# Patient Record
Sex: Female | Born: 1950
Health system: Southern US, Community
[De-identification: ages and names within clinical notes are randomized; demographics above are authoritative.]

## PROBLEM LIST (undated history)

## (undated) DIAGNOSIS — K219 Gastro-esophageal reflux disease without esophagitis: Secondary | ICD-10-CM

## (undated) DIAGNOSIS — T7840XA Allergy, unspecified, initial encounter: Secondary | ICD-10-CM

## (undated) DIAGNOSIS — R7301 Impaired fasting glucose: Secondary | ICD-10-CM

## (undated) DIAGNOSIS — Z5189 Encounter for other specified aftercare: Secondary | ICD-10-CM

## (undated) DIAGNOSIS — R3129 Other microscopic hematuria: Secondary | ICD-10-CM

## (undated) DIAGNOSIS — IMO0001 Reserved for inherently not codable concepts without codable children: Secondary | ICD-10-CM

## (undated) DIAGNOSIS — C801 Malignant (primary) neoplasm, unspecified: Secondary | ICD-10-CM

## (undated) DIAGNOSIS — M1990 Unspecified osteoarthritis, unspecified site: Secondary | ICD-10-CM

## (undated) HISTORY — DX: Unspecified osteoarthritis, unspecified site: M19.90

## (undated) HISTORY — PX: BREAST SURGERY: SHX581

## (undated) HISTORY — PX: TUBAL LIGATION: SHX77

## (undated) HISTORY — DX: Allergy, unspecified, initial encounter: T78.40XA

## (undated) HISTORY — DX: Gastro-esophageal reflux disease without esophagitis: K21.9

## (undated) HISTORY — DX: Other microscopic hematuria: R31.29

## (undated) HISTORY — DX: Encounter for other specified aftercare: Z51.89

## (undated) HISTORY — PX: MASTECTOMY: SHX3

## (undated) HISTORY — DX: Malignant (primary) neoplasm, unspecified: C80.1

## (undated) HISTORY — DX: Impaired fasting glucose: R73.01

## (undated) HISTORY — DX: Reserved for inherently not codable concepts without codable children: IMO0001

---

## 1997-05-10 HISTORY — PX: OTHER SURGICAL HISTORY: SHX169

## 2001-07-10 HISTORY — PX: ABDOMINAL HYSTERECTOMY: SHX81

## 2005-05-11 LAB — HM MAMMOGRAPHY

## 2005-09-14 ENCOUNTER — Ambulatory Visit: Payer: Self-pay | Admitting: Family Medicine

## 2005-09-16 LAB — CONVERTED CEMR LAB
AST: 14 units/L
Cholesterol: 200 mg/dL
Triglycerides: 78 mg/dL

## 2005-09-28 ENCOUNTER — Ambulatory Visit: Payer: Self-pay | Admitting: Family Medicine

## 2005-10-17 ENCOUNTER — Ambulatory Visit: Payer: Self-pay | Admitting: Family Medicine

## 2005-10-18 DIAGNOSIS — N951 Menopausal and female climacteric states: Secondary | ICD-10-CM | POA: Insufficient documentation

## 2005-10-18 DIAGNOSIS — Z853 Personal history of malignant neoplasm of breast: Secondary | ICD-10-CM | POA: Insufficient documentation

## 2005-10-25 ENCOUNTER — Ambulatory Visit: Payer: Self-pay | Admitting: Family Medicine

## 2005-12-21 ENCOUNTER — Ambulatory Visit: Payer: Self-pay | Admitting: Family Medicine

## 2005-12-28 ENCOUNTER — Encounter: Admission: RE | Admit: 2005-12-28 | Discharge: 2006-03-28 | Payer: Self-pay | Admitting: Family Medicine

## 2006-01-05 ENCOUNTER — Encounter: Payer: Self-pay | Admitting: Family Medicine

## 2006-02-01 ENCOUNTER — Ambulatory Visit: Payer: Self-pay | Admitting: Family Medicine

## 2006-03-22 ENCOUNTER — Ambulatory Visit: Payer: Self-pay | Admitting: Family Medicine

## 2006-03-22 DIAGNOSIS — E119 Type 2 diabetes mellitus without complications: Secondary | ICD-10-CM

## 2006-03-22 DIAGNOSIS — E1169 Type 2 diabetes mellitus with other specified complication: Secondary | ICD-10-CM | POA: Insufficient documentation

## 2006-03-22 DIAGNOSIS — IMO0002 Reserved for concepts with insufficient information to code with codable children: Secondary | ICD-10-CM | POA: Insufficient documentation

## 2006-03-22 DIAGNOSIS — E1165 Type 2 diabetes mellitus with hyperglycemia: Secondary | ICD-10-CM | POA: Insufficient documentation

## 2006-03-22 LAB — CONVERTED CEMR LAB
Blood Glucose, Fasting: 122 mg/dL
Hgb A1c MFr Bld: 5.9 %

## 2006-04-10 ENCOUNTER — Ambulatory Visit: Payer: Self-pay | Admitting: Family Medicine

## 2006-04-10 LAB — CONVERTED CEMR LAB
Bilirubin Urine: NEGATIVE
Glucose, Urine, Semiquant: NEGATIVE
Nitrite: NEGATIVE
Urobilinogen, UA: NEGATIVE
WBC Urine, dipstick: NEGATIVE

## 2006-04-14 ENCOUNTER — Telehealth (INDEPENDENT_AMBULATORY_CARE_PROVIDER_SITE_OTHER): Payer: Self-pay | Admitting: *Deleted

## 2006-04-17 ENCOUNTER — Encounter: Payer: Self-pay | Admitting: Family Medicine

## 2006-04-24 ENCOUNTER — Ambulatory Visit: Payer: Self-pay | Admitting: Family Medicine

## 2006-04-24 DIAGNOSIS — R319 Hematuria, unspecified: Secondary | ICD-10-CM | POA: Insufficient documentation

## 2006-04-24 LAB — CONVERTED CEMR LAB
Ketones, urine, test strip: NEGATIVE
Nitrite: NEGATIVE
Specific Gravity, Urine: 1.02
WBC Urine, dipstick: NEGATIVE

## 2006-04-28 ENCOUNTER — Encounter: Payer: Self-pay | Admitting: Family Medicine

## 2006-05-04 ENCOUNTER — Encounter: Payer: Self-pay | Admitting: Family Medicine

## 2006-06-06 ENCOUNTER — Encounter: Payer: Self-pay | Admitting: Family Medicine

## 2006-06-06 LAB — CONVERTED CEMR LAB
Alkaline Phosphatase: 69 units/L
BUN: 16 mg/dL
Glucose, Bld: 96 mg/dL
Sodium: 141 meq/L
Total Bilirubin: 0.6 mg/dL
Total Protein: 7.5 g/dL

## 2006-06-23 ENCOUNTER — Ambulatory Visit: Payer: Self-pay | Admitting: Family Medicine

## 2006-09-22 ENCOUNTER — Ambulatory Visit: Payer: Self-pay | Admitting: Family Medicine

## 2006-09-22 LAB — CONVERTED CEMR LAB
Cholesterol: 191 mg/dL (ref 0–200)
Total CHOL/HDL Ratio: 3
Triglycerides: 88 mg/dL (ref <150)
VLDL: 18 mg/dL (ref 0–40)

## 2006-09-25 ENCOUNTER — Encounter: Payer: Self-pay | Admitting: Family Medicine

## 2006-11-17 ENCOUNTER — Telehealth: Payer: Self-pay | Admitting: Family Medicine

## 2006-11-17 DIAGNOSIS — H9319 Tinnitus, unspecified ear: Secondary | ICD-10-CM | POA: Insufficient documentation

## 2006-12-20 ENCOUNTER — Ambulatory Visit: Payer: Self-pay | Admitting: Family Medicine

## 2006-12-21 LAB — CONVERTED CEMR LAB
MCHC: 34.4 g/dL (ref 30.0–36.0)
Neutro Abs: 4.2 10*3/uL (ref 1.7–7.7)
Neutrophils Relative %: 58 % (ref 43–77)
RDW: 12.6 % (ref 11.5–15.5)

## 2007-01-29 ENCOUNTER — Ambulatory Visit: Payer: Self-pay | Admitting: Family Medicine

## 2007-03-20 ENCOUNTER — Ambulatory Visit: Payer: Self-pay | Admitting: Family Medicine

## 2007-04-11 ENCOUNTER — Encounter: Payer: Self-pay | Admitting: Family Medicine

## 2007-06-19 ENCOUNTER — Encounter: Payer: Self-pay | Admitting: Family Medicine

## 2007-06-19 LAB — CONVERTED CEMR LAB
Albumin: 4.1 g/dL
Alkaline Phosphatase: 56 units/L
CO2: 28 meq/L
Glucose, Bld: 113 mg/dL
Potassium: 4.2 meq/L
Sodium: 141 meq/L
Total Protein: 6.6 g/dL

## 2007-06-21 ENCOUNTER — Ambulatory Visit: Payer: Self-pay | Admitting: Family Medicine

## 2007-11-21 ENCOUNTER — Ambulatory Visit: Payer: Self-pay | Admitting: Family Medicine

## 2007-11-21 DIAGNOSIS — J069 Acute upper respiratory infection, unspecified: Secondary | ICD-10-CM | POA: Insufficient documentation

## 2007-11-28 ENCOUNTER — Telehealth: Payer: Self-pay | Admitting: Family Medicine

## 2007-12-20 ENCOUNTER — Ambulatory Visit: Payer: Self-pay | Admitting: Family Medicine

## 2007-12-20 DIAGNOSIS — R635 Abnormal weight gain: Secondary | ICD-10-CM | POA: Insufficient documentation

## 2007-12-20 LAB — CONVERTED CEMR LAB
Blood Glucose, Fasting: 126 mg/dL
Hgb A1c MFr Bld: 6.1 %
TSH: 2.084 microintl units/mL (ref 0.350–4.50)
Total CHOL/HDL Ratio: 2.9

## 2007-12-24 ENCOUNTER — Encounter: Payer: Self-pay | Admitting: Family Medicine

## 2008-03-26 ENCOUNTER — Ambulatory Visit: Payer: Self-pay | Admitting: Family Medicine

## 2008-03-26 DIAGNOSIS — J019 Acute sinusitis, unspecified: Secondary | ICD-10-CM | POA: Insufficient documentation

## 2008-04-01 ENCOUNTER — Telehealth (INDEPENDENT_AMBULATORY_CARE_PROVIDER_SITE_OTHER): Payer: Self-pay | Admitting: *Deleted

## 2008-04-14 ENCOUNTER — Ambulatory Visit: Payer: Self-pay | Admitting: Family Medicine

## 2008-04-14 DIAGNOSIS — J301 Allergic rhinitis due to pollen: Secondary | ICD-10-CM | POA: Insufficient documentation

## 2008-06-16 ENCOUNTER — Ambulatory Visit: Payer: Self-pay | Admitting: Family Medicine

## 2008-06-16 DIAGNOSIS — J309 Allergic rhinitis, unspecified: Secondary | ICD-10-CM | POA: Insufficient documentation

## 2008-06-16 LAB — CONVERTED CEMR LAB: Blood Glucose, AC Bkfst: 122 mg/dL

## 2008-06-27 ENCOUNTER — Encounter: Payer: Self-pay | Admitting: Family Medicine

## 2008-09-29 ENCOUNTER — Telehealth (INDEPENDENT_AMBULATORY_CARE_PROVIDER_SITE_OTHER): Payer: Self-pay | Admitting: *Deleted

## 2008-11-20 ENCOUNTER — Ambulatory Visit: Payer: Self-pay | Admitting: Family Medicine

## 2008-12-16 ENCOUNTER — Ambulatory Visit: Payer: Self-pay | Admitting: Family Medicine

## 2008-12-30 ENCOUNTER — Encounter: Payer: Self-pay | Admitting: Family Medicine

## 2008-12-31 LAB — CONVERTED CEMR LAB
AST: 17 units/L (ref 0–37)
Albumin: 4.3 g/dL (ref 3.5–5.2)
Alkaline Phosphatase: 58 units/L (ref 39–117)
BUN: 16 mg/dL (ref 6–23)
HDL: 56 mg/dL (ref >39)
LDL Cholesterol: 105 mg/dL — ABNORMAL HIGH (ref 0–99)
Potassium: 4.2 meq/L (ref 3.5–5.3)
TSH: 2.871 microintl units/mL (ref 0.350–4.500)
Total Bilirubin: 0.4 mg/dL (ref 0.3–1.2)
Total CHOL/HDL Ratio: 3.2
VLDL: 16 mg/dL (ref 0–40)

## 2009-02-23 ENCOUNTER — Ambulatory Visit: Payer: Self-pay | Admitting: Family Medicine

## 2009-07-10 ENCOUNTER — Encounter: Payer: Self-pay | Admitting: Family Medicine

## 2010-02-09 NOTE — Letter (Signed)
Summary: [facility]   [facility]   Imported By: Lanelle Bal 07/28/2009 13:47:20  _____________________________________________________________________  External Attachment:    Type:   Image     Comment:   External Document

## 2010-02-09 NOTE — Assessment & Plan Note (Signed)
Summary: LARYNGITIS   Vital Signs:  Patient profile:   60 year old female Height:      67 inches Weight:      180 pounds O2 Sat:      100 % on Room air Temp:     98.1 degrees F oral Pulse rate:   93 / minute BP sitting:   108 / 73  (left arm) Cuff size:   regular  Vitals Entered By: Kathlene November (February 23, 2009 4:23 PM)  O2 Flow:  Room air CC: loosing voice- started out as drainage last Wednesday- today a cough   Primary Care Provider:  Seymour Bars D.O.  CC:  loosing voice- started out as drainage last Wednesday- today a cough.  History of Present Illness: 60 yr old WF presents with hoarseness x5 days. Night irritation with PND. Mild dry cough d/t irritation in the throat. Denies fever, N/V, or nasal congestion. Has mild ST pain.  No rhinorrhea.   Taking Claritin D. Using Nasal spray and throat lozenges.   Current Medications (verified): 1)  Calcium 500 Mg Tabs (Calcium) .... Two Times A Day 2)  Sunkist Vitamin C 500 Mg Chew (Ascorbic Acid) .... Once Daily 3)  Claritin-D 12 Hour 5-120 Mg Xr12h-Tab (Loratadine-Pseudoephedrine) .Marland Kitchen.. 1 Tab By Mouth Q 12 Hrs 4)  Multivitamins  Tabs (Multiple Vitamin) .... Take One Tablet By Mouth Once A Day 5)  Ipratropium Bromide 0.03 % Soln (Ipratropium Bromide) .... 2 Sprays Per Nostril Daily  Allergies (verified): 1)  ! Penicillin 2)  ! Avelox (Moxifloxacin Hcl)  Comments:  Nurse/Medical Assistant: The patient's medications and allergies were reviewed with the patient and were updated in the Medication and Allergy Lists. Kathlene November (February 23, 2009 4:23 PM)  Past History:  Past Medical History: Reviewed history from 06/16/2008 and no changes required. Impaired fasting glucose went to [facility] diabetes nutrition management hx of breast cancer- mammogram at [facility] 5/09; [facility] oncology microscopic hematuria (Dr Retta Diones) - renal ultrasound normal  OB/GYN  Family History: Reviewed history from 10/24/2005 and no changes  required. father died of DM heart dz,  middle brother died of DM,  mother healthy,  younger brother with DM II  Social History: Reviewed history from 06/21/2007 and no changes required. 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  Has 2 grown children.  Lives with daughter and grandson.  and her 2 dogs. Nonsmoker.  Does yardwork, goes to [employer] on occasion.  Not in a relationship.  Review of Systems      See HPI  Physical Exam  General:  alert, well-developed, well-nourished, appropriate dress, normal appearance, and cooperative to examination.   Nose:  no nasal discharge and no mucosal edema.   Mouth:  pharynx pink and moist, no erythema, and no posterior lymphoid hypertrophy.   Neck:  no masses.   Lungs:  Normal respiratory effort, chest expands symmetrically. Lungs are clear to auscultation, no crackles or wheezes. Heart:  Normal rate and regular rhythm. S1 and S2 normal without gallop, murmur, click, rub or other extra sounds. Skin:  color normal.   Cervical Nodes:  No lymphadenopathy noted   Impression & Recommendations:  Problem # 1:  LARYNGITIS-ACUTE (ICD-464.00) Likely viral.   Stop Claritin D, possible cause of dry irritation in throat.  Drink plenty of clear fluids.   Continue use of nasal spray and throat lozenges.  Dexilant 1 capsule by mouth daily to cover for possible acid reflux.  Call if hoarsness has not  improved by the end of the wk.    Complete Medication List: 1)  Calcium 500 Mg Tabs (Calcium) .... Two times a day 2)  Sunkist Vitamin C 500 Mg Chew (Ascorbic acid) .... Once daily 3)  Claritin-d 12 Hour 5-120 Mg Xr12h-tab (Loratadine-pseudoephedrine) .Marland Kitchen.. 1 tab by mouth q 12 hrs 4)  Multivitamins Tabs (Multiple vitamin) .... Take one tablet by mouth once a day 5)  Ipratropium Bromide 0.03 % Soln (Ipratropium bromide) .... 2 sprays per nostril daily  Patient Instructions: 1)  Start Dexilant 1 capsule by mouth daily to cover for possible acid  reflux. 2)  Stop Claritin D. 3)  Drink plenty of clear fluids.   4)  Call if hoarsness has not improved by the end of the wk.

## 2010-04-08 ENCOUNTER — Encounter: Payer: Self-pay | Admitting: Family Medicine

## 2010-04-13 ENCOUNTER — Ambulatory Visit (INDEPENDENT_AMBULATORY_CARE_PROVIDER_SITE_OTHER): Payer: Self-pay | Admitting: Family Medicine

## 2010-04-13 ENCOUNTER — Encounter: Payer: Self-pay | Admitting: Family Medicine

## 2010-04-13 VITALS — BP 108/72 | HR 78 | Temp 98.0°F | Ht 66.0 in | Wt 189.0 lb

## 2010-04-13 DIAGNOSIS — L738 Other specified follicular disorders: Secondary | ICD-10-CM

## 2010-04-13 DIAGNOSIS — L678 Other hair color and hair shaft abnormalities: Secondary | ICD-10-CM

## 2010-04-13 DIAGNOSIS — L739 Follicular disorder, unspecified: Secondary | ICD-10-CM

## 2010-04-13 MED ORDER — MUPIROCIN 2 % EX OINT: TOPICAL_OINTMENT | CUTANEOUS | Status: AC

## 2010-04-13 NOTE — Patient Instructions (Signed)
Clean hand with antibacterial soap and water 3 x a day.  Dry, then apply Bactroban ointment and a bandaid.  Call if lesion has not resolved in 10 days.  Return for f/u with fasting labs in 4 wks.

## 2010-04-13 NOTE — Assessment & Plan Note (Signed)
Will treat tender red new lesion (small) on L dorsum of hand with topical bactroban ointment 3 x a day for the next wk.  If not resolved in 10 days, she will call me for visit to dermatology.  Keep clean and covered.

## 2010-04-13 NOTE — Progress Notes (Signed)
  Subjective:    Patient ID: Janet Mccullough, female    DOB: May 19, 1950, 60 y.o.   MRN: [id number]  HPI 60 yo WF presents for a sore bump on the dorsum of her L hand that appeared about a week ago.  It started off as a 'black dot' but then became sore, red and scabbed.  She denies fevers, chills, bleeding or purulent drainage.  It is a little itchy but she is trying to not scratch it.  She is keeping it clean and using neosporin but it is still tender to touch.  Redness does not seem to be spreading.    BP 108/72  Pulse 78  Temp(Src) 98 F (36.7 C) (Oral)  Ht 5\' 6"  (1.676 m)  Wt 189 lb (85.73 kg)  BMI 30.51 kg/m2  SpO2 98%  Review of Systems  Constitutional: Negative for fever and chills.       Objective:   Physical Exam  Constitutional: She appears well-developed and well-nourished.  Eyes: Conjunctivae are normal.  Neck: Normal range of motion. Neck supple.  Cardiovascular: Normal rate and regular rhythm.   Pulmonary/Chest: Effort normal and breath sounds normal.  Lymphadenopathy:    She has no cervical adenopathy.  Skin: Skin is warm and dry.       0.8 cm round, raised scaley papular lesion with small ring of erythema.  Non fluctuant.  No drainage.  Psychiatric: She has a normal mood and affect.          Assessment & Plan:

## 2010-04-26 ENCOUNTER — Telehealth: Payer: Self-pay | Admitting: *Deleted

## 2010-04-26 DIAGNOSIS — L989 Disorder of the skin and subcutaneous tissue, unspecified: Secondary | ICD-10-CM

## 2010-04-26 NOTE — Telephone Encounter (Signed)
LMOM informing Pt of the above 

## 2010-04-26 NOTE — Telephone Encounter (Signed)
Pt states hand is no better and would like to know if you want to refer her to derm or if you want to see her again. Please advise.

## 2010-04-26 NOTE — Telephone Encounter (Signed)
I reviews our notes and told her that I'd refer her to derm if the bactroban wasn't working.  Referral put in.

## 2010-05-13 ENCOUNTER — Ambulatory Visit (INDEPENDENT_AMBULATORY_CARE_PROVIDER_SITE_OTHER): Payer: BC Managed Care – PPO | Admitting: Family Medicine

## 2010-05-13 ENCOUNTER — Encounter: Payer: Self-pay | Admitting: Family Medicine

## 2010-05-13 DIAGNOSIS — R7301 Impaired fasting glucose: Secondary | ICD-10-CM

## 2010-05-13 DIAGNOSIS — Z13220 Encounter for screening for lipoid disorders: Secondary | ICD-10-CM

## 2010-05-13 DIAGNOSIS — Z130 Encounter for screening for diseases of the blood and blood-forming organs and certain disorders involving the immune mechanism: Secondary | ICD-10-CM

## 2010-05-13 DIAGNOSIS — R635 Abnormal weight gain: Secondary | ICD-10-CM

## 2010-05-13 DIAGNOSIS — C50919 Malignant neoplasm of unspecified site of unspecified female breast: Secondary | ICD-10-CM

## 2010-05-13 DIAGNOSIS — Z13228 Encounter for screening for other metabolic disorders: Secondary | ICD-10-CM

## 2010-05-13 LAB — TSH: TSH: 2.567 u[IU]/mL (ref 0.350–4.500)

## 2010-05-13 LAB — LIPID PANEL: LDL Cholesterol: 128 mg/dL — ABNORMAL HIGH (ref 0–99)

## 2010-05-13 NOTE — Assessment & Plan Note (Signed)
REviewed notes.

## 2010-05-13 NOTE — Patient Instructions (Signed)
Labs downstairs today. Will call you w/ results Fri or Monday.  Keep working on healthy - low sugar/ low carb diet with an exercise goal of 1 hr 4 days /wk.  Return for f/u in 6 mos, sooner if needed.

## 2010-05-13 NOTE — Progress Notes (Signed)
  Subjective:    Patient ID: Janet Mccullough, female    DOB: 1950/05/12, 60 y.o.   MRN: [id number]  HPI 60 yo WF presents for f/u visit.  She is due for fasting labs.  She sees Dr Lowell Guitar at [facility] for oncology f/u annually.She has hx of infiltrating ductal carcinoma of the L breast diagnosed 08-1985, T2N0M0 s/p modified radical mastectomy, ER PR neg and w/o chemo or radiation.  She also had TAH with [facility] in 2003.  She had a normal CBC and CMP at the time of her last visit there.  She is trying to eat healthy and exercise some.  Her weight is unchanged.  Denies CP or DOE.  AM fasting sguars - no checking.    BP 107/72  Pulse 75  Ht 5\' 7"  (1.702 m)  Wt 185 lb (83.915 kg)  BMI 28.97 kg/m2  SpO2 96%     Review of Systems  Constitutional: Negative for fatigue.  Eyes: Negative for visual disturbance.  Respiratory: Negative for shortness of breath.   Cardiovascular: Negative for chest pain, palpitations and leg swelling.  Genitourinary: Negative for difficulty urinating.  Neurological: Negative for headaches.  Psychiatric/Behavioral: Negative for dysphoric mood. The patient is not nervous/anxious.        Objective:   Physical Exam  Constitutional: She appears well-developed and well-nourished. No distress.  HENT:  Head: Normocephalic and atraumatic.  Neck: Neck supple. No thyromegaly present.  Cardiovascular: Normal rate, regular rhythm and normal heart sounds.   Pulmonary/Chest: Effort normal and breath sounds normal. No respiratory distress. She has no wheezes.  Musculoskeletal: She exhibits no edema.  Lymphadenopathy:    She has no cervical adenopathy.  Skin: Skin is warm and dry.  Psychiatric: She has a normal mood and affect.          Assessment & Plan:

## 2010-05-13 NOTE — Assessment & Plan Note (Signed)
Due for f/u labs.  She is working on Engineer, manufacturing wt loss.  Goals given to pt today to work on.  Update labs today.

## 2010-05-14 LAB — COMPLETE METABOLIC PANEL WITH GFR
ALT: 17 U/L (ref 0–35)
CO2: 26 mEq/L (ref 19–32)
Calcium: 9.3 mg/dL (ref 8.4–10.5)
Chloride: 104 mEq/L (ref 96–112)
GFR, Est African American: >60 mL/min (ref >60)
Potassium: 4.5 mEq/L (ref 3.5–5.3)
Sodium: 140 mEq/L (ref 135–145)
Total Protein: 7.3 g/dL (ref 6.0–8.3)

## 2010-05-15 ENCOUNTER — Telehealth: Payer: Self-pay | Admitting: Family Medicine

## 2010-05-15 NOTE — Telephone Encounter (Signed)
Pls let pt know that her cholesterol is OK with a total of 207, LDL of 128 (<130 is goal).  Her sugar is close to the 126 cut off for diabetes at 122.  Thyroid function is normal.

## 2010-05-17 NOTE — Telephone Encounter (Signed)
LMOM informing Pt of the below 

## 2010-07-22 ENCOUNTER — Telehealth: Payer: Self-pay | Admitting: Family Medicine

## 2010-07-22 NOTE — Telephone Encounter (Signed)
Pt called and is experiencing inner ear problems while bending and stooping.  She states she becomes dizzy when doing this. Plan:  Contacted the pt at the work number provided by her, and had to [facility] to instruct the pt to sched an office visit to be seen. Jarvis Newcomer, LPN [facility] Dimes

## 2010-07-23 ENCOUNTER — Telehealth: Payer: Self-pay | Admitting: Family Medicine

## 2010-07-23 NOTE — Telephone Encounter (Signed)
Patient called left message to speak with you and didn't say why

## 2010-07-23 NOTE — Telephone Encounter (Signed)
Pt spoke w/

## 2011-02-25 LAB — HM DEXA SCAN

## 2011-03-17 ENCOUNTER — Encounter: Payer: Self-pay | Admitting: Family Medicine

## 2013-03-13 ENCOUNTER — Emergency Department
Admission: EM | Admit: 2013-03-13 | Discharge: 2013-03-13 | Disposition: A | Discharge status: Home / Self Care | Payer: 59 | Source: Home / Self Care | Attending: Family Medicine | Admitting: Family Medicine

## 2013-03-13 ENCOUNTER — Encounter: Payer: Self-pay | Admitting: Emergency Medicine

## 2013-03-13 DIAGNOSIS — J320 Chronic maxillary sinusitis: Secondary | ICD-10-CM

## 2013-03-13 MED ORDER — AZITHROMYCIN 250 MG PO TABS: ORAL_TABLET | ORAL | Status: DC

## 2013-03-13 NOTE — ED Provider Notes (Signed)
CSN: [id number]     Arrival date & time 03/13/13  1321 History   First MD Initiated Contact with Patient 03/13/13 1354     Chief Complaint  Patient presents with  . Sinus Problem      HPI Comments: Patient reports that 3 weeks ago she developed URI symptoms with a mild sore throat, sinus congestion, low grade fever, fatigue, and myalgias.  She had a cough for several days and all symptoms resolved except for mild sinus congestion that has persisted.  Two days ago her congestion increased, worse on the left, and she developed a mild sore throat and increased fatigue.  The history is provided by the patient.    Past Medical History  Diagnosis Date  . Impaired fasting glucose   . Cancer     breast  . Microscopic hematuria     renal ultrasound normal (Dr Diona Fanti)  . Normal echocardiogram     EF 60-65%   Past Surgical History  Procedure Laterality Date  . Abdominal hysterectomy  7/03    for DUB/ cysts  . Masectomy    . Uterine cyst removed  5/99   Family History  Problem Relation Age of Onset  . Diabetes Father   . Heart disease Father   . Diabetes Brother   . Diabetes Brother     type 2   History  Substance Use Topics  . Smoking status: Never Smoker   . Smokeless tobacco: Never Used  . Alcohol Use: No   OB History   Grav Para Term Preterm Abortions TAB SAB Ect Mult Living                 Review of Systems + mild sore throat + cough No pleuritic pain No wheezing + nasal congestion + post-nasal drainage + sinus pain/pressure No itchy/red eyes ? earache No hemoptysis No SOB No fever/chills No nausea No vomiting No abdominal pain No diarrhea No urinary symptoms No skin rash + fatigue + myalgias No headache Used OTC meds without relief  Allergies  Moxifloxacin and Penicillins  Home Medications   Current Outpatient Rx  Name  Route  Sig  Dispense  Refill  . Ascorbic Acid (SUNKIST VITAMIN C PO)   Oral   Take 500 mg by mouth daily.           Marland Kitchen  azithromycin (ZITHROMAX Z-PAK) 250 MG tablet      Take 2 tabs today; then begin one tab once daily for 4 more days.   6 each   0   . Calcium Carbonate (CALCIUM 500 PO)   Oral   Take by mouth 2 (two) times daily.           . cetirizine (ZYRTEC) 10 MG tablet   Oral   Take 10 mg by mouth daily.           Marland Kitchen ipratropium (ATROVENT) 0.03 % nasal spray   Nasal   2 sprays by Nasal route daily.           . multivitamin (THERAGRAN) per tablet   Oral   Take 1 tablet by mouth daily.            BP 109/73  Pulse 85  Temp(Src) 98 F (36.7 C) (Oral)  Resp 14  Ht 5' 6.5" (1.689 m)  Wt 186 lb (84.369 kg)  BMI 29.57 kg/m2  SpO2 99% Physical Exam Nursing notes and Vital Signs reviewed. Appearance:  Patient appears healthy, stated age, and in no  acute distress Eyes:  Pupils are equal, round, and reactive to light and accomodation.  Extraocular movement is intact.  Conjunctivae are not inflamed  Ears:  Canals normal.  Left tympanic membrane is slightly erythematous and has decreased landmarks  Nose:  Congested turbinates, more pronounced on the left.  Left maxillary sinus tenderness is present.  Pharynx:  Normal Neck:  Supple.   Non-tender shotty posterior nodes are palpated bilaterally  Lungs:  Clear to auscultation.  Breath sounds are equal.  Heart:  Regular rate and rhythm without murmurs, rubs, or gallops.  Abdomen:  Nontender without masses or hepatosplenomegaly.  Bowel sounds are present.  No CVA or flank tenderness.  Extremities:  No edema.  No calf tenderness Skin:  No rash present.   ED Course  Procedures  none   Labs Reviewed - Tympanogram:  Wide in left ear; "noisy" right ear, otherwise normal      MDM   1. Left maxillary sinusitis; suspect developing viral URI also     Begin Z-pack. Take plain Mucinex (1200 mg guaifenesin) twice daily for cough and congestion.  Add Sudafed for sinus congestion.   Increase fluid intake, rest. May use Afrin nasal spray (or  generic oxymetazoline) twice daily for about 5 days.  Also recommend using saline nasal spray several times daily and saline nasal irrigation (AYR is a common brand) If cough develops, may take Delsym Cough Suppressant at bedtime for nighttime cough.  Stop all antihistamines for now, and other non-prescription cough/cold preparations. Followup with ENT if not improving    Kandra Nicolas, MD 03/14/13 2017

## 2013-03-13 NOTE — ED Notes (Signed)
Autie c/o sinus pain and congestion with fatigue x 3 weeks. Denies fever. No flu vac this season.

## 2013-03-13 NOTE — Discharge Instructions (Signed)
Take plain Mucinex (1200 mg guaifenesin) twice daily for cough and congestion.  Add Sudafed for sinus congestion.   Increase fluid intake, rest. May use Afrin nasal spray (or generic oxymetazoline) twice daily for about 5 days.  Also recommend using saline nasal spray several times daily and saline nasal irrigation (AYR is a common brand) If cough develops, may take Delsym Cough Suppressant at bedtime for nighttime cough.  Stop all antihistamines for now, and other non-prescription cough/cold preparations.

## 2013-07-26 LAB — HM MAMMOGRAPHY

## 2013-10-28 ENCOUNTER — Other Ambulatory Visit: Payer: Self-pay | Admitting: Family Medicine

## 2013-10-28 ENCOUNTER — Encounter: Payer: Self-pay | Admitting: Family Medicine

## 2013-10-28 ENCOUNTER — Ambulatory Visit (INDEPENDENT_AMBULATORY_CARE_PROVIDER_SITE_OTHER): Payer: 59 | Admitting: Family Medicine

## 2013-10-28 ENCOUNTER — Ambulatory Visit (INDEPENDENT_AMBULATORY_CARE_PROVIDER_SITE_OTHER): Payer: 59

## 2013-10-28 VITALS — BP 117/66 | HR 91 | Ht 67.0 in | Wt 188.0 lb

## 2013-10-28 DIAGNOSIS — S62609D Fracture of unspecified phalanx of unspecified finger, subsequent encounter for fracture with routine healing: Secondary | ICD-10-CM

## 2013-10-28 DIAGNOSIS — W19XXXD Unspecified fall, subsequent encounter: Secondary | ICD-10-CM

## 2013-10-28 DIAGNOSIS — J069 Acute upper respiratory infection, unspecified: Secondary | ICD-10-CM

## 2013-10-28 NOTE — Progress Notes (Addendum)
   Subjective:    Patient ID: Janet Mccullough, female    DOB: 10-11-1950, 63 y.o.   MRN: [id number]  HPI she was seen at [facility] she fell and suffered a sprain to her L hand and a closed fx to 5th metacarpal she was placed in a splint. She feel and land on side of outstretched hand.   Sinus pressure and congestion x 5 days with some right tear pain. No fever chills, or sweats. Started using her nasal spray, so far no relief. No ST or HA.    Review of Systems     Objective:   Physical Exam  Constitutional: She is oriented to person, place, and time. She appears well-developed and well-nourished.  HENT:  Head: Normocephalic and atraumatic.  Right Ear: External ear normal.  Left Ear: External ear normal.  Nose: Nose normal.  Mouth/Throat: Oropharynx is clear and moist.  TMs and canals are clear.   Eyes: Conjunctivae and EOM are normal. Pupils are equal, round, and reactive to light.  Neck: Neck supple. No thyromegaly present.  Cardiovascular: Normal rate, regular rhythm and normal heart sounds.   Pulmonary/Chest: Effort normal and breath sounds normal. She has no wheezes.  Lymphadenopathy:    She has no cervical adenopathy.  Neurological: She is alert and oriented to person, place, and time.  Skin: Skin is warm and dry.  Psychiatric: She has a normal mood and affect.     Left hand is still tender over the proximal 5th phalanx.  NROM of the finger and wrist.       Assessment & Plan:  5th proximal phalanx fracture, left hand- will repeat xray to confirm healing.  Contiue to wear splint for now until we call her with results.  Need to call and get her bone density test since technically a low impact fracture.   URI - likely viral if not better in 2-3 days call us back and I will send in an antibiotic for acute sinusitis.

## 2013-10-29 ENCOUNTER — Telehealth: Payer: Self-pay | Admitting: *Deleted

## 2013-10-29 DIAGNOSIS — M8580 Other specified disorders of bone density and structure, unspecified site: Secondary | ICD-10-CM | POA: Insufficient documentation

## 2013-10-29 NOTE — Telephone Encounter (Signed)
Opened for care everywhere.Janet Mccullough

## 2013-11-14 ENCOUNTER — Ambulatory Visit (INDEPENDENT_AMBULATORY_CARE_PROVIDER_SITE_OTHER): Payer: 59 | Admitting: Family Medicine

## 2013-11-14 ENCOUNTER — Encounter: Payer: Self-pay | Admitting: Family Medicine

## 2013-11-14 ENCOUNTER — Ambulatory Visit (INDEPENDENT_AMBULATORY_CARE_PROVIDER_SITE_OTHER): Payer: 59

## 2013-11-14 VITALS — BP 103/63 | HR 89 | Ht 67.0 in | Wt 191.0 lb

## 2013-11-14 DIAGNOSIS — S62609D Fracture of unspecified phalanx of unspecified finger, subsequent encounter for fracture with routine healing: Secondary | ICD-10-CM

## 2013-11-14 DIAGNOSIS — M8580 Other specified disorders of bone density and structure, unspecified site: Secondary | ICD-10-CM

## 2013-11-14 DIAGNOSIS — S62617D Displaced fracture of proximal phalanx of left little finger, subsequent encounter for fracture with routine healing: Secondary | ICD-10-CM

## 2013-11-14 NOTE — Progress Notes (Signed)
   Subjective:    Patient ID: Janet Mccullough, female    DOB: 1950/07/23, 63 y.o.   MRN: [id number]  HPI F/u finger fracture. Has been wearing the splint. Pain has been minimal. Swelling is mostly gone.  Has normal ROM.  Has used ice and IBU.  Patient did have a low impact fracture. She did have a bone density scan were formed in the porta 2013. It did show some osteopenia with T score of -0.7 in the lumbar spine and -1.1 in the femoral neck.  Review of Systems     Objective:   Physical Exam  Constitutional: She is oriented to person, place, and time. She appears well-developed and well-nourished.  Musculoskeletal:  Left hand with NROM. With closed fist she cannot fullly close the 5th digit on the left hand compared to the right.  She has good strength. nontender over the MCP joint or wrist.  Strength is normal  Neurological: She is alert and oriented to person, place, and time.  Skin: Skin is warm and dry.  Psychiatric: She has a normal mood and affect.          Assessment & Plan:  F/u finger fracture.  She is doing really well overall.no pain. Fairly good range of motion. The she does have some difficulty with completely closing and making a fist with that pinky digit.  We'll get repeat x-rays today just to make sure that everything is healing well. Hopefully we can get her out of her splint. Next  Osteopenia-we did get her report from her OB/GYN. Please see note above. We did recommend calcium supplementation in addition to vitamin D. She does take a vitamin D supplement. That she's not sure about the dose on it. She says she is more interested in trying increasing her calcium in her diet which is perfectly fine. I do recommend that she repeat her bone density in the next couple of years.

## 2013-11-23 ENCOUNTER — Encounter: Payer: Self-pay | Admitting: Family Medicine

## 2013-12-10 ENCOUNTER — Telehealth: Payer: Self-pay | Admitting: *Deleted

## 2013-12-23 NOTE — Telephone Encounter (Signed)
Error

## 2014-07-29 ENCOUNTER — Encounter: Payer: Self-pay | Admitting: Family Medicine

## 2014-07-29 ENCOUNTER — Ambulatory Visit (INDEPENDENT_AMBULATORY_CARE_PROVIDER_SITE_OTHER): Payer: 59 | Admitting: Family Medicine

## 2014-07-29 VITALS — BP 116/67 | HR 84 | Ht 67.0 in | Wt 186.0 lb

## 2014-07-29 DIAGNOSIS — Z1159 Encounter for screening for other viral diseases: Secondary | ICD-10-CM

## 2014-07-29 DIAGNOSIS — Z13220 Encounter for screening for lipoid disorders: Secondary | ICD-10-CM

## 2014-07-29 DIAGNOSIS — Z114 Encounter for screening for human immunodeficiency virus [HIV]: Secondary | ICD-10-CM | POA: Diagnosis not present

## 2014-07-29 DIAGNOSIS — R7301 Impaired fasting glucose: Secondary | ICD-10-CM

## 2014-07-29 DIAGNOSIS — R5383 Other fatigue: Secondary | ICD-10-CM

## 2014-07-29 LAB — CBC WITH DIFFERENTIAL/PLATELET
BASOS ABS: 0.1 10*3/uL (ref 0.0–0.1)
Basophils Relative: 1 % (ref 0–1)
EOS ABS: 0.2 10*3/uL (ref 0.0–0.7)
Eosinophils Relative: 3 % (ref 0–5)
HCT: 42.3 % (ref 36.0–46.0)
Hemoglobin: 14.4 g/dL (ref 12.0–15.0)
LYMPHS PCT: 38 % (ref 12–46)
Lymphs Abs: 2.2 10*3/uL (ref 0.7–4.0)
MCH: 31 pg (ref 26.0–34.0)
MCHC: 34 g/dL (ref 30.0–36.0)
MCV: 91.2 fL (ref 78.0–100.0)
MPV: 11.1 fL (ref 8.6–12.4)
Monocytes Absolute: 0.4 10*3/uL (ref 0.1–1.0)
Monocytes Relative: 7 % (ref 3–12)
Neutro Abs: 2.9 10*3/uL (ref 1.7–7.7)
Neutrophils Relative %: 51 % (ref 43–77)
Platelets: 277 10*3/uL (ref 150–400)
RBC: 4.64 MIL/uL (ref 3.87–5.11)
RDW: 13.9 % (ref 11.5–15.5)
WBC: 5.7 10*3/uL (ref 4.0–10.5)

## 2014-07-29 LAB — COMPLETE METABOLIC PANEL WITH GFR
ALK PHOS: 52 U/L (ref 39–117)
ALT: 16 U/L (ref 0–35)
AST: 14 U/L (ref 0–37)
Albumin: 4.3 g/dL (ref 3.5–5.2)
BUN: 20 mg/dL (ref 6–23)
CALCIUM: 9.5 mg/dL (ref 8.4–10.5)
CO2: 26 mEq/L (ref 19–32)
CREATININE: 0.74 mg/dL (ref 0.50–1.10)
Chloride: 102 mEq/L (ref 96–112)
GFR, EST NON AFRICAN AMERICAN: 86 mL/min
GFR, Est African American: >89 mL/min
Glucose, Bld: 126 mg/dL — ABNORMAL HIGH (ref 70–99)
POTASSIUM: 4.3 meq/L (ref 3.5–5.3)
SODIUM: 140 meq/L (ref 135–145)
TOTAL PROTEIN: 7.3 g/dL (ref 6.0–8.3)
Total Bilirubin: 0.8 mg/dL (ref 0.2–1.2)

## 2014-07-29 LAB — LIPID PANEL
Cholesterol: 179 mg/dL (ref 0–200)
HDL: 61 mg/dL (ref >=46)
LDL CALC: 96 mg/dL (ref 0–99)
TRIGLYCERIDES: 112 mg/dL (ref <150)
Total CHOL/HDL Ratio: 2.9 Ratio
VLDL: 22 mg/dL (ref 0–40)

## 2014-07-29 LAB — FERRITIN: FERRITIN: 257 ng/mL (ref 10–291)

## 2014-07-29 LAB — VITAMIN B12: Vitamin B-12: 692 pg/mL (ref 211–911)

## 2014-07-29 LAB — POCT GLYCOSYLATED HEMOGLOBIN (HGB A1C): HEMOGLOBIN A1C: 6.4

## 2014-07-29 NOTE — Progress Notes (Signed)
Subjective:    Patient ID: Janet Mccullough, female    DOB: 12/27/1950, 64 y.o.   MRN: [id number]  HPI IFG - hasn't been seen for regular care here in our office since 2012. She did come in for an acute finger injury back in 2015. No increased thirst or urination. She had she does check her blood sugars at home. She checks them 3 times a week, 2 fasting and 1 after a meal. She has't been exercising.    Gets mamm[facility] done at [facility]. Will update her chart.   FAtigue - has felt more fatigued lately.  She wants to be checked for low iron.  She wonders if sugar are high too.  Not vegetarian. No vaginal bleeding. No blood in the stool. No recent illnesses.   Review of Systems  BP 116/67 mmHg  Pulse 84  Ht 5\' 7"  (1.702 m)  Wt 186 lb (84.369 kg)  BMI 29.12 kg/m2    Allergies  Allergen Reactions  . Moxifloxacin Hcl In Nacl Hives  . Penicillins Hives    REACTION: unknown rx  . Quinolones Hives    In particular Avelox    Past Medical History  Diagnosis Date  . Impaired fasting glucose   . Cancer     breast  . Microscopic hematuria     renal ultrasound normal (Dr Diona Fanti)  . Normal echocardiogram     EF 60-65%    Past Surgical History  Procedure Laterality Date  . Abdominal hysterectomy  7/03    for DUB/ cysts  . Masectomy    . Uterine cyst removed  5/99    History   Social History  . Marital Status: Widowed    Spouse Name: N/A  . Number of Children: N/A  . Years of Education: N/A   Occupational History  . Not on file.   Social History Main Topics  . Smoking status: Never Smoker   . Smokeless tobacco: Never Used  . Alcohol Use: No  . Drug Use: No  . Sexual Activity: Not on file   Other Topics Concern  . Not on file   Social History Narrative    Family History  Problem Relation Age of Onset  . Diabetes Father   . Heart disease Father   . Diabetes Brother   . Diabetes Brother     type 2    Outpatient Encounter Prescriptions as of 07/29/2014   Medication Sig  . Ascorbic Acid (SUNKIST VITAMIN C PO) Take 500 mg by mouth daily.    . cetirizine (ZYRTEC) 10 MG tablet Take 10 mg by mouth daily.    . cholecalciferol (VITAMIN D) 1000 UNITS tablet Take 1,000 Units by mouth daily.  Marland Kitchen ipratropium (ATROVENT) 0.03 % nasal spray 2 sprays by Nasal route daily.    . multivitamin (THERAGRAN) per tablet Take 1 tablet by mouth daily.     No facility-administered encounter medications on file as of 07/29/2014.          Objective:   Physical Exam  Constitutional: She is oriented to person, place, and time. She appears well-developed and well-nourished.  HENT:  Head: Normocephalic and atraumatic.  Cardiovascular: Normal rate, regular rhythm and normal heart sounds.   Pulmonary/Chest: Effort normal and breath sounds normal.  Neurological: She is alert and oriented to person, place, and time.  Skin: Skin is warm and dry.  Psychiatric: She has a normal mood and affect. Her behavior is normal.  Assessment & Plan:  IFG - borderline for diabetes. Discussed getting back on track with diet and exercise.  Also discussed going on metformin. She had tried it beter and caused some diarrhea.   Fatigue- we will evaluate for anemia and B12 deficiency. May also be because sugars have been more elevated than usual.  Tdap - declined.    colonoscopy declined.  Oncologist usually does her fecal occult cards. Disccussed Cologuard.

## 2014-07-30 LAB — HIV ANTIBODY (ROUTINE TESTING W REFLEX): HIV: NONREACTIVE

## 2014-07-30 LAB — HEPATITIS C ANTIBODY: HCV Ab: NEGATIVE

## 2014-08-01 ENCOUNTER — Other Ambulatory Visit: Payer: Self-pay | Admitting: *Deleted

## 2014-08-01 DIAGNOSIS — R7301 Impaired fasting glucose: Secondary | ICD-10-CM

## 2014-08-01 MED ORDER — LANCETS MISC. MISC: Status: DC

## 2014-08-01 MED ORDER — AMBULATORY NON FORMULARY MEDICATION: Status: DC

## 2014-08-05 ENCOUNTER — Encounter: Payer: Self-pay | Admitting: *Deleted

## 2014-08-06 ENCOUNTER — Encounter: Payer: Self-pay | Admitting: Family Medicine

## 2014-08-11 ENCOUNTER — Other Ambulatory Visit: Payer: Self-pay | Admitting: *Deleted

## 2014-08-11 DIAGNOSIS — R7301 Impaired fasting glucose: Secondary | ICD-10-CM

## 2014-08-11 MED ORDER — AMBULATORY NON FORMULARY MEDICATION: Status: DC

## 2014-08-12 ENCOUNTER — Encounter: Payer: Self-pay | Admitting: *Deleted

## 2014-08-12 ENCOUNTER — Telehealth: Payer: Self-pay | Admitting: Family Medicine

## 2014-08-12 NOTE — Telephone Encounter (Signed)
Received fax for prior authorization on Breeze 2 Tes Disc sent through cover my meds waiting on authorization. - CF

## 2014-08-14 ENCOUNTER — Other Ambulatory Visit: Payer: Self-pay | Admitting: *Deleted

## 2014-08-14 MED ORDER — ONETOUCH ULTRA SYSTEM W/DEVICE KIT: 1.0000 | PACK | Freq: Once | Status: AC

## 2014-08-14 NOTE — Telephone Encounter (Signed)
New Rx for meter and testing supplies sent.Janet Mccullough, Janet Mccullough

## 2014-08-25 NOTE — Telephone Encounter (Signed)
[employer] denied coverage on test strips. - CF

## 2015-01-29 ENCOUNTER — Ambulatory Visit (INDEPENDENT_AMBULATORY_CARE_PROVIDER_SITE_OTHER): Payer: 59 | Admitting: Family Medicine

## 2015-01-29 ENCOUNTER — Encounter: Payer: Self-pay | Admitting: Family Medicine

## 2015-01-29 VITALS — BP 131/65 | HR 79 | Ht 67.0 in | Wt 195.0 lb

## 2015-01-29 DIAGNOSIS — R7301 Impaired fasting glucose: Secondary | ICD-10-CM

## 2015-01-29 DIAGNOSIS — E669 Obesity, unspecified: Secondary | ICD-10-CM | POA: Diagnosis not present

## 2015-01-29 DIAGNOSIS — Z6830 Body mass index (BMI) 30.0-30.9, adult: Secondary | ICD-10-CM | POA: Diagnosis not present

## 2015-01-29 DIAGNOSIS — E1165 Type 2 diabetes mellitus with hyperglycemia: Secondary | ICD-10-CM

## 2015-01-29 DIAGNOSIS — IMO0001 Reserved for inherently not codable concepts without codable children: Secondary | ICD-10-CM

## 2015-01-29 LAB — POCT GLYCOSYLATED HEMOGLOBIN (HGB A1C): HEMOGLOBIN A1C: 7.3

## 2015-01-29 MED ORDER — METFORMIN HCL ER 500 MG PO TB24: 500.0000 mg | ORAL_TABLET | Freq: Every day | ORAL | Status: DC

## 2015-01-29 NOTE — Progress Notes (Signed)
   Subjective:    Patient ID: Janet Mccullough, female    DOB: [birth year], 65 y.o.   MRN: [id number]  HPI IFG - No inc thirst or urination.  Has wanted to start an exercise regimen. Started a supplement for diabetes about a month ago.    Obesity - She has actually gained about 9 pounds since she was last here 6 months ago. She knows she needs to get her weight under better control.   Review of Systems     Objective:   Physical Exam  Constitutional: She is oriented to person, place, and time. She appears well-developed and well-nourished.  HENT:  Head: Normocephalic and atraumatic.  Cardiovascular: Normal rate, regular rhythm and normal heart sounds.   Pulmonary/Chest: Effort normal and breath sounds normal.  Neurological: She is alert and oriented to person, place, and time.  Skin: Skin is warm and dry.  Psychiatric: She has a normal mood and affect. Her behavior is normal.          Assessment & Plan:  DM- New dx.  She already has glucometer.  Start metformin. Discussed benefit and potential S.E.  F/U in 3 months. Will be due for pneumonia vaccine  Obesity/BMI of 30-discussed the importance of diet and exercise and getting back on track. I think starting metformin would be helpful as well. I will see her back in 3 months.  Declined flu shot and tetanus and shingesl vaccines.

## 2015-02-09 ENCOUNTER — Other Ambulatory Visit: Payer: Self-pay | Admitting: Family Medicine

## 2015-03-06 ENCOUNTER — Other Ambulatory Visit: Payer: Self-pay | Admitting: Family Medicine

## 2015-04-29 ENCOUNTER — Ambulatory Visit (INDEPENDENT_AMBULATORY_CARE_PROVIDER_SITE_OTHER): Payer: 59 | Admitting: Family Medicine

## 2015-04-29 ENCOUNTER — Encounter: Payer: Self-pay | Admitting: Family Medicine

## 2015-04-29 VITALS — BP 114/62 | HR 75 | Wt 191.0 lb

## 2015-04-29 DIAGNOSIS — E1165 Type 2 diabetes mellitus with hyperglycemia: Secondary | ICD-10-CM | POA: Diagnosis not present

## 2015-04-29 DIAGNOSIS — M8580 Other specified disorders of bone density and structure, unspecified site: Secondary | ICD-10-CM | POA: Diagnosis not present

## 2015-04-29 DIAGNOSIS — IMO0001 Reserved for inherently not codable concepts without codable children: Secondary | ICD-10-CM

## 2015-04-29 LAB — COMPLETE METABOLIC PANEL WITH GFR
ALBUMIN: 4.3 g/dL (ref 3.6–5.1)
ALK PHOS: 59 U/L (ref 33–130)
ALT: 19 U/L (ref 6–29)
AST: 14 U/L (ref 10–35)
BILIRUBIN TOTAL: 0.8 mg/dL (ref 0.2–1.2)
BUN: 14 mg/dL (ref 7–25)
CALCIUM: 9.3 mg/dL (ref 8.6–10.4)
CO2: 27 mmol/L (ref 20–31)
Chloride: 102 mmol/L (ref 98–110)
Creat: 0.83 mg/dL (ref 0.50–0.99)
GFR, EST AFRICAN AMERICAN: 86 mL/min (ref >=60)
GFR, EST NON AFRICAN AMERICAN: 74 mL/min (ref >=60)
GLUCOSE: 145 mg/dL — AB (ref 65–99)
POTASSIUM: 4.2 mmol/L (ref 3.5–5.3)
Sodium: 138 mmol/L (ref 135–146)
TOTAL PROTEIN: 7 g/dL (ref 6.1–8.1)

## 2015-04-29 LAB — POCT UA - MICROALBUMIN
Albumin/Creatinine Ratio, Urine, POC: <30
Creatinine, POC: 300 mg/dL
MICROALBUMIN (UR) POC: 30 mg/L

## 2015-04-29 LAB — LIPID PANEL
CHOL/HDL RATIO: 2.8 ratio (ref <=5.0)
CHOLESTEROL: 179 mg/dL (ref 125–200)
HDL: 65 mg/dL (ref >=46)
LDL Cholesterol: 93 mg/dL (ref <130)
Triglycerides: 103 mg/dL (ref <150)
VLDL: 21 mg/dL (ref <30)

## 2015-04-29 LAB — POCT GLYCOSYLATED HEMOGLOBIN (HGB A1C): HEMOGLOBIN A1C: 6.8

## 2015-04-29 MED ORDER — METFORMIN HCL ER 500 MG PO TB24: 500.0000 mg | ORAL_TABLET | Freq: Two times a day (BID) | ORAL | Status: DC

## 2015-04-29 MED ORDER — METFORMIN HCL ER 500 MG PO TB24: 500.0000 mg | ORAL_TABLET | Freq: Every day | ORAL | Status: DC

## 2015-04-29 NOTE — Assessment & Plan Note (Signed)
Today A1c is down to 6.8 which is fantastic. We will work on trying to increase the metformin dose. We also discussed getting on track with regular exercise and continuing to try to monitor the diet and how it affects her blood sugars. She declines to take a statin. We discussed the potential benefits but she is not interested. Can hopefully get her eye exam updated in the chart. Follow-up in 3 months.

## 2015-04-29 NOTE — Assessment & Plan Note (Signed)
She is a diagnosis of osteopenia with DEXA scan performed 4 years ago. She said that she was told that it didn't need to be repeated for about 10 years. I question whether or not this is accurate especially since the test was abnormal. I told her that I would poor her original report and let her know based on current guidelines when it needs to be repeated. Continue with calcium and vitamin D supplementation.

## 2015-04-29 NOTE — Progress Notes (Signed)
   Subjective:    Patient ID: Janet Mccullough, female    DOB: 14-May-1950, 65 y.o.   MRN: [id number]  HPI Diabetes - no hypoglycemic events. No wounds or sores that are not healing well. No increased thirst or urination. Checking glucose at home. Taking medications as prescribed without any side effects.She did bring in her log with her. Most of her fasting blood sugars are running in the 140s and 150s. She's really been trying to track what she is eating to see what's triggering the sugars. She did go for nutrition counseling. She is taking her metformin regularly without any side effects. She says she starting to get into some more regular exercise mostly walking but isn't consistent with it. She did have an eye exam last August and called yesterday to have that faxed to our office.   Review of Systems     Objective:   Physical Exam  Constitutional: She is oriented to person, place, and time. She appears well-developed and well-nourished.  HENT:  Head: Normocephalic and atraumatic.  Cardiovascular: Normal rate, regular rhythm and normal heart sounds.   Pulmonary/Chest: Effort normal and breath sounds normal.  Neurological: She is alert and oriented to person, place, and time.  Skin: Skin is warm and dry.  Psychiatric: She has a normal mood and affect. Her behavior is normal.        Assessment & Plan:     Declines pneumonia vaccine.

## 2015-04-30 ENCOUNTER — Other Ambulatory Visit: Payer: Self-pay | Admitting: *Deleted

## 2015-04-30 DIAGNOSIS — E1165 Type 2 diabetes mellitus with hyperglycemia: Principal | ICD-10-CM

## 2015-04-30 DIAGNOSIS — IMO0001 Reserved for inherently not codable concepts without codable children: Secondary | ICD-10-CM

## 2015-04-30 MED ORDER — METFORMIN HCL ER 500 MG PO TB24: 500.0000 mg | ORAL_TABLET | Freq: Two times a day (BID) | ORAL | Status: DC

## 2015-04-30 NOTE — Progress Notes (Signed)
Quick Note:  All labs are normal. ______ 

## 2015-05-04 ENCOUNTER — Telehealth: Payer: Self-pay | Admitting: Family Medicine

## 2015-05-04 NOTE — Telephone Encounter (Signed)
Attempted to contact Pt, no answer. Left VM to return clinic call.  

## 2015-05-04 NOTE — Telephone Encounter (Signed)
Call patient: I did review her last bone density report from February 2013. Because her lowest T score was 1.1 in the lumbar spine and did investigate to see when she is due for repeat scan. It is 10 years so she will be due in 2023.

## 2015-05-06 NOTE — Telephone Encounter (Signed)
lvm w/recommendations.Janet Mccullough  

## 2015-07-29 ENCOUNTER — Encounter: Payer: Self-pay | Admitting: Family Medicine

## 2015-07-29 ENCOUNTER — Ambulatory Visit (INDEPENDENT_AMBULATORY_CARE_PROVIDER_SITE_OTHER): Payer: 59 | Admitting: Family Medicine

## 2015-07-29 VITALS — BP 115/57 | HR 77 | Wt 187.0 lb

## 2015-07-29 DIAGNOSIS — M8580 Other specified disorders of bone density and structure, unspecified site: Secondary | ICD-10-CM | POA: Diagnosis not present

## 2015-07-29 DIAGNOSIS — E1165 Type 2 diabetes mellitus with hyperglycemia: Secondary | ICD-10-CM

## 2015-07-29 DIAGNOSIS — R5383 Other fatigue: Secondary | ICD-10-CM | POA: Diagnosis not present

## 2015-07-29 DIAGNOSIS — Z1231 Encounter for screening mammogram for malignant neoplasm of breast: Secondary | ICD-10-CM

## 2015-07-29 DIAGNOSIS — R0602 Shortness of breath: Secondary | ICD-10-CM

## 2015-07-29 DIAGNOSIS — R7989 Other specified abnormal findings of blood chemistry: Secondary | ICD-10-CM

## 2015-07-29 DIAGNOSIS — IMO0001 Reserved for inherently not codable concepts without codable children: Secondary | ICD-10-CM

## 2015-07-29 LAB — CBC
HCT: 41.2 % (ref 35.0–45.0)
HEMOGLOBIN: 14 g/dL (ref 11.7–15.5)
MCH: 30.7 pg (ref 27.0–33.0)
MCHC: 34 g/dL (ref 32.0–36.0)
MCV: 90.4 fL (ref 80.0–100.0)
MPV: 11.4 fL (ref 7.5–12.5)
Platelets: 271 10*3/uL (ref 140–400)
RBC: 4.56 MIL/uL (ref 3.80–5.10)
RDW: 13.7 % (ref 11.0–15.0)
WBC: 5.3 10*3/uL (ref 3.8–10.8)

## 2015-07-29 LAB — HEMOGLOBIN A1C
HEMOGLOBIN A1C: 6.5 % — AB (ref <5.7)
Mean Plasma Glucose: 140 mg/dL

## 2015-07-29 LAB — TSH: TSH: 2.6 m[IU]/L

## 2015-07-29 LAB — FERRITIN: Ferritin: 240 ng/mL (ref 20–288)

## 2015-07-29 LAB — VITAMIN B12: Vitamin B-12: 323 pg/mL (ref 200–1100)

## 2015-07-29 NOTE — Progress Notes (Signed)
Subjective:    CC: DM  HPI: Diabetes - no hypoglycemic events. No wounds or sores that are not healing well. No increased thirst or urination. Checking glucose at home. Taking medications as prescribed without any side effects. She has called to schedule her eye appt. She did try increasing the metformin to twice a day but says she felt very tired on it so went back down to once a day. She actually tried stopping it for a week while she was on vacation just to see if that was what was making her feel poorly, but she really didn't notice a big difference.  Ok to schedule mammogram. She does have a prior history of breast cancer but she is back to screening mammograms.  Plans to schedule colonoscopy later this year.  She wants to wait until her Medicare kicks in. Declines all vaccinations today.  Her biggest concern today is that she has extremely low energy. She says she's noticed it for several months. She feels like she sleeps well overall. She does have dogs and wonders if she could've suffered a tick bite. She would particularly like to be tested for Lyme's disease. She's had no prior problems with her thyroid or with anemia. She's also had a couple of episodes where she felt very short of breath. It resolved fairly quickly though. She did not experience any chest pain with it. She had a full cardiac workup including EKG, stress test and echocardiogram a few years ago. She's had no prior problems of pulmonary disease. No asthma as a child. She has never smoked.    Past medical history, Surgical history, Family history not pertinant except as noted below, Social history, Allergies, and medications have been entered into the medical record, reviewed, and corrections made.   Review of Systems: No fevers, chills, night sweats, weight loss, chest pain.   Objective:    General: Well Developed, well nourished, and in no acute distress.  Neuro: Alert and oriented x3, extra-ocular muscles intact,  sensation grossly intact.  HEENT: Normocephalic, atraumatic, No thyromegaly. No cervical lymphadenopathy.  Skin: Warm and dry, no rashes. Cardiac: Regular rate and rhythm, no murmurs rubs or gallops, no lower extremity edema.  Respiratory: Clear to auscultation bilaterally. Not using accessory muscles, speaking in full sentences.   Impression and Recommendations:   DM - Last A1c was well controlled and hopefully today's A1c looks great. We'll have to send her to lab as we are out of cartridges here in the office today. Will call her and adjust regimen if needed. Otherwise continue with current plan. Please schedule eye exam. She is waiting to hear back from their office for an appointment.  Fatigue-unclear etiology. We'll evaluate for anemia and thyroid disorder. Will check for Lymes per performance.    Shortness of breath-she has had a couple of episodes. Based on her description I suspect deconditioning as she is not exercising actively. She has not had any chest pain and had a negative cardiac workup a couple years ago. But certainly if her symptoms persist then consider chest x-ray and possible spirometry for evaluation.  Hopefully can schedule colonoscopy later this year.

## 2015-07-30 LAB — VITAMIN D 25 HYDROXY (VIT D DEFICIENCY, FRACTURES): Vit D, 25-Hydroxy: 32 ng/mL (ref 30–100)

## 2015-07-30 LAB — LYME AB/WESTERN BLOT REFLEX

## 2015-08-03 NOTE — Addendum Note (Signed)
Addended by: Teddy Spike on: 08/03/2015 08:14 AM   Modules accepted: Orders

## 2015-08-12 ENCOUNTER — Other Ambulatory Visit: Payer: Self-pay | Admitting: Family Medicine

## 2015-08-12 ENCOUNTER — Ambulatory Visit (INDEPENDENT_AMBULATORY_CARE_PROVIDER_SITE_OTHER): Payer: 59

## 2015-08-12 DIAGNOSIS — Z1231 Encounter for screening mammogram for malignant neoplasm of breast: Secondary | ICD-10-CM

## 2015-10-19 LAB — HM DIABETES EYE EXAM

## 2015-10-29 ENCOUNTER — Encounter: Payer: Self-pay | Admitting: Family Medicine

## 2015-10-29 ENCOUNTER — Ambulatory Visit (INDEPENDENT_AMBULATORY_CARE_PROVIDER_SITE_OTHER): Payer: 59 | Admitting: Family Medicine

## 2015-10-29 VITALS — BP 121/55 | HR 72 | Ht 67.0 in | Wt 186.0 lb

## 2015-10-29 DIAGNOSIS — IMO0001 Reserved for inherently not codable concepts without codable children: Secondary | ICD-10-CM

## 2015-10-29 DIAGNOSIS — T887XXA Unspecified adverse effect of drug or medicament, initial encounter: Secondary | ICD-10-CM | POA: Diagnosis not present

## 2015-10-29 DIAGNOSIS — E1165 Type 2 diabetes mellitus with hyperglycemia: Secondary | ICD-10-CM

## 2015-10-29 DIAGNOSIS — T50905A Adverse effect of unspecified drugs, medicaments and biological substances, initial encounter: Secondary | ICD-10-CM

## 2015-10-29 LAB — POCT GLYCOSYLATED HEMOGLOBIN (HGB A1C): HEMOGLOBIN A1C: 6.6

## 2015-10-29 NOTE — Progress Notes (Signed)
Subjective:    CC: DM  HPI:  Diabetes - no hypoglycemic events. No wounds or sores that are not healing well. No increased thirst or urination. Checking glucose at homeSeveral times a week. He is exercising some but not regularly..She reports her eye exam was done about a week ago at my eye doctor here in [place]. Will call to get that report. She actually quit taking her metformin about a month ago she. She felt like it was actually causing joint pain. She has felt much better off of it. She actually started an over-the-counter supplement for diabetes that has cinnamon and it.   Past medical history, Surgical history, Family history not pertinant except as noted below, Social history, Allergies, and medications have been entered into the medical record, reviewed, and corrections made.   Review of Systems: No fevers, chills, night sweats, weight loss, chest pain, or shortness of breath.   Objective:    General: Well Developed, well nourished, and in no acute distress.  Neuro: Alert and oriented x3, extra-ocular muscles intact, sensation grossly intact.  HEENT: Normocephalic, atraumatic  Skin: Warm and dry, no rashes. Cardiac: Regular rate and rhythm, no murmurs rubs or gallops, no lower extremity edema.  Respiratory: Clear to auscultation bilaterally. Not using accessory muscles, speaking in full sentences.   Impression and Recommendations:    DM - still well controlled, though has been off of metformin for about amonth. F/U in 3 months.   Discussed options including using another medication to replace the metformin since she has not been tolerating it well. She would prefer to continue with her supplement and continue work on diet and exercise over the next 3 months to control her glucose. Follow-up at that point in time. If her A1c continues to increase then we will definitely need to consider an alternative medication.  Medication side effect - see above.     Patient wants to  wait until she has Medicare to do her colon cancer screening.

## 2015-10-30 ENCOUNTER — Encounter: Payer: Self-pay | Admitting: Family Medicine

## 2016-01-31 ENCOUNTER — Other Ambulatory Visit: Payer: Self-pay | Admitting: Family Medicine

## 2016-02-02 ENCOUNTER — Ambulatory Visit: Payer: 59 | Admitting: Family Medicine

## 2016-02-04 ENCOUNTER — Ambulatory Visit (INDEPENDENT_AMBULATORY_CARE_PROVIDER_SITE_OTHER): Payer: Medicare HMO | Admitting: Family Medicine

## 2016-02-04 ENCOUNTER — Encounter: Payer: Self-pay | Admitting: Family Medicine

## 2016-02-04 VITALS — BP 118/72 | HR 78 | Ht 67.0 in | Wt 189.0 lb

## 2016-02-04 DIAGNOSIS — E663 Overweight: Secondary | ICD-10-CM

## 2016-02-04 DIAGNOSIS — Z853 Personal history of malignant neoplasm of breast: Secondary | ICD-10-CM

## 2016-02-04 DIAGNOSIS — Z6829 Body mass index (BMI) 29.0-29.9, adult: Secondary | ICD-10-CM

## 2016-02-04 DIAGNOSIS — IMO0001 Reserved for inherently not codable concepts without codable children: Secondary | ICD-10-CM

## 2016-02-04 DIAGNOSIS — E1165 Type 2 diabetes mellitus with hyperglycemia: Secondary | ICD-10-CM

## 2016-02-04 DIAGNOSIS — R69 Illness, unspecified: Secondary | ICD-10-CM | POA: Diagnosis not present

## 2016-02-04 LAB — POCT GLYCOSYLATED HEMOGLOBIN (HGB A1C): Hemoglobin A1C: 7

## 2016-02-04 NOTE — Progress Notes (Signed)
Subjective:    CC: DM  HPI: Diabetes - no hypoglycemic events. No wounds or sores that are not healing well. No increased thirst or urination. Checking glucose at home. Taking medications as prescribed without any side effects.Eye exam is up-to-date done October 2017. Foot exam April 2017.  History of breast cancer-her mammogram is up-to-date. Last was in August.   bmi 29/overweight - he is not currently exercising and admits over the holiday she's been eating a lot of things that she probably shouldn't have especially with her diabetes.   Past medical history, Surgical history, Family history not pertinant except as noted below, Social history, Allergies, and medications have been entered into the medical record, reviewed, and corrections made.   Review of Systems: No fevers, chills, night sweats, weight loss, chest pain, or shortness of breath.   Objective:    General: Well Developed, well nourished, and in no acute distress.  Neuro: Alert and oriented x3, extra-ocular muscles intact, sensation grossly intact.  HEENT: Normocephalic, atraumatic  Skin: Warm and dry, no rashes. Cardiac: Regular rate and rhythm, no murmurs rubs or gallops, no lower extremity edema.  Respiratory: Clear to auscultation bilaterally. Not using accessory muscles, speaking in full sentences.   Impression and Recommendations:    DM- not well controlled. A1C is up to 7.0 this time.   Thus getting back on track. Not interested in taking a statin.    BMI 29/overweight-discussed getting back on track with diet and really getting back into some routine of exercise. It would help with her sugars as well as her strength and energy level and help with her weight.

## 2016-03-07 ENCOUNTER — Other Ambulatory Visit: Payer: Self-pay | Admitting: Family Medicine

## 2016-03-08 DIAGNOSIS — R69 Illness, unspecified: Secondary | ICD-10-CM | POA: Diagnosis not present

## 2016-04-05 DIAGNOSIS — R69 Illness, unspecified: Secondary | ICD-10-CM | POA: Diagnosis not present

## 2016-04-27 ENCOUNTER — Ambulatory Visit (INDEPENDENT_AMBULATORY_CARE_PROVIDER_SITE_OTHER): Payer: Medicare HMO | Admitting: Osteopathic Medicine

## 2016-04-27 ENCOUNTER — Encounter: Payer: Self-pay | Admitting: Osteopathic Medicine

## 2016-04-27 ENCOUNTER — Other Ambulatory Visit: Payer: Self-pay | Admitting: Osteopathic Medicine

## 2016-04-27 VITALS — BP 123/71 | HR 84 | Temp 98.6°F | Ht 67.0 in | Wt 186.0 lb

## 2016-04-27 DIAGNOSIS — J029 Acute pharyngitis, unspecified: Secondary | ICD-10-CM | POA: Diagnosis not present

## 2016-04-27 LAB — POCT RAPID STREP A (OFFICE): Rapid Strep A Screen: NEGATIVE

## 2016-04-27 MED ORDER — LIDOCAINE VISCOUS HCL 2 % MT SOLN: 10.0000 mL | OROMUCOSAL | 1 refills | Status: DC | PRN

## 2016-04-27 MED ORDER — IPRATROPIUM BROMIDE 0.03 % NA SOLN: 2.0000 | Freq: Four times a day (QID) | NASAL | 0 refills | Status: DC

## 2016-04-27 NOTE — Progress Notes (Signed)
HPI: Janet Mccullough is a 66 y.o. female who presents to [facility] 04/27/16 for chief complaint of:  Chief Complaint  Patient presents with  . Sore Throat    Acute Illness: . Location & Quality: sore throat/scratchy throat, sinus pressure, ear pressure, mild cough . Assoc signs/symptoms: see ROS . Duration: 5-6 days . Modifying factors: OTC meds and salt water gargles not helpful   Past medical, social and family history reviewed.  Immune compromising conditions or other risk factors: DM2  Current medications and allergies reviewed.     Review of Systems:  Constitutional: No  fever/chills  HEENT: Yes  headache, Yes  sore throat, No  swollen glands  Cardiovascular: No chest pain  Respiratory:Yes  cough, No  shortness of breath  Gastrointestinal: No  nausea, No  vomiting,  No  diarrhea  Musculoskeletal:   No  myalgia/arthralgia  Skin/Integument:  No  rash   Detailed Exam:  BP 123/71   Pulse 84   Temp 98.6 F (37 C) (Oral)   Ht 5\' 7"  (1.702 m)   Wt 186 lb (84.4 kg)   BMI 29.13 kg/m   Constitutional:   VSS, see above.   General Appearance: alert, well-developed, well-nourished, NAD  Eyes:   Normal lids and conjunctive, non-icteric sclera  Ears, Nose, Mouth, Throat:   Normal external inspection ears/nares  Normal mouth/lips/gums, MMM  abnormal - mild clear effusion behind TM bilaterally TM  posterior pharynx without erythema, without exudate  nasal mucosa normal  Skin:  Normal inspection, no rash or concerning lesions noted on limited exam  Neck:   No masses, trachea midline. normal lymph nodes  Respiratory:   Normal respiratory effort.   No  wheeze/rhonchi/rales  Cardiovascular:   S1/S2 normal, no murmur/rub/gallop auscultated. RRR.   Results for orders placed or performed in visit on 04/27/16 (from the past 72 hour(s))  POCT rapid strep A     Status: None   Collection Time: 04/27/16  1:41 PM   Result Value Ref Range   Rapid Strep A Screen Negative Negative     ASSESSMENT/PLAN: Likely viral, would consider abx if no improvement or if worse by end of the week.   Sore throat - Plan: POCT rapid strep A, Lidocaine HCl 2 % SOLN, ipratropium (ATROVENT) 0.03 % nasal spray   Visit summary was printed for the patient with medications and pertinent instructions for patient to review. ER/RTC precautions reviewed. All questions answered. Return if symptoms worsen or fail to improve - call us.

## 2016-05-02 DIAGNOSIS — R69 Illness, unspecified: Secondary | ICD-10-CM | POA: Diagnosis not present

## 2016-05-05 ENCOUNTER — Encounter: Payer: Self-pay | Admitting: Family Medicine

## 2016-05-05 ENCOUNTER — Ambulatory Visit (INDEPENDENT_AMBULATORY_CARE_PROVIDER_SITE_OTHER): Payer: Medicare HMO | Admitting: Family Medicine

## 2016-05-05 VITALS — BP 115/62 | HR 91 | Ht 67.0 in | Wt 188.0 lb

## 2016-05-05 DIAGNOSIS — Z6829 Body mass index (BMI) 29.0-29.9, adult: Secondary | ICD-10-CM | POA: Diagnosis not present

## 2016-05-05 DIAGNOSIS — Z1211 Encounter for screening for malignant neoplasm of colon: Secondary | ICD-10-CM | POA: Diagnosis not present

## 2016-05-05 DIAGNOSIS — E1165 Type 2 diabetes mellitus with hyperglycemia: Secondary | ICD-10-CM

## 2016-05-05 DIAGNOSIS — IMO0001 Reserved for inherently not codable concepts without codable children: Secondary | ICD-10-CM

## 2016-05-05 LAB — LIPID PANEL W/REFLEX DIRECT LDL
Cholesterol: 178 mg/dL (ref <200)
HDL: 62 mg/dL (ref >50)
LDL-Cholesterol: 96 mg/dL
Non-HDL Cholesterol (Calc): 116 mg/dL (ref <130)
Total CHOL/HDL Ratio: 2.9 Ratio (ref <5.0)
Triglycerides: 105 mg/dL (ref <150)

## 2016-05-05 LAB — COMPLETE METABOLIC PANEL WITH GFR
AG Ratio: 1.8 Ratio (ref 1.0–2.5)
ALK PHOS: 54 U/L (ref 33–130)
ALT: 14 U/L (ref 6–29)
AST: 11 U/L (ref 10–35)
Albumin: 4.3 g/dL (ref 3.6–5.1)
BILIRUBIN TOTAL: 0.7 mg/dL (ref 0.2–1.2)
BUN/Creatinine Ratio: 21.9 Ratio (ref 6–22)
BUN: 16 mg/dL (ref 7–25)
CO2: 23 mmol/L (ref 20–31)
CREATININE: 0.73 mg/dL (ref 0.50–0.99)
Calcium: 9.2 mg/dL (ref 8.6–10.4)
Chloride: 104 mmol/L (ref 98–110)
GFR, EST NON AFRICAN AMERICAN: 86 mL/min (ref >=60)
GLOBULIN: 2.4 g/dL (ref 1.9–3.7)
GLUCOSE: 153 mg/dL — AB (ref 65–99)
Potassium: 4.2 mmol/L (ref 3.5–5.3)
Sodium: 141 mmol/L (ref 135–146)
TOTAL PROTEIN: 6.7 g/dL (ref 6.1–8.1)

## 2016-05-05 LAB — POCT UA - MICROALBUMIN
CREATININE, POC: 200 mg/dL
Microalbumin Ur, POC: 30 mg/L

## 2016-05-05 LAB — POCT GLYCOSYLATED HEMOGLOBIN (HGB A1C): HEMOGLOBIN A1C: 6.9

## 2016-05-05 NOTE — Progress Notes (Signed)
Subjective:    CC: DM  HPI:  Diabetes - no hypoglycemic events. No wounds or sores that are not healing well. No increased thirst or urination. Checking glucose at home. Taking medications as prescribed without any side effects. She hasn't been exercising much lately.    Colon cancer screening-she would like to move forward with Cologuard. We have discussed it previously and her insurance will cover it.  Overweight/BMI 29-she's not actively exercising. She has gained 2 pounds since she was here a few weeks ago.  Past medical history, Surgical history, Family history not pertinant except as noted below, Social history, Allergies, and medications have been entered into the medical record, reviewed, and corrections made.   Review of Systems: No fevers, chills, night sweats, weight loss, chest pain, or shortness of breath.   Objective:    General: Well Developed, well nourished, and in no acute distress.  Neuro: Alert and oriented x3, extra-ocular muscles intact, sensation grossly intact.  HEENT: Normocephalic, atraumatic  Skin: Warm and dry, no rashes. Cardiac: Regular rate and rhythm, no murmurs rubs or gallops, no lower extremity edema.  Respiratory: Clear to auscultation bilaterally. Not using accessory muscles, speaking in full sentences.   Impression and Recommendations:    DM- Well controlled. Continue current regimen. Follow up in  4 months.  A1C of 6.9. Continue to work on diet and exercise.  Due CMP and lipids. Foot exam and urine micro performed today.  Discussed option of adding 2nd medication. She got joint pain with 1000mg  of metformin previously.     Colon Ca screening - discussed Cologuard.  Form completed and faxed.    BMI 29 - discussed strategies for exercise. Start with walking 10-15 min a day.

## 2016-05-06 NOTE — Progress Notes (Signed)
All labs are normal. 

## 2016-05-19 DIAGNOSIS — Z1211 Encounter for screening for malignant neoplasm of colon: Secondary | ICD-10-CM | POA: Diagnosis not present

## 2016-05-19 DIAGNOSIS — Z1212 Encounter for screening for malignant neoplasm of rectum: Secondary | ICD-10-CM | POA: Diagnosis not present

## 2016-05-25 LAB — COLOGUARD: COLOGUARD: NEGATIVE

## 2016-05-27 ENCOUNTER — Telehealth: Payer: Self-pay | Admitting: Family Medicine

## 2016-05-27 NOTE — Telephone Encounter (Signed)
Please call patient and let her know that her color guard test was negative. This is fantastic. Recommend repeat colon cancer screening in 3 years.

## 2016-05-30 NOTE — Telephone Encounter (Signed)
lvm w/results and recommendations.Janet Mccullough Lynetta  

## 2016-06-10 ENCOUNTER — Encounter: Payer: Self-pay | Admitting: Family Medicine

## 2016-06-11 DIAGNOSIS — R69 Illness, unspecified: Secondary | ICD-10-CM | POA: Diagnosis not present

## 2016-06-12 DIAGNOSIS — J301 Allergic rhinitis due to pollen: Secondary | ICD-10-CM | POA: Diagnosis not present

## 2016-06-14 DIAGNOSIS — R69 Illness, unspecified: Secondary | ICD-10-CM | POA: Diagnosis not present

## 2016-07-25 ENCOUNTER — Other Ambulatory Visit: Payer: Self-pay | Admitting: Family Medicine

## 2016-07-25 DIAGNOSIS — Z1231 Encounter for screening mammogram for malignant neoplasm of breast: Secondary | ICD-10-CM

## 2016-08-04 ENCOUNTER — Encounter: Payer: Self-pay | Admitting: Family Medicine

## 2016-08-04 ENCOUNTER — Ambulatory Visit (INDEPENDENT_AMBULATORY_CARE_PROVIDER_SITE_OTHER): Payer: Medicare HMO | Admitting: Family Medicine

## 2016-08-04 VITALS — BP 104/61 | HR 72 | Ht 67.0 in | Wt 184.0 lb

## 2016-08-04 DIAGNOSIS — E1165 Type 2 diabetes mellitus with hyperglycemia: Secondary | ICD-10-CM | POA: Diagnosis not present

## 2016-08-04 DIAGNOSIS — IMO0001 Reserved for inherently not codable concepts without codable children: Secondary | ICD-10-CM

## 2016-08-04 DIAGNOSIS — Z6828 Body mass index (BMI) 28.0-28.9, adult: Secondary | ICD-10-CM | POA: Diagnosis not present

## 2016-08-04 LAB — POCT GLYCOSYLATED HEMOGLOBIN (HGB A1C): Hemoglobin A1C: 6.7

## 2016-08-04 NOTE — Progress Notes (Signed)
Subjective:    CC: DM  HPI: Diabetes - no hypoglycemic events. No wounds or sores that are not healing well. No increased thirst or urination. Checking glucose at home. Taking medications as prescribed without any side effects.  BMI 29 - had encouraged 10-15 min of walking daily. She has lost 4 lbs since I last saw her. She has been more active thought not walking regularly.  Has been trying to eat more salads.   Past medical history, Surgical history, Family history not pertinant except as noted below, Social history, Allergies, and medications have been entered into the medical record, reviewed, and corrections made.   Review of Systems: No fevers, chills, night sweats, weight loss, chest pain, or shortness of breath.   Objective:    General: Well Developed, well nourished, and in no acute distress.  Neuro: Alert and oriented x3, extra-ocular muscles intact, sensation grossly intact.  HEENT: Normocephalic, atraumatic  Skin: Warm and dry, no rashes. Cardiac: Regular rate and rhythm, no murmurs rubs or gallops, no lower extremity edema.  Respiratory: Clear to auscultation bilaterally. Not using accessory muscles, speaking in full sentences.   Impression and Recommendations:    DM- Well controlled. Continue current regimen. Follow up in  4 months.  A!C 6.7  BMI 28 - improving.  F/U in 4 months. Continue to work on walking regularly.   Reminded her to get her Tdap done at the pharmacy.

## 2016-08-04 NOTE — Patient Instructions (Signed)
Get her tetanus at the pharmacy.

## 2016-08-16 ENCOUNTER — Ambulatory Visit: Payer: Medicare HMO

## 2016-08-17 ENCOUNTER — Ambulatory Visit (INDEPENDENT_AMBULATORY_CARE_PROVIDER_SITE_OTHER): Payer: Medicare HMO

## 2016-08-17 DIAGNOSIS — Z1231 Encounter for screening mammogram for malignant neoplasm of breast: Secondary | ICD-10-CM | POA: Diagnosis not present

## 2016-09-20 DIAGNOSIS — R69 Illness, unspecified: Secondary | ICD-10-CM | POA: Diagnosis not present

## 2016-09-21 ENCOUNTER — Ambulatory Visit (INDEPENDENT_AMBULATORY_CARE_PROVIDER_SITE_OTHER): Payer: Medicare HMO | Admitting: Family Medicine

## 2016-09-21 ENCOUNTER — Encounter: Payer: Self-pay | Admitting: Family Medicine

## 2016-09-21 ENCOUNTER — Ambulatory Visit (INDEPENDENT_AMBULATORY_CARE_PROVIDER_SITE_OTHER): Payer: Medicare HMO

## 2016-09-21 DIAGNOSIS — M533 Sacrococcygeal disorders, not elsewhere classified: Secondary | ICD-10-CM

## 2016-09-21 NOTE — Patient Instructions (Signed)
Thank you for coming in today. Get xray today.  I recommend PT next week if not better.  Recheck with me as needed.    Tailbone Injury The tailbone (coccyx) is the small bone at the lower end of the spine. A tailbone injury may involve stretched ligaments, bruising, or a broken bone (fracture). Tailbone injuries can be painful, and some may take a long time to heal. What are the causes? This condition is often caused by falling and landing on the tailbone. Other causes include:  Repeated strain or friction from actions such as rowing and bicycling.  Childbirth.  In some cases, the cause may not be known. What increases the risk? This condition is more common in women than in men. What are the signs or symptoms? Symptoms of this condition include:  Pain in the lower back, especially when sitting.  Pain or difficulty when standing up from a sitting position.  Bruising in the tailbone area.  Painful bowel movements.  In women, pain during intercourse.  How is this diagnosed? This condition may be diagnosed based on your symptoms and a physical exam. X-rays may be taken if a fracture is suspected. You may also have other tests, such as a CT scan or MRI. How is this treated? This condition may be treated with medicines to help relieve your pain. Most tailbone injuries heal on their own in 4-6 weeks. However, recovery time may be longer if the injury involves a fracture. Follow these instructions at home:  Take medicines only as directed by your health care provider.  If directed, apply ice to the injured area: ? Put ice in a plastic bag. ? Place a towel between your skin and the bag. ? Leave the ice on for 20 minutes, 2-3 times per day for the first 1-2 days.  Sit on a large, rubber or inflated ring or cushion to ease your pain. Lean forward when you are sitting to help decrease discomfort.  Avoid sitting for long periods of time.  Increase your activity as the pain allows.  Perform any exercises that are recommended by your health care provider or physical therapist.  If you have pain during bowel movements, use stool softeners as directed by your health care provider.  Eat a diet that includes plenty of fiber to help prevent constipation.  Keep all follow-up visits as directed by your health care provider. This is important. How is this prevented? Wear appropriate padding and sports gear when bicycling and rowing. This can help to prevent developing an injury that is caused by repeated strain or friction. Contact a health care provider if:  Your pain becomes worse.  Your bowel movements cause a great deal of discomfort.  You are unable to have a bowel movement.  You have uncontrolled urine loss (urinary incontinence).  You have a fever. This information is not intended to replace advice given to you by your health care provider. Make sure you discuss any questions you have with your health care provider. Document Released: 12/25/1999 Document Revised: 08/27/2015 Document Reviewed: 12/23/2013 Elsevier Interactive Patient Education  [employer].

## 2016-09-21 NOTE — Progress Notes (Signed)
Janet Mccullough is a 66 y.o. female who presents to [facility] today for coccyx pain. Patient fell landing on her buttocks after trying to sit in a chair about a week ago. She notes mild continued pain in the buttocks. She denies any radiating pain weakness or numbness fevers or chills. She feels well otherwise with no bowel bladder dysfunction. She's tried some over-the-counter medications for pain along with an ice pack which has helped.   Past Medical History:  Diagnosis Date  . Cancer (HCC)    breast  . Impaired fasting glucose   . Microscopic hematuria    renal ultrasound normal (Dr Diona Fanti)  . Normal echocardiogram    EF 60-65%   Past Surgical History:  Procedure Laterality Date  . ABDOMINAL HYSTERECTOMY  7/03   for DUB/ cysts  . masectomy    . MASTECTOMY Left   . uterine cyst removed  5/99   Social History  Substance Use Topics  . Smoking status: Never Smoker  . Smokeless tobacco: Never Used  . Alcohol use No     ROS:  As above   Medications: Current Outpatient Prescriptions  Medication Sig Dispense Refill  . Ascorbic Acid (SUNKIST VITAMIN C PO) Take 500 mg by mouth daily.      . Blood Glucose Monitoring Suppl (ONE TOUCH ULTRA SYSTEM KIT) W/DEVICE KIT 1 kit by Does not apply route once. Lancets, test strips for testing 3 times a week. Dx: IFG Dx code: R73.01 1 each 0  . cetirizine (ZYRTEC) 10 MG tablet Take 10 mg by mouth daily.      . cholecalciferol (VITAMIN D) 1000 UNITS tablet Take 1,000 Units by mouth daily.    Marland Kitchen ipratropium (ATROVENT) 0.03 % nasal spray Place 2 sprays into both nostrils 4 (four) times daily. 30 mL 0  . Lancets Misc. MISC Use as directed. Dx: IFG Dx code: R73.01 100 each 3  . Lidocaine HCl 2 % SOLN Use as directed 10-15 mLs in the mouth or throat every 3 (three) hours as needed (mouth/throat pain). 100 mL 1  . MAGNESIUM PO Take by mouth daily.    . metFORMIN (GLUCOPHAGE-XR) 500 MG 24 hr tablet  Take 1 tablet (500 mg total) by mouth 2 (two) times daily. 180 tablet 1  . Omega-3 Fatty Acids (OMEGA 3 PO) Take by mouth daily.    . ONE TOUCH ULTRA TEST test strip USE AS DIRECTED TO TEST BLOOD GLUCOSE 3 TIMES A WEEK 25 each 11  . ONETOUCH DELICA LANCETS 01U MISC USE TO TEST BLOOD GLUCSOE THREE TIMES WEEKLY 100 each 11   No current facility-administered medications for this visit.    Allergies  Allergen Reactions  . Metformin And Related Other (See Comments)    Joint pain  . Moxifloxacin Hcl In Nacl Hives  . Penicillins Hives    REACTION: unknown rx  . Quinolones Hives    In particular Avelox     Exam:  BP 135/74   Pulse 74   Wt 188 lb (85.3 kg)   BMI 29.44 kg/m  General: Well Developed, well nourished, and in no acute distress.  Neuro/Psych: Alert and oriented x3, extra-ocular muscles intact, able to move all 4 extremities, sensation grossly intact. Skin: Warm and dry, no rashes noted.  Respiratory: Not using accessory muscles, speaking in full sentences, trachea midline.  Cardiovascular: Pulses palpable, no extremity edema. Abdomen: Does not appear distended. MSK:  Nontender to cervical thoracic and lumbar spinal midline. Nontender at  the sacrum. Tender palpation at the coccyx. Leg strength motion and gait are normal throughout. Sensation is intact distal bilateral lower extremities.  X-ray coccyx pending  No results found for this or any previous visit (from the past 48 hour(s)). No results found.    Assessment and Plan: 66 y.o. female with coccyx injury likely contusion x-ray pending however. Then for relative rest prescription strength NSAIDs at home as needed as well as referral to physical therapy. Recheck it in several weeks if not improving.    Orders Placed This Encounter  Procedures  . DG Sacrum/Coccyx    Standing Status:   Future    Number of Occurrences:   1    Standing Expiration Date:   11/21/2017    Order Specific Question:   Reason for Exam  (SYMPTOM  OR DIAGNOSIS REQUIRED)    Answer:   eval coccyx pain after fall    Order Specific Question:   Preferred imaging location?    Answer:   [facility]    Order Specific Question:   Radiology Contrast Protocol - do NOT remove file path    Answer:   [email or web address]  . Ambulatory referral to Physical Therapy    Referral Priority:   Routine    Referral Type:   Physical Medicine    Referral Reason:   Specialty Services Required    Requested Specialty:   Physical Therapy   No orders of the defined types were placed in this encounter.   Discussed warning signs or symptoms. Please see discharge instructions. Patient expresses understanding.

## 2016-10-03 ENCOUNTER — Encounter: Payer: Self-pay | Admitting: Rehabilitative and Restorative Service Providers"

## 2016-10-03 ENCOUNTER — Ambulatory Visit (INDEPENDENT_AMBULATORY_CARE_PROVIDER_SITE_OTHER): Payer: Medicare HMO | Admitting: Rehabilitative and Restorative Service Providers"

## 2016-10-03 DIAGNOSIS — R29898 Other symptoms and signs involving the musculoskeletal system: Secondary | ICD-10-CM | POA: Diagnosis not present

## 2016-10-03 DIAGNOSIS — M533 Sacrococcygeal disorders, not elsewhere classified: Secondary | ICD-10-CM

## 2016-10-03 NOTE — Patient Instructions (Signed)
Stretch out strap - [employer]   Stretch buttocks - pulling gluteal folds apart while on stomach  Hold 10-15 sec  5 reps 2 times a day   Shoulder Blade Squeeze    Rotate shoulders back, then squeeze shoulder blades down and back Hold 10 sec Repeat _10 ___ times. Do _several __ sessions per day. Can use swim noodle to cue posture    Gluteal Sets    Tighten buttocks while pressing pelvis to floor. Hold _10___ seconds. Repeat __5__ times per set. Do __1-2__ sets per session. Do _1-2___ sessions per day. Squeeze buttocks while stretching gluteal folds apart   HIP: Hamstrings - Supine   Place strap around foot. Raise leg up, keeping knee straight.  Bend opposite knee to protect back if indicated. Hold 30 seconds. 3 reps per set, 2-3 sets per day     Outer Hip Stretch: Reclined IT Band Stretch (Strap)   Strap around one foot, pull leg across body until you feel a pull or stretch, with shoulders on mat. Hold for 30-45 seconds. Repeat 3 times each leg. 2-3 times/day.  Piriformis Stretch   Lying on back, pull right knee toward opposite shoulder. Hold 30 seconds. Repeat 3 times. Do 2-3 sessions per day.    Quads / HF, Prone   Lie face down. Grasp one ankle with same-side hand. Use towel if needed to reach. Gently pull foot toward buttock.  Hold 30 seconds. Repeat 3 times per session. Do 2-3 sessions per day.   Abdominal Bracing With Pelvic Floor (Hook-Lying)    With neutral spine, tighten pelvic floor and abdominals sucking belly button to back bone; tens muscles in back at waist; exhale slowly to the count of 10 . Repeat _10__ times. Do __several_ times a day. Progress to do this in sitting stand and walking    Sleeping on Back  Place pillow under knees. A pillow with cervical support and a roll around waist are also helpful. Copyright  VHI. All rights reserved.  Sleeping on Side Place pillow between knees. Use cervical support under neck and a roll around waist  as needed. Copyright  VHI. All rights reserved.   Sleeping on Stomach   If this is the only desirable sleeping position, place pillow under lower legs, and under stomach or chest as needed.  Posture - Sitting   Sit upright, head facing forward. Try using a roll to support lower back. Keep shoulders relaxed, and avoid rounded back. Keep hips level with knees. Avoid crossing legs for long periods. Stand to Sit / Sit to Stand   To sit: Bend knees to lower self onto front edge of chair, then scoot back on seat. To stand: Reverse sequence by placing one foot forward, and scoot to front of seat. Use rocking motion to stand up.   Work Height and Reach  Ideal work height is no more than 2 to 4 inches below elbow level when standing, and at elbow level when sitting. Reaching should be limited to arm's length, with elbows slightly bent.  Bending  Bend at hips and knees, not back. Keep feet shoulder-width apart.    Posture - Standing   Good posture is important. Avoid slouching and forward head thrust. Maintain curve in low back and align ears over shoul- ders, hips over ankles.  Alternating Positions   Alternate tasks and change positions frequently to reduce fatigue and muscle tension. Take rest breaks. Computer Work   Position work to Programmer, multimedia. Use proper work and seat  height. Keep shoulders back and down, wrists straight, and elbows at right angles. Use chair that provides full back support. Add footrest and lumbar roll as needed.  Getting Into / Out of Car  Lower self onto seat, scoot back, then bring in one leg at a time. Reverse sequence to get out.  Dressing  Lie on back to pull socks or slacks over feet, or sit and bend leg while keeping back straight.    Housework - Sink  Place one foot on ledge of cabinet under sink when standing at sink for prolonged periods.   Pushing / Pulling  Pushing is preferable to pulling. Keep back in proper alignment, and use leg  muscles to do the work.  Deep Squat   Squat and lift with both arms held against upper trunk. Tighten stomach muscles without holding breath. Use smooth movements to avoid jerking.  Avoid Twisting   Avoid twisting or bending back. Pivot around using foot movements, and bend at knees if needed when reaching for articles.  Carrying Luggage   Distribute weight evenly on both sides. Use a cart whenever possible. Do not twist trunk. Move body as a unit.   Lifting Principles .Maintain proper posture and head alignment. .Slide object as close as possible before lifting. .Move obstacles out of the way. .Test before lifting; ask for help if too heavy. .Tighten stomach muscles without holding breath. .Use smooth movements; do not jerk. .Use legs to do the work, and pivot with feet. .Distribute the work load symmetrically and close to the center of trunk. .Push instead of pull whenever possible.   Ask For Help   Ask for help and delegate to others when possible. Coordinate your movements when lifting together, and maintain the low back curve.  Log Roll   Lying on back, bend left knee and place left arm across chest. Roll all in one movement to the right. Reverse to roll to the left. Always move as one unit. Housework - Sweeping  Use long-handled equipment to avoid stooping.   Housework - Wiping  Position yourself as close as possible to reach work surface. Avoid straining your back.  Laundry - Unloading Wash   To unload small items at bottom of washer, lift leg opposite to arm being used to reach.  [place] close to area to be raked. Use arm movements to do the work. Keep back straight and avoid twisting.     Cart  When reaching into cart with one arm, lift opposite leg to keep back straight.   Getting Into / Out of Bed  Lower self to lie down on one side by raising legs and lowering head at the same time. Use arms to assist moving without twisting.  Bend both knees to roll onto back if desired. To sit up, start from lying on side, and use same move-ments in reverse. Housework - Vacuuming  Hold the vacuum with arm held at side. Step back and forth to move it, keeping head up. Avoid twisting.   Laundry - IT consultant so that bending and twisting can be avoided.   Laundry - Unloading Dryer  Squat down to reach into clothes dryer or use a reacher.  Gardening - Weeding / Probation officer or Kneel. Knee pads may be helpful.

## 2016-10-03 NOTE — Therapy (Addendum)
[place], [place], [address] Phone: [phone]   Fax:  [phone]  Physical Therapy Evaluation  Patient Details  Name: Janet Mccullough MRN: [id number] Date of Birth: 05-Jul-1950 Referring Provider: Dr Lynne Leader   Encounter Date: 10/03/2016      PT End of Session - 10/03/16 1015    Visit Number 1   Number of Visits 4   Date for PT Re-Evaluation 11/01/16   PT Start Time 1012   PT Stop Time 1100   PT Time Calculation (min) 48 min   Activity Tolerance Patient tolerated treatment well      Past Medical History:  Diagnosis Date  . Cancer (HCC)    breast  . Impaired fasting glucose   . Microscopic hematuria    renal ultrasound normal (Dr Diona Fanti)  . Normal echocardiogram    EF 60-65%    Past Surgical History:  Procedure Laterality Date  . ABDOMINAL HYSTERECTOMY  7/03   for DUB/ cysts  . masectomy    . MASTECTOMY Left   . uterine cyst removed  5/99    There were no vitals filed for this visit.       Subjective Assessment - 10/03/16 1017    Subjective Patient reports that she fell when she was sitting into a chair, landing on the floor on buttocks. She continues to have pain in the tailbone area. Symtpoms have improved but she notices the soreness with certain movements and certain chairs she sits in..    Pertinent History denies any musculoskeletal problems    How long can you sit comfortably? several hours    How long can you stand comfortably? some LBP with prolonged standing    How long can you walk comfortably? some LBP with prolonged walking    Diagnostic tests xrays (-)    Patient Stated Goals to be sure she does not have future problems    Currently in Pain? No/denies   Pain Location Coccyx   Pain Orientation Mid   Pain Descriptors / Indicators Tender;Sore   Pain Type Acute pain   Pain Onset 1 to 4 weeks ago   Pain Frequency Intermittent   Aggravating Factors  prolonged  sitting; pressure directly on the area when sitting; certain movements    Pain Relieving Factors avoiding activities that irritate symptoms             [facility] PT Assessment - 10/03/16 0001      Assessment   Medical Diagnosis coccydynia   Referring Provider Dr Lynne Leader    Onset Date/Surgical Date 09/16/16   Hand Dominance Right   Next MD Visit PRN    Prior Therapy no      Precautions   Precautions None     Balance Screen   Has the patient fallen in the past 6 months Yes   How many times? 1   Has the patient had a decrease in activity level because of a fear of falling?  No   Is the patient reluctant to leave their home because of a fear of falling?  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 End range tightnes bilat hips      Strength   Overall Strength Comments 5/5 bilat LE's      Flexibility   Hamstrings 75 deg bilat    Quadriceps tight 95-100 deg bilat    ITB tight bilat    Piriformis tight bilat      Palpation   Spinal mobility hypomobilie lumbar minimal pain; tenderness coccyx    Palpation comment tenderness at either side of coccyx - no pain             Objective measurements completed on examination: See above findings.          [facility] PT Treatment/Exercise - 10/03/16 0001      Therapeutic Activites    Therapeutic Activities --  back care and body mechanics education      Neuro Re-ed    Neuro Re-ed Details  postural correction      Exercises   Exercises --  see HEP  program                 PT Education - 10/03/16 1100    Education provided Yes   Education Details HEP    Person(s) Educated Patient   Methods Explanation;Demonstration;Tactile cues;Verbal cues;Handout   Comprehension Verbalized understanding;Returned demonstration;Verbal cues required;Tactile cues required          PT Short Term Goals - 10/03/16 1107      PT SHORT TERM GOAL #1   Title Instruct patient in appropriate HEP 10/03/16   Time 1   Period Days   Status Achieved     PT SHORT TERM GOAL #2   Title Further assessment and additional treatment as indicated/patient to call for appointments as needed 11/01/16   Time 4   Period Weeks   Status New     PT SHORT TERM GOAL #3   Title Improve FOTO to </= 17% limitation 11/01/16   Time 4   Period Weeks   Status New                   Plan - 10/03/16 1102    Clinical Impression Statement Liridona presents ~2 weeks post fall landing on buttocks. She has resolving coccyx pain. Patient has limited mobility through LEs's; tightness to palpation through the buttocks and gluteal/coccyx area bilat; discomfort and soreness with prolonged postions and certain activities. She will beneift form Physical Therapy to address problems and continue progress toward resolution of symptoms.    Clinical Presentation Stable   Clinical Decision Making Low   Rehab Potential Good   PT Frequency 1x / week   PT Duration 4 weeks   PT Treatment/Interventions Patient/family education;ADLs/Self Care Home Management;Cryotherapy;Electrical Stimulation;Iontophoresis 48m/ml Dexamethasone;Moist Heat;Ultrasound;Dry needling;Manual techniques;Therapeutic activities;Therapeutic exercise;Neuromuscular re-education   PT Next Visit Plan review HEP; progress with core stabilization; manual work and modalites through involved area as indicated. Patient will work on HEP and call if she feels she needs to schedule additional appointments.    Consulted and  Agree with Plan of Care Patient      Patient will benefit from skilled therapeutic intervention in order to improve the following deficits and impairments:  Postural dysfunction, Pain, Decreased range of motion, Decreased mobility, Increased fascial restricitons, Decreased activity tolerance  Visit Diagnosis: Coccydynia - Plan: PT plan of care  cert/re-cert  Other symptoms and signs involving the musculoskeletal system - Plan: PT plan of care cert/re-cert     Problem List Patient Active Problem List   Diagnosis Date Noted  . Coccydynia 09/21/2016  . Osteopenia 10/29/2013  . VASOMOTOR RHINITIS 06/16/2008  . HAY FEVER 04/14/2008  . HEMATURIA 04/24/2006  . Diabetes type 2, uncontrolled ([place]) 03/22/2006  . History of breast cancer 10/18/2005  . MENOPAUSAL SYNDROME 10/18/2005    Khady Vandenberg Nilda Simmer PT, MPH  10/03/2016, 11:37 AM  [facility] [place], [place], [address] Phone: [phone]   Fax:  [phone]  Name: Faylinn Schwenn MRN: [id number] Date of Birth: 10-18-1950  PHYSICAL THERAPY DISCHARGE SUMMARY  Visits from Start of Care: Eval only  Current functional level related to goals / functional outcomes: Unknown    Remaining deficits: Unknown    Education / Equipment: iniitial HEP  Plan: Patient agrees to discharge.  Patient goals were not met. Patient is being discharged due to not returning since the last visit.  ?????    Adilynne Fitzwater P. Helene Kelp PT, MPH 11/11/16 2:11 PM

## 2016-10-19 DIAGNOSIS — R69 Illness, unspecified: Secondary | ICD-10-CM | POA: Diagnosis not present

## 2016-10-24 DIAGNOSIS — R69 Illness, unspecified: Secondary | ICD-10-CM | POA: Diagnosis not present

## 2016-10-25 LAB — HM DIABETES EYE EXAM

## 2016-12-05 ENCOUNTER — Ambulatory Visit: Payer: Medicare HMO | Admitting: Family Medicine

## 2016-12-05 ENCOUNTER — Encounter: Payer: Self-pay | Admitting: Family Medicine

## 2016-12-05 VITALS — BP 112/63 | HR 78 | Ht 67.0 in | Wt 191.0 lb

## 2016-12-05 DIAGNOSIS — C801 Malignant (primary) neoplasm, unspecified: Secondary | ICD-10-CM | POA: Insufficient documentation

## 2016-12-05 DIAGNOSIS — Z6829 Body mass index (BMI) 29.0-29.9, adult: Secondary | ICD-10-CM

## 2016-12-05 DIAGNOSIS — E119 Type 2 diabetes mellitus without complications: Secondary | ICD-10-CM

## 2016-12-05 LAB — POCT GLYCOSYLATED HEMOGLOBIN (HGB A1C): Hemoglobin A1C: 7.1

## 2016-12-05 LAB — BASIC METABOLIC PANEL WITH GFR
BUN: 14 mg/dL (ref 7–25)
CO2: 31 mmol/L (ref 20–32)
CREATININE: 0.61 mg/dL (ref 0.50–0.99)
Calcium: 9.3 mg/dL (ref 8.6–10.4)
Chloride: 103 mmol/L (ref 98–110)
GFR, EST NON AFRICAN AMERICAN: 94 mL/min/{1.73_m2} (ref >=60)
GFR, Est African American: 109 mL/min/{1.73_m2} (ref >=60)
Glucose, Bld: 195 mg/dL — ABNORMAL HIGH (ref 65–99)
Potassium: 4.4 mmol/L (ref 3.5–5.3)
SODIUM: 139 mmol/L (ref 135–146)

## 2016-12-05 NOTE — Progress Notes (Signed)
Subjective:    CC: DM   HPI:  Diabetes - no hypoglycemic events. No wounds or sores that are not healing well. No increased thirst or urination. Checking glucose at home. Taking medications as prescribed without any side effects. Report had eye exam done recently at [facility].  She is off her metformin. Says her brother and father were on it and had heart problems.  She has read "bad things" about it.   Last a1c of 6.7.  BMI 29-she has gained some weight back since I last saw her.  She admits she has not been exercising regularly.  Past medical history, Surgical history, Family history not pertinant except as noted below, Social history, Allergies, and medications have been entered into the medical record, reviewed, and corrections made.   Review of Systems: No fevers, chills, night sweats, weight loss, chest pain, or shortness of breath.   Objective:    General: Well Developed, well nourished, and in no acute distress.  Neuro: Alert and oriented x3, extra-ocular muscles intact, sensation grossly intact.  HEENT: Normocephalic, atraumatic  Skin: Warm and dry, no rashes. Cardiac: Regular rate and rhythm, no murmurs rubs or gallops, no lower extremity edema.  Respiratory: Clear to auscultation bilaterally. Not using accessory muscles, speaking in full sentences.   Impression and Recommendations:    DM - Will call  for eye exam. A1C uncontrolled. She has been off her metformin.  Encouraged her to restart her medication. She wants to continue to try her supplements and work on exercise as well.  We had a discussion about controlling her A1c and that metformin actually reduce his cardiovascular risk.  Follow-up in 3 months instead of 4 months.    BMI 29 - Encouraged her to get back on track with diet and exercise as she has gained some weight as well.

## 2016-12-13 DIAGNOSIS — C50912 Malignant neoplasm of unspecified site of left female breast: Secondary | ICD-10-CM | POA: Diagnosis not present

## 2016-12-26 DIAGNOSIS — C50912 Malignant neoplasm of unspecified site of left female breast: Secondary | ICD-10-CM | POA: Diagnosis not present

## 2017-01-20 ENCOUNTER — Encounter: Payer: Self-pay | Admitting: Family Medicine

## 2017-01-31 ENCOUNTER — Other Ambulatory Visit: Payer: Self-pay | Admitting: Family Medicine

## 2017-01-31 DIAGNOSIS — E119 Type 2 diabetes mellitus without complications: Secondary | ICD-10-CM

## 2017-02-01 DIAGNOSIS — R69 Illness, unspecified: Secondary | ICD-10-CM | POA: Diagnosis not present

## 2017-02-02 ENCOUNTER — Other Ambulatory Visit: Payer: Self-pay | Admitting: Family Medicine

## 2017-02-02 DIAGNOSIS — E1165 Type 2 diabetes mellitus with hyperglycemia: Principal | ICD-10-CM

## 2017-02-02 DIAGNOSIS — IMO0001 Reserved for inherently not codable concepts without codable children: Secondary | ICD-10-CM

## 2017-02-21 ENCOUNTER — Encounter: Payer: Self-pay | Admitting: Family Medicine

## 2017-02-21 ENCOUNTER — Ambulatory Visit (INDEPENDENT_AMBULATORY_CARE_PROVIDER_SITE_OTHER): Payer: Medicare HMO | Admitting: Family Medicine

## 2017-02-21 VITALS — BP 117/70 | HR 79 | Ht 67.0 in | Wt 185.0 lb

## 2017-02-21 DIAGNOSIS — B9789 Other viral agents as the cause of diseases classified elsewhere: Secondary | ICD-10-CM

## 2017-02-21 DIAGNOSIS — J069 Acute upper respiratory infection, unspecified: Secondary | ICD-10-CM

## 2017-02-21 NOTE — Patient Instructions (Signed)
Thank you for coming in today. Call or go to the ER if you develop a large red swollen joint with extreme pain or oozing puss.  I think this is an infection of the upper respiratory tract.  Continue over the counter medications as needed for symptoms.  I do not think this is the Flu or Strep Throat.  I think you are ok to return to work if you feel ok.  Call or go to the ER if you develop a large red swollen joint with extreme pain or oozing puss.    Upper Respiratory Infection, Adult Most upper respiratory infections (URIs) are caused by a virus. A URI affects the nose, throat, and upper air passages. The most common type of URI is often called "the common cold." Follow these instructions at home:  Take medicines only as told by your doctor.  Gargle warm saltwater or take cough drops to comfort your throat as told by your doctor.  Use a warm mist humidifier or inhale steam from a shower to increase air moisture. This may make it easier to breathe.  Drink enough fluid to keep your pee (urine) clear or pale yellow.  Eat soups and other clear broths.  Have a healthy diet.  Rest as needed.  Go back to work when your fever is gone or your doctor says it is okay. ? You may need to stay home longer to avoid giving your URI to others. ? You can also wear a face mask and wash your hands often to prevent spread of the virus.  Use your inhaler more if you have asthma.  Do not use any tobacco products, including cigarettes, chewing tobacco, or electronic cigarettes. If you need help quitting, ask your doctor. Contact a doctor if:  You are getting worse, not better.  Your symptoms are not helped by medicine.  You have chills.  You are getting more short of breath.  You have brown or red mucus.  You have yellow or brown discharge from your nose.  You have pain in your face, especially when you bend forward.  You have a fever.  You have puffy (swollen) neck glands.  You have  pain while swallowing.  You have white areas in the back of your throat. Get help right away if:  You have very bad or constant: ? Headache. ? Ear pain. ? Pain in your forehead, behind your eyes, and over your cheekbones (sinus pain). ? Chest pain.  You have long-lasting (chronic) lung disease and any of the following: ? Wheezing. ? Long-lasting cough. ? Coughing up blood. ? A change in your usual mucus.  You have a stiff neck.  You have changes in your: ? Vision. ? Hearing. ? Thinking. ? Mood. This information is not intended to replace advice given to you by your health care provider. Make sure you discuss any questions you have with your health care provider. Document Released: 06/15/2007 Document Revised: 08/30/2015 Document Reviewed: 04/03/2013 Elsevier Interactive Patient Education  2018 [employer].

## 2017-02-21 NOTE — Progress Notes (Signed)
Janet Mccullough is a 67 y.o. female who presents to [facility] today for cough. Janet Mccullough notes a non-productive cough for 6 days. Her illness started with fever, chills and body aches. She has mostly improved and has a runny nose and cough now. She has used OTC medications which have helped. She has a prescription for atrovent nasal spray that she has not used yet. She feels pretty well overall.    Past Medical History:  Diagnosis Date  . Cancer (HCC)    breast  . Impaired fasting glucose   . Microscopic hematuria    renal ultrasound normal (Dr Diona Fanti)  . Normal echocardiogram    EF 60-65%   Past Surgical History:  Procedure Laterality Date  . ABDOMINAL HYSTERECTOMY  7/03   for DUB/ cysts  . masectomy    . MASTECTOMY Left   . uterine cyst removed  5/99   Social History   Tobacco Use  . Smoking status: Never Smoker  . Smokeless tobacco: Never Used  Substance Use Topics  . Alcohol use: No   family history includes Diabetes in her brother, brother, and father; Heart disease in her father.  ROS as above:  Medications: Current Outpatient Medications  Medication Sig Dispense Refill  . Ascorbic Acid (SUNKIST VITAMIN C PO) Take 500 mg by mouth daily.      . Blood Glucose Monitoring Suppl (ONE TOUCH ULTRA SYSTEM KIT) W/DEVICE KIT 1 kit by Does not apply route once. Lancets, test strips for testing 3 times a week. Dx: IFG Dx code: R73.01 1 each 0  . cetirizine (ZYRTEC) 10 MG tablet Take 10 mg by mouth daily.      . cholecalciferol (VITAMIN D) 1000 UNITS tablet Take 1,000 Units by mouth daily.    Marland Kitchen glucose blood (ONE TOUCH ULTRA TEST) test strip Dx: E11.9 50 each 11  . ipratropium (ATROVENT) 0.03 % nasal spray Place 2 sprays into both nostrils 4 (four) times daily. 30 mL 0  . MAGNESIUM PO Take by mouth daily.    . metFORMIN (GLUCOPHAGE-XR) 500 MG 24 hr tablet  TAKE 1 TABLET(500 MG) BY MOUTH TWICE DAILY 180 tablet 0  . Omega-3 Fatty Acids (OMEGA 3 PO) Take by mouth daily.    [name] LANCETS 47Q MISC USE TO TEST BLOOD GLUCSOE THREE TIMES WEEKLY 100 each 11   No current facility-administered medications for this visit.    Allergies  Allergen Reactions  . Metformin And Related Other (See Comments)    Joint pain  . Moxifloxacin Hcl In Nacl Hives  . Penicillins Hives    REACTION: unknown rx  . Quinolones Hives    In particular Avelox    Health Maintenance Health Maintenance  Topic Date Due  . TETANUS/TDAP  12/05/2017 (Originally 01/11/2012)  . INFLUENZA VACCINE  04/10/2027 (Originally 08/10/2016)  . PNA vac Low Risk Adult (1 of 2 - PCV13) 05/10/2027 (Originally 03/05/2015)  . FOOT EXAM  05/05/2017  . URINE MICROALBUMIN  05/05/2017  . HEMOGLOBIN A1C  06/04/2017  . OPHTHALMOLOGY EXAM  10/25/2017  . MAMMOGRAM  08/18/2018  . Fecal DNA (Cologuard)  05/26/2019  . DEXA SCAN  02/24/2021  . Hepatitis C Screening  Completed     Exam:  BP 117/70   Pulse 79   Ht _0  (1.702 m)   Wt 185 lb (83.9 kg)   BMI 28.98 kg/m  Gen: Well NAD HEENT: EOMI,  MMM, clear nasal discharge. Posterior  pharynx with cobblestoning. Normal left TM, right with mild effusion. Non cervical LAD. Lungs: Normal work of breathing. CTABL Heart: RRR no MRG Abd: NABS, Soft. Nondistended, Nontender Exts: Brisk capillary refill, warm and well perfused.    No results found for this or any previous visit (from the past 72 hour(s)). No results found.    Assessment and Plan: 67 y.o. female with cough likely due to viral uri. Janet Mccullough is doing well with OTC medication. I recommend adding the prescription Atrovent nasal spray and continue watchful waiting.  Recheck with PCP as scheduled in about 2 week.   I spent 15 minutes with this patient, greater than 50% was face-to-face time counseling regarding ddx and treatment plan.    Discussed warning signs or symptoms.  Please see discharge instructions. Patient expresses understanding.

## 2017-03-07 ENCOUNTER — Encounter: Payer: Self-pay | Admitting: Family Medicine

## 2017-03-07 ENCOUNTER — Ambulatory Visit (INDEPENDENT_AMBULATORY_CARE_PROVIDER_SITE_OTHER): Payer: Medicare HMO | Admitting: Family Medicine

## 2017-03-07 VITALS — BP 118/66 | HR 74 | Ht 67.0 in | Wt 184.0 lb

## 2017-03-07 DIAGNOSIS — E119 Type 2 diabetes mellitus without complications: Secondary | ICD-10-CM

## 2017-03-07 LAB — POCT GLYCOSYLATED HEMOGLOBIN (HGB A1C): Hemoglobin A1C: 7

## 2017-03-07 NOTE — Progress Notes (Signed)
Subjective:    CC: DM  HPI:  Diabetes - no hypoglycemic events. No wounds or sores that are not healing well. No increased thirst or urination. Checking glucose at home. Says her sugars have been coming down. She is not exercising but says she is working on her diet.  She did go to integretive medicine to see if they had any recommendations for her.  Is taking some additional supplements as well.   Past medical history, Surgical history, Family history not pertinant except as noted below, Social history, Allergies, and medications have been entered into the medical record, reviewed, and corrections made.   Review of Systems: No fevers, chills, night sweats, weight loss, chest pain, or shortness of breath.   Objective:    General: Well Developed, well nourished, and in no acute distress.  Neuro: Alert and oriented x3, extra-ocular muscles intact, sensation grossly intact.  HEENT: Normocephalic, atraumatic  Skin: Warm and dry, no rashes. Cardiac: Regular rate and rhythm, no murmurs rubs or gallops, no lower extremity edema.  Respiratory: Clear to auscultation bilaterally. Not using accessory muscles, speaking in full sentences.   Impression and Recommendations:    DM -improved.  Hemoglobin A1c 7.0.  She is taking the metformin and so she is actually been tolerating it well without any side effects.  We discussed the importance of low-carb diet high protein diet she will follow back up in 3 months.  She wants to continue to work on her diet and starting to exercise again.  Will she will be due for blood work at that time.  Time spent 20 minutes, greater than 50% of time spent face-to-face counseling about diabetes.

## 2017-04-25 ENCOUNTER — Other Ambulatory Visit: Payer: Self-pay | Admitting: Family Medicine

## 2017-04-25 DIAGNOSIS — R69 Illness, unspecified: Secondary | ICD-10-CM | POA: Diagnosis not present

## 2017-04-28 DIAGNOSIS — R69 Illness, unspecified: Secondary | ICD-10-CM | POA: Diagnosis not present

## 2017-06-06 ENCOUNTER — Ambulatory Visit: Payer: Medicare HMO | Admitting: Family Medicine

## 2017-06-08 ENCOUNTER — Encounter: Payer: Self-pay | Admitting: Family Medicine

## 2017-06-08 ENCOUNTER — Ambulatory Visit (INDEPENDENT_AMBULATORY_CARE_PROVIDER_SITE_OTHER): Payer: Medicare HMO | Admitting: Family Medicine

## 2017-06-08 VITALS — BP 117/59 | HR 69 | Ht 67.01 in | Wt 182.0 lb

## 2017-06-08 DIAGNOSIS — R7989 Other specified abnormal findings of blood chemistry: Secondary | ICD-10-CM

## 2017-06-08 DIAGNOSIS — M79605 Pain in left leg: Secondary | ICD-10-CM

## 2017-06-08 DIAGNOSIS — E119 Type 2 diabetes mellitus without complications: Secondary | ICD-10-CM

## 2017-06-08 DIAGNOSIS — R5383 Other fatigue: Secondary | ICD-10-CM | POA: Diagnosis not present

## 2017-06-08 LAB — POCT UA - MICROALBUMIN

## 2017-06-08 LAB — POCT GLYCOSYLATED HEMOGLOBIN (HGB A1C): Hemoglobin A1C: 6.5 % — AB (ref 4.0–5.6)

## 2017-06-08 NOTE — Progress Notes (Signed)
Subjective:    CC: DM  HPI:  Diabetes - no hypoglycemic events. No wounds or sores that are not healing well. No increased thirst or urination. Checking glucose at home. Taking medications as prescribed without any side effects. Lab Results  Component Value Date   HGBA1C 6.5 (A) 06/08/2017   He still complains of significant low energy levels.  She decided to go to [facility] back in February.  They did some additional blood work and told her that her B12 was low and her iron was high.  They recommended a B12 supplement.  They did want to recheck her iron levels again and also wanted to make sure that she was taking adequate vitamin D levels.  She also complains of some pain and discomfort in her left upper outer thigh.  She said back in March she was traveling and actually jumped off a retaining wall.  Her right knee gave way and she landed on it but somehow twisted her left leg and injured her left outer thigh.  She says it is getting better than it was but says it bothers her when she gets up to stand.   Past medical history, Surgical history, Family history not pertinant except as noted below, Social history, Allergies, and medications have been entered into the medical record, reviewed, and corrections made.   Review of Systems: No fevers, chills, night sweats, weight loss, chest pain, or shortness of breath.   Objective:    General: Well Developed, well nourished, and in no acute distress.  Neuro: Alert and oriented x3, extra-ocular muscles intact, sensation grossly intact.  HEENT: Normocephalic, atraumatic  Skin: Warm and dry, no rashes. Cardiac: Regular rate and rhythm, no murmurs rubs or gallops, no lower extremity edema.  Respiratory: Clear to auscultation bilaterally. Not using accessory muscles, speaking in full sentences. MSK: Hip, knee, ankle strength is 5 out of 5 bilaterally.  No significant crepitus at the knee.  Nontender over the outer thigh.   Nontender over the outer hip.   Impression and Recommendations:    DM - .Well controlled. Continue current regimen. Follow up in 4 months.    Left upper outer leg pain-handout with exercises to do on her own at home.  If she is not improving over the next 3 weeks and will get her scheduled with sports medicine for further work-up.  Fatigue-only on supplements and hoping that this will help her fatigue level.  Following with [facility] to address this particular issue.  Elevated iron levels-we will recheck.  She is not currently taking any type of supplementation.  Due for urine microalbumin and labs.

## 2017-06-09 LAB — COMPLETE METABOLIC PANEL WITH GFR
AG Ratio: 2 (calc) (ref 1.0–2.5)
ALBUMIN MSPROF: 4.5 g/dL (ref 3.6–5.1)
ALKALINE PHOSPHATASE (APISO): 53 U/L (ref 33–130)
ALT: 16 U/L (ref 6–29)
AST: 11 U/L (ref 10–35)
BUN: 12 mg/dL (ref 7–25)
CALCIUM: 9.5 mg/dL (ref 8.6–10.4)
CO2: 25 mmol/L (ref 20–32)
CREATININE: 0.67 mg/dL (ref 0.50–0.99)
Chloride: 107 mmol/L (ref 98–110)
GFR, EST AFRICAN AMERICAN: 105 mL/min/{1.73_m2} (ref >=60)
GFR, Est Non African American: 91 mL/min/{1.73_m2} (ref >=60)
GLUCOSE: 129 mg/dL — AB (ref 65–99)
Globulin: 2.3 g/dL (calc) (ref 1.9–3.7)
Potassium: 4.3 mmol/L (ref 3.5–5.3)
Sodium: 141 mmol/L (ref 135–146)
TOTAL PROTEIN: 6.8 g/dL (ref 6.1–8.1)
Total Bilirubin: 0.6 mg/dL (ref 0.2–1.2)

## 2017-06-09 LAB — LIPID PANEL
CHOL/HDL RATIO: 2.7 (calc) (ref <5.0)
CHOLESTEROL: 190 mg/dL (ref <200)
HDL: 70 mg/dL (ref >50)
LDL CHOLESTEROL (CALC): 102 mg/dL — AB
Non-HDL Cholesterol (Calc): 120 mg/dL (calc) (ref <130)
Triglycerides: 87 mg/dL (ref <150)

## 2017-06-09 LAB — IRON,TIBC AND FERRITIN PANEL
%SAT: 39 % (ref 11–50)
FERRITIN: 278 ng/mL (ref 20–288)
IRON: 110 ug/dL (ref 45–160)
TIBC: 282 mcg/dL (calc) (ref 250–450)

## 2017-07-24 ENCOUNTER — Other Ambulatory Visit: Payer: Self-pay

## 2017-07-24 DIAGNOSIS — E1165 Type 2 diabetes mellitus with hyperglycemia: Principal | ICD-10-CM

## 2017-07-24 DIAGNOSIS — IMO0001 Reserved for inherently not codable concepts without codable children: Secondary | ICD-10-CM

## 2017-07-24 MED ORDER — METFORMIN HCL ER 500 MG PO TB24: 500.0000 mg | ORAL_TABLET | Freq: Two times a day (BID) | ORAL | 1 refills | Status: DC

## 2017-07-27 ENCOUNTER — Other Ambulatory Visit: Payer: Self-pay | Admitting: Family Medicine

## 2017-07-27 DIAGNOSIS — E1165 Type 2 diabetes mellitus with hyperglycemia: Principal | ICD-10-CM

## 2017-07-27 DIAGNOSIS — IMO0001 Reserved for inherently not codable concepts without codable children: Secondary | ICD-10-CM

## 2017-07-27 DIAGNOSIS — R69 Illness, unspecified: Secondary | ICD-10-CM | POA: Diagnosis not present

## 2017-09-19 ENCOUNTER — Other Ambulatory Visit: Payer: Self-pay | Admitting: Family Medicine

## 2017-09-19 DIAGNOSIS — Z1231 Encounter for screening mammogram for malignant neoplasm of breast: Secondary | ICD-10-CM

## 2017-09-22 ENCOUNTER — Ambulatory Visit (INDEPENDENT_AMBULATORY_CARE_PROVIDER_SITE_OTHER): Payer: Medicare HMO

## 2017-09-22 DIAGNOSIS — Z1231 Encounter for screening mammogram for malignant neoplasm of breast: Secondary | ICD-10-CM

## 2017-10-09 ENCOUNTER — Ambulatory Visit (INDEPENDENT_AMBULATORY_CARE_PROVIDER_SITE_OTHER): Payer: Medicare HMO | Admitting: Family Medicine

## 2017-10-09 ENCOUNTER — Other Ambulatory Visit: Payer: Self-pay | Admitting: *Deleted

## 2017-10-09 ENCOUNTER — Encounter: Payer: Self-pay | Admitting: Family Medicine

## 2017-10-09 VITALS — BP 133/65 | HR 82 | Ht 67.0 in | Wt 180.0 lb

## 2017-10-09 DIAGNOSIS — E119 Type 2 diabetes mellitus without complications: Secondary | ICD-10-CM

## 2017-10-09 DIAGNOSIS — H65191 Other acute nonsuppurative otitis media, right ear: Secondary | ICD-10-CM

## 2017-10-09 DIAGNOSIS — R5383 Other fatigue: Secondary | ICD-10-CM

## 2017-10-09 DIAGNOSIS — R69 Illness, unspecified: Secondary | ICD-10-CM | POA: Diagnosis not present

## 2017-10-09 LAB — POCT GLYCOSYLATED HEMOGLOBIN (HGB A1C): HEMOGLOBIN A1C: 6.7 % — AB (ref 4.0–5.6)

## 2017-10-09 MED ORDER — ONETOUCH DELICA LANCETS 33G MISC: 4 refills | Status: DC

## 2017-10-09 NOTE — Progress Notes (Signed)
Subjective:    CC: DM  HPI:  Diabetes - no hypoglycemic events. No wounds or sores that are not healing well. No increased thirst or urination. Checking glucose at home.  She actually stopped her metformin at the end of July.  She would have severe abdominal cramps followed by diarrhea and in between would not have a bowel movement so is almost like she felt like she was constipated and having diarrhea.  Since stopping the medication all this resolved.  She is feeling much better.  She really wants to work on her diet and exercise to control her blood sugars.  She is been starting to exercise more regularly and 5-minute intervals throughout the day.  Overall she reports that her fatigue is much better than it was is not completely resolved but she does feel like she is making some improvement.  She also wants me to look in her ears today.  [place] and just feels like there is been a little bit of fullness and occasional popping ever since then.  She did take some Claritin for a few days.  Sure if it really helped or not.  Past medical history, Surgical history, Family history not pertinant except as noted below, Social history, Allergies, and medications have been entered into the medical record, reviewed, and corrections made.   Review of Systems: No fevers, chills, night sweats, weight loss, chest pain, or shortness of breath.   Objective:    General: Well Developed, well nourished, and in no acute distress.  Neuro: Alert and oriented x3, extra-ocular muscles intact, sensation grossly intact.  HEENT: Normocephalic, atraumatic, TMs and canals are clear bilaterally though she does have a couple of fluid bubbles behind her right tympanic membrane. Skin: Warm and dry, no rashes. Cardiac: Regular rate and rhythm, no murmurs rubs or gallops, no lower extremity edema. No carotid bruits Respiratory: Clear to auscultation bilaterally. Not using accessory muscles, speaking in  full sentences.   Impression and Recommendations:    DM -controlled.  Hemoglobin A1c did go up a little bit though to 6.7 which is not unexpected being that she has been off of her metformin for 2 months.  We discussed options.  She really wants to work on diet and exercise for the next 3 months before starting a new medication.  I am certainly open to allowing her to do so but did discuss that we really need to follow back up in 3 months instead of 4 months so that we can keep a close eye on this.  Fluid behind her right tympanic membrane-discussed a trial of continuing either with her oral antihistamine or adding a nasal steroid spray.  If not improving then please let me know.  Fatigue - improving.   Declined tetanus, declined Pneumovax, declined flu vaccine today.

## 2017-10-25 DIAGNOSIS — R69 Illness, unspecified: Secondary | ICD-10-CM | POA: Diagnosis not present

## 2017-11-01 DIAGNOSIS — E119 Type 2 diabetes mellitus without complications: Secondary | ICD-10-CM | POA: Diagnosis not present

## 2017-11-01 DIAGNOSIS — H5200 Hypermetropia, unspecified eye: Secondary | ICD-10-CM | POA: Diagnosis not present

## 2017-11-01 LAB — HM DIABETES EYE EXAM

## 2018-01-08 ENCOUNTER — Encounter: Payer: Self-pay | Admitting: Family Medicine

## 2018-01-08 ENCOUNTER — Ambulatory Visit (INDEPENDENT_AMBULATORY_CARE_PROVIDER_SITE_OTHER): Payer: Medicare HMO | Admitting: Family Medicine

## 2018-01-08 VITALS — BP 123/71 | HR 87 | Ht 67.0 in | Wt 177.0 lb

## 2018-01-08 DIAGNOSIS — J069 Acute upper respiratory infection, unspecified: Secondary | ICD-10-CM

## 2018-01-08 DIAGNOSIS — E119 Type 2 diabetes mellitus without complications: Secondary | ICD-10-CM | POA: Diagnosis not present

## 2018-01-08 LAB — POCT GLYCOSYLATED HEMOGLOBIN (HGB A1C): Hemoglobin A1C: 6.4 % — AB (ref 4.0–5.6)

## 2018-01-08 NOTE — Patient Instructions (Signed)
Viral Respiratory Infection  A viral respiratory infection is an illness that affects parts of the body that are used for breathing. These include the lungs, nose, and throat. It is caused by a germ called a virus.  Some examples of this kind of infection are:  · A cold.  · The flu (influenza).  · A respiratory syncytial virus (RSV) infection.  A person who gets this illness may have the following symptoms:  · A stuffy or runny nose.  · Yellow or green fluid in the nose.  · A cough.  · Sneezing.  · Tiredness (fatigue).  · Achy muscles.  · A sore throat.  · Sweating or chills.  · A fever.  · A headache.  Follow these instructions at home:  Managing pain and congestion  · Take over-the-counter and prescription medicines only as told by your doctor.  · If you have a sore throat, gargle with salt water. Do this 3-4 times per day or as needed. To make a salt-water mixture, dissolve ½-1 tsp of salt in 1 cup of warm water. Make sure that all the salt dissolves.  · Use nose drops made from salt water. This helps with stuffiness (congestion). It also helps soften the skin around your nose.  · Drink enough fluid to keep your pee (urine) pale yellow.  General instructions    · Rest as much as possible.  · Do not drink alcohol.  · Do not use any products that have nicotine or tobacco, such as cigarettes and e-cigarettes. If you need help quitting, ask your doctor.  · Keep all follow-up visits as told by your doctor. This is important.  How is this prevented?    · Get a flu shot every year. Ask your doctor when you should get your flu shot.  · Do not let other people get your germs. If you are sick:  ? Stay home from work or school.  ? Wash your hands with soap and water often. Wash your hands after you cough or sneeze. If soap and water are not available, use hand sanitizer.  · Avoid contact with people who are sick during cold and flu season. This is in fall and winter.  Get help if:  · Your symptoms last for 10 days or  longer.  · Your symptoms get worse over time.  · You have a fever.  · You have very bad pain in your face or forehead.  · Parts of your jaw or neck become very swollen.  Get help right away if:  · You feel pain or pressure in your chest.  · You have shortness of breath.  · You faint or feel like you will faint.  · You keep throwing up (vomiting).  · You feel confused.  Summary  · A viral respiratory infection is an illness that affects parts of the body that are used for breathing.  · Examples of this illness include a cold, the flu, and respiratory syncytial virus (RSV) infection.  · The infection can cause a runny nose, cough, sneezing, sore throat, and fever.  · Follow what your doctor tells you about taking medicines, drinking lots of fluid, washing your hands, resting at home, and avoiding people who are sick.  This information is not intended to replace advice given to you by your health care provider. Make sure you discuss any questions you have with your health care provider.  Document Released: 12/10/2007 Document Revised: 02/06/2017 Document Reviewed: 02/06/2017  Elsevier   Interactive Patient Education © 2019 [employer]

## 2018-01-08 NOTE — Progress Notes (Signed)
Subjective:    CC: DM  HPI:  Diabetes - no hypoglycemic events. No wounds or sores that are not healing well. No increased thirst or urination. Checking glucose at home. Taking medications as prescribed without any side effects.  She has discontinue her metformin bc of side effects.  She felt it caused constipation. She has been using supplements and diet to control her glucose.    She also reports feeling sick for the last 2 days.  She is had some sinus congestions sore throat, some voice change.  She is also had a little bit of cracking and popping in her left ear which she says is actually been going on since her last respiratory infection.  No fevers chills or sweats.  She is just been trying to rest.  Past medical history, Surgical history, Family history not pertinant except as noted below, Social history, Allergies, and medications have been entered into the medic al record, reviewed, and corrections made.   Review of Systems: No fevers, chills, night sweats, weight loss, chest pain, or shortness of breath.   Objective:    General: Well Developed, well nourished, and in no acute distress.  Neuro: Alert and oriented x3, extra-ocular muscles intact, sensation grossly intact.  HEENT: Normocephalic, atraumatic, OP is clear, TMs and canals are clear bilaterally.  Skin: Warm and dry, no rashes. Cardiac: Regular rate and rhythm, no murmurs rubs or gallops, no lower extremity edema.  Respiratory: Clear to auscultation bilaterally. Not using accessory muscles, speaking in full sentences.   Impression and Recommendations:   DM - A1C is 6.4 today. Up from previus in the spring but overall still well controlled.    Viral URI - likely viral.  Recommend symptomatic care.  Call if not improving over the next week.

## 2018-01-11 ENCOUNTER — Encounter: Payer: Self-pay | Admitting: Family Medicine

## 2018-01-14 DIAGNOSIS — R69 Illness, unspecified: Secondary | ICD-10-CM | POA: Diagnosis not present

## 2018-01-15 DIAGNOSIS — E119 Type 2 diabetes mellitus without complications: Secondary | ICD-10-CM | POA: Diagnosis not present

## 2018-01-15 DIAGNOSIS — R69 Illness, unspecified: Secondary | ICD-10-CM | POA: Diagnosis not present

## 2018-01-16 LAB — BASIC METABOLIC PANEL WITH GFR
BUN: 15 mg/dL (ref 7–25)
CO2: 26 mmol/L (ref 20–32)
Calcium: 9.3 mg/dL (ref 8.6–10.4)
Chloride: 103 mmol/L (ref 98–110)
Creat: 0.7 mg/dL (ref 0.50–0.99)
GFR, Est African American: 104 mL/min/{1.73_m2} (ref >=60)
GFR, Est Non African American: 90 mL/min/{1.73_m2} (ref >=60)
Glucose, Bld: 134 mg/dL — ABNORMAL HIGH (ref 65–99)
Potassium: 4.4 mmol/L (ref 3.5–5.3)
Sodium: 137 mmol/L (ref 135–146)

## 2018-01-16 NOTE — Progress Notes (Signed)
All labs are normal. 

## 2018-04-13 DIAGNOSIS — R69 Illness, unspecified: Secondary | ICD-10-CM | POA: Diagnosis not present

## 2018-05-28 ENCOUNTER — Other Ambulatory Visit: Payer: Self-pay

## 2018-05-28 DIAGNOSIS — R69 Illness, unspecified: Secondary | ICD-10-CM | POA: Diagnosis not present

## 2018-05-28 DIAGNOSIS — E119 Type 2 diabetes mellitus without complications: Secondary | ICD-10-CM

## 2018-05-28 MED ORDER — ONETOUCH DELICA LANCETS 33G MISC: 4 refills | Status: DC

## 2018-05-28 MED ORDER — GLUCOSE BLOOD VI STRP: ORAL_STRIP | 11 refills | Status: DC

## 2018-06-28 DIAGNOSIS — R32 Unspecified urinary incontinence: Secondary | ICD-10-CM | POA: Diagnosis not present

## 2018-06-28 DIAGNOSIS — Z85828 Personal history of other malignant neoplasm of skin: Secondary | ICD-10-CM | POA: Diagnosis not present

## 2018-06-28 DIAGNOSIS — J309 Allergic rhinitis, unspecified: Secondary | ICD-10-CM | POA: Diagnosis not present

## 2018-06-28 DIAGNOSIS — Z880 Allergy status to penicillin: Secondary | ICD-10-CM | POA: Diagnosis not present

## 2018-06-28 DIAGNOSIS — E1165 Type 2 diabetes mellitus with hyperglycemia: Secondary | ICD-10-CM | POA: Diagnosis not present

## 2018-06-28 DIAGNOSIS — Z881 Allergy status to other antibiotic agents status: Secondary | ICD-10-CM | POA: Diagnosis not present

## 2018-06-28 DIAGNOSIS — Z853 Personal history of malignant neoplasm of breast: Secondary | ICD-10-CM | POA: Diagnosis not present

## 2018-07-10 ENCOUNTER — Encounter: Payer: Self-pay | Admitting: Family Medicine

## 2018-07-10 ENCOUNTER — Ambulatory Visit (INDEPENDENT_AMBULATORY_CARE_PROVIDER_SITE_OTHER): Payer: Medicare HMO | Admitting: Family Medicine

## 2018-07-10 VITALS — BP 131/73 | HR 95 | Ht 67.0 in | Wt 182.0 lb

## 2018-07-10 DIAGNOSIS — L989 Disorder of the skin and subcutaneous tissue, unspecified: Secondary | ICD-10-CM | POA: Diagnosis not present

## 2018-07-10 DIAGNOSIS — B079 Viral wart, unspecified: Secondary | ICD-10-CM | POA: Diagnosis not present

## 2018-07-10 DIAGNOSIS — R809 Proteinuria, unspecified: Secondary | ICD-10-CM | POA: Diagnosis not present

## 2018-07-10 DIAGNOSIS — E1129 Type 2 diabetes mellitus with other diabetic kidney complication: Secondary | ICD-10-CM

## 2018-07-10 DIAGNOSIS — E119 Type 2 diabetes mellitus without complications: Secondary | ICD-10-CM

## 2018-07-10 DIAGNOSIS — E118 Type 2 diabetes mellitus with unspecified complications: Secondary | ICD-10-CM

## 2018-07-10 LAB — POCT UA - MICROALBUMIN
Creatinine, POC: 300 mg/dL
Microalbumin Ur, POC: 150 mg/L

## 2018-07-10 LAB — POCT GLYCOSYLATED HEMOGLOBIN (HGB A1C): Hemoglobin A1C: 6.9 % — AB (ref 4.0–5.6)

## 2018-07-10 NOTE — Patient Instructions (Signed)
Okay to remove the bandage tomorrow.  Okay to get wet in the shower.  Just pat dry after your shower and apply a small dab of Vaseline twice a day for 2 weeks.  Call if any concerns about how the wound is looking.  Do not use any alcohol or peroxide products on it.

## 2018-07-10 NOTE — Assessment & Plan Note (Signed)
Urine microalbumin was also positive so we will need to keep an eye on that and if her A1c comes back down we will recheck that as well if not we discussed potential addition of an ACE inhibitor, which again she would prefer to avoid.

## 2018-07-10 NOTE — Progress Notes (Signed)
 Established Patient Office Visit  Subjective:  Patient ID: Janet Mccullough, female    DOB: 07/16/1950  Age: 68 y.o. MRN: [id number]  CC:  Chief Complaint  Patient presents with  . Diabetes    HPI Janet Mccullough presents for   Diabetes - no hypoglycemic events. No wounds or sores that are not healing well. No increased thirst or urination. Checking glucose at home. Taking medications as prescribed without any side effects.  She went [place].  She is feeling Ok and denies any symptoms.     She also has a skin lesion on the dorsum of her left forearm that she would like me to look at today.  It is been crusty.  Sometimes she can scrub at it and smooth it down just a little bit but it never goes completely away.  She is had a pre-skin cancer on that back of the hand on that left arm as well previously.  No bleeding or itching.   Past Medical History:  Diagnosis Date  . Cancer (HCC)    breast  . Impaired fasting glucose   . Microscopic hematuria    renal ultrasound normal (Dr Dahlstedt)  . Normal echocardiogram    EF 60-65%    Past Surgical History:  Procedure Laterality Date  . ABDOMINAL HYSTERECTOMY  7/03   for DUB/ cysts  . masectomy    . MASTECTOMY Left   . uterine cyst removed  5/99    Family History  Problem Relation Age of Onset  . Diabetes Father   . Heart disease Father   . Diabetes Brother   . Diabetes Brother        type 2    Social History   Socioeconomic History  . Marital status: Widowed    Spouse name: Not on file  . Number of children: Not on file  . Years of education: Not on file  . Highest education level: Not on file  Occupational History  . Not on file  Social Needs  . Financial resource strain: Not on file  . Food insecurity    Worry: Not on file    Inability: Not on file  . Transportation needs    Medical: Not on file    Non-medical: Not on file  Tobacco Use  . Smoking status: Never Smoker  .  Smokeless tobacco: Never Used  Substance and Sexual Activity  . Alcohol use: No  . Drug use: No  . Sexual activity: Not on file  Lifestyle  . Physical activity    Days per week: Not on file    Minutes per session: Not on file  . Stress: Not on file  Relationships  . Social connections    Talks on phone: Not on file    Gets together: Not on file    Attends religious service: Not on file    Active member of club or organization: Not on file    Attends meetings of clubs or organizations: Not on file    Relationship status: Not on file  . Intimate partner violence    Fear of current or ex partner: Not on file    Emotionally abused: Not on file    Physically abused: Not on file    Forced sexual activity: Not on file  Other Topics Concern  . Not on file  Social History Narrative  . Not on file    Outpatient Medications Prior to Visit  Medication Sig Dispense   Refill  . Ascorbic Acid (SUNKIST VITAMIN C PO) Take 500 mg by mouth daily.      . Barberry-Oreg Grape-Goldenseal (BERBERINE COMPLEX PO) Take by mouth.    . Blood Glucose Monitoring Suppl (ONE TOUCH ULTRA SYSTEM KIT) W/DEVICE KIT 1 kit by Does not apply route once. Lancets, test strips for testing 3 times a week. Dx: IFG Dx code: R73.01 1 each 0  . cetirizine (ZYRTEC) 10 MG tablet Take 10 mg by mouth daily.      . cholecalciferol (VITAMIN D) 1000 UNITS tablet Take 1,000 Units by mouth daily.    . glucose blood (ONE TOUCH ULTRA TEST) test strip Dx: E11.9. Check fasting blood sugar daily. 100 each 11  . ipratropium (ATROVENT) 0.03 % nasal spray Place 2 sprays into both nostrils 4 (four) times daily. 30 mL 0  . MAGNESIUM PO Take by mouth daily.    . Omega-3 Fatty Acids (OMEGA 3 PO) Take by mouth daily.    . OneTouch Delica Lancets 33G MISC Dx E11.9. Inject into skin every morning. 100 each 4  . TURMERIC PO Take by mouth.     No facility-administered medications prior to visit.     Allergies  Allergen Reactions  . Metformin  And Related Other (See Comments)    Joint pain and constipation  . Moxifloxacin Hcl In Nacl Hives  . Penicillins Hives    REACTION: unknown rx  . Quinolones Hives    In particular Avelox    ROS Review of Systems    Objective:    Physical Exam  BP 131/73   Pulse 95   Ht 5' 7" (1.702 m)   Wt 182 lb (82.6 kg)   SpO2 96%   BMI 28.51 kg/m  Wt Readings from Last 3 Encounters:  07/10/18 182 lb (82.6 kg)  01/08/18 177 lb (80.3 kg)  10/09/17 180 lb (81.6 kg)     Health Maintenance Due  Topic Date Due  . FOOT EXAM  03/07/2018  . URINE MICROALBUMIN  06/09/2018  . HEMOGLOBIN A1C  07/10/2018    There are no preventive care reminders to display for this patient.  Lab Results  Component Value Date   TSH 2.60 07/29/2015   Lab Results  Component Value Date   WBC 5.3 07/29/2015   HGB 14.0 07/29/2015   HCT 41.2 07/29/2015   MCV 90.4 07/29/2015   PLT 271 07/29/2015   Lab Results  Component Value Date   NA 137 01/15/2018   K 4.4 01/15/2018   CO2 26 01/15/2018   GLUCOSE 134 (H) 01/15/2018   BUN 15 01/15/2018   CREATININE 0.70 01/15/2018   BILITOT 0.6 06/08/2017   ALKPHOS 54 05/05/2016   AST 11 06/08/2017   ALT 16 06/08/2017   PROT 6.8 06/08/2017   ALBUMIN 4.3 05/05/2016   CALCIUM 9.3 01/15/2018   Lab Results  Component Value Date   CHOL 190 06/08/2017   Lab Results  Component Value Date   HDL 70 06/08/2017   Lab Results  Component Value Date   LDLCALC 102 (H) 06/08/2017   Lab Results  Component Value Date   TRIG 87 06/08/2017   Lab Results  Component Value Date   CHOLHDL 2.7 06/08/2017   Lab Results  Component Value Date   HGBA1C 6.9 (A) 07/10/2018      Assessment & Plan:   Problem List Items Addressed This Visit      Endocrine   Microalbuminuria due to type 2 diabetes mellitus (HCC)       Urine microalbumin was also positive so we will need to keep an eye on that and if her A1c comes back down we will recheck that as well if not we  discussed potential addition of an ACE inhibitor, which again she would prefer to avoid.      Controlled diabetes mellitus type 2 with complications (HCC) - Primary    A1c still under 7.0 but significantly higher than previous.  We discussed that if she cannot keep her A1c at 6.5 or less then we need to consider prescription medication.  She is worked really hard to try to keep it controlled without medicine and feels very strongly about not taking medication.  She is willing to work on it so I will see her back in 3 to 4 months and we will recheck it at that time.  Urine microalbumin was also positive so we will need to keep an eye on that and if her A1c comes back down we will recheck that as well if not we discussed potential addition of an ACE inhibitor, which again she would prefer to avoid.       Other Visit Diagnoses    Skin lesion of left arm       Relevant Orders   Dermatology pathology      Skin lesion-concerning for actinic keratosis versus early squamous cell.  Shave biopsy recommended.  See procedure note below.  Patient tolerated well.  Shave Biopsy Procedure Note  Pre-operative Diagnosis: Suspicious lesion  Post-operative Diagnosis: same  Locations:left posterior forearm  Indications: not healing.   Anesthesia: Lidocaine 1% without epinephrine without added sodium bicarbonate  Procedure Details  Patient informed of the risks (including bleeding and infection) and benefits of the  procedure and Verbal informed consent obtained.  The lesion and surrounding area were given a sterile prep using alcohol and draped in the usual sterile fashion. A scalpel was used to shave an area of skin approximately 21m by 714m  Hemostasis achieved with alumuninum chloride. Antibiotic ointment and a sterile dressing applied.  The specimen was sent for pathologic examination. The patient tolerated the procedure well.  EBL: trace bleeding.   Findings: awaith  pathology  Condition: Stable  Complications: none.  Plan: 1. Instructed to keep the wound dry and covered for 24-48h and clean thereafter. 2. Warning signs of infection were reviewed.   3. Recommended that the patient use OTC acetaminophen as needed for pain.  4. Return PRN.   No orders of the defined types were placed in this encounter.   Follow-up: Return in about 3 months (around 10/10/2018) for Diabetes follow-up.    CaBeatrice LecherMD

## 2018-07-10 NOTE — Assessment & Plan Note (Signed)
A1c still under 7.0 but significantly higher than previous.  We discussed that if she cannot keep her A1c at 6.5 or less then we need to consider prescription medication.  She is worked really hard to try to keep it controlled without medicine and feels very strongly about not taking medication.  She is willing to work on it so I will see her back in 3 to 4 months and we will recheck it at that time.  Urine microalbumin was also positive so we will need to keep an eye on that and if her A1c comes back down we will recheck that as well if not we discussed potential addition of an ACE inhibitor, which again she would prefer to avoid.

## 2018-07-23 DIAGNOSIS — C50912 Malignant neoplasm of unspecified site of left female breast: Secondary | ICD-10-CM | POA: Diagnosis not present

## 2018-08-10 ENCOUNTER — Other Ambulatory Visit: Payer: Self-pay | Admitting: Family Medicine

## 2018-08-10 DIAGNOSIS — Z1231 Encounter for screening mammogram for malignant neoplasm of breast: Secondary | ICD-10-CM

## 2018-09-06 DIAGNOSIS — R69 Illness, unspecified: Secondary | ICD-10-CM | POA: Diagnosis not present

## 2018-09-20 DIAGNOSIS — E119 Type 2 diabetes mellitus without complications: Secondary | ICD-10-CM | POA: Diagnosis not present

## 2018-09-21 LAB — LIPID PANEL
Cholesterol: 189 mg/dL (ref <200)
HDL: 71 mg/dL (ref >=50)
LDL Cholesterol (Calc): 97 mg/dL (calc)
Non-HDL Cholesterol (Calc): 118 mg/dL (calc) (ref <130)
Total CHOL/HDL Ratio: 2.7 (calc) (ref <5.0)
Triglycerides: 116 mg/dL (ref <150)

## 2018-09-21 LAB — COMPLETE METABOLIC PANEL WITH GFR
AG Ratio: 1.6 (calc) (ref 1.0–2.5)
ALT: 14 U/L (ref 6–29)
AST: 9 U/L — ABNORMAL LOW (ref 10–35)
Albumin: 4.4 g/dL (ref 3.6–5.1)
Alkaline phosphatase (APISO): 55 U/L (ref 37–153)
BUN: 13 mg/dL (ref 7–25)
CO2: 26 mmol/L (ref 20–32)
Calcium: 9.7 mg/dL (ref 8.6–10.4)
Chloride: 104 mmol/L (ref 98–110)
Creat: 0.82 mg/dL (ref 0.50–0.99)
GFR, Est African American: 85 mL/min/{1.73_m2} (ref >=60)
GFR, Est Non African American: 74 mL/min/{1.73_m2} (ref >=60)
Globulin: 2.7 g/dL (calc) (ref 1.9–3.7)
Glucose, Bld: 161 mg/dL — ABNORMAL HIGH (ref 65–99)
Potassium: 4.2 mmol/L (ref 3.5–5.3)
Sodium: 140 mmol/L (ref 135–146)
Total Bilirubin: 0.7 mg/dL (ref 0.2–1.2)
Total Protein: 7.1 g/dL (ref 6.1–8.1)

## 2018-10-10 ENCOUNTER — Other Ambulatory Visit: Payer: Self-pay

## 2018-10-10 ENCOUNTER — Ambulatory Visit (INDEPENDENT_AMBULATORY_CARE_PROVIDER_SITE_OTHER): Payer: Medicare HMO | Admitting: Family Medicine

## 2018-10-10 ENCOUNTER — Encounter: Payer: Self-pay | Admitting: Family Medicine

## 2018-10-10 VITALS — BP 114/66 | HR 85 | Ht 67.0 in | Wt 177.0 lb

## 2018-10-10 DIAGNOSIS — R809 Proteinuria, unspecified: Secondary | ICD-10-CM

## 2018-10-10 DIAGNOSIS — E118 Type 2 diabetes mellitus with unspecified complications: Secondary | ICD-10-CM

## 2018-10-10 DIAGNOSIS — E1129 Type 2 diabetes mellitus with other diabetic kidney complication: Secondary | ICD-10-CM | POA: Diagnosis not present

## 2018-10-10 DIAGNOSIS — M8580 Other specified disorders of bone density and structure, unspecified site: Secondary | ICD-10-CM | POA: Diagnosis not present

## 2018-10-10 LAB — POCT GLYCOSYLATED HEMOGLOBIN (HGB A1C): Hemoglobin A1C: 6.9 % — AB (ref 4.0–5.6)

## 2018-10-10 LAB — POCT UA - MICROALBUMIN
Creatinine, POC: 300 mg/dL
Microalbumin Ur, POC: 80 mg/L

## 2018-10-10 NOTE — Assessment & Plan Note (Signed)
A1c looks great today at 6.3.  She made significant improvement in her A1c with some increased activity level and some dietary changes that looks phenomenal.  Just encouraged her to keep up the great work.  She is not currently on medications.  Declines a statin.  Follow-up in 4 months.

## 2018-10-10 NOTE — Assessment & Plan Note (Signed)
Due for repeat DEXA.  Last one was approximately 7 years ago.

## 2018-10-10 NOTE — Progress Notes (Signed)
Established Patient Office Visit  Subjective:  Patient ID: Janet Mccullough, female    DOB: 11-07-1950  Age: 68 y.o. MRN: [id number]  CC:  Chief Complaint  Patient presents with  . Diabetes    HPI Janet Mccullough presents for   Diabetes - no hypoglycemic events. No wounds or sores that are not healing well. No increased thirst or urination. Checking glucose at home. She is not taking medications and is diet controlled.  He says he is really just been trying to move more and be more active.  She is also try to pay more attention which foods are really bumping her blood sugar and has run to just stay away from those spells possible.  She also has a positive urine microalbumin.  We discussed rechecking that when she was able to get her A1c back down into a reasonable range.  Bone mineral density per our records was February 25, 2011.  She is cannot remember if she is had one since then.  With her OB/GYN in [place].  She does take a vitamin D supplement daily.   Past Medical History:  Diagnosis Date  . Cancer (HCC)    breast  . Impaired fasting glucose   . Microscopic hematuria    renal ultrasound normal (Dr Diona Fanti)  . Normal echocardiogram    EF 60-65%    Past Surgical History:  Procedure Laterality Date  . ABDOMINAL HYSTERECTOMY  7/03   for DUB/ cysts  . masectomy    . MASTECTOMY Left   . uterine cyst removed  5/99    Family History  Problem Relation Age of Onset  . Diabetes Father   . Heart disease Father   . Diabetes Brother   . Diabetes Brother        type 2    Social History   Socioeconomic History  . Marital status: Widowed    Spouse name: Not on file  . Number of children: Not on file  . Years of education: Not on file  . Highest education level: Not on file  Occupational History  . Not on file  Social Needs  . Financial resource strain: Not on file  . Food insecurity    Worry: Not on file    Inability: Not on file  . Transportation needs     Medical: Not on file    Non-medical: Not on file  Tobacco Use  . Smoking status: Never Smoker  . Smokeless tobacco: Never Used  Substance and Sexual Activity  . Alcohol use: No  . Drug use: No  . Sexual activity: Not on file  Lifestyle  . Physical activity    Days per week: Not on file    Minutes per session: Not on file  . Stress: Not on file  Relationships  . Social Herbalist on phone: Not on file    Gets together: Not on file    Attends religious service: Not on file    Active member of club or organization: Not on file    Attends meetings of clubs or organizations: Not on file    Relationship status: Not on file  . Intimate partner violence    Fear of current or ex partner: Not on file    Emotionally abused: Not on file    Physically abused: Not on file    Forced sexual activity: Not on file  Other Topics Concern  . Not on file  Social History Narrative  . Not on  file    Outpatient Medications Prior to Visit  Medication Sig Dispense Refill  . Ascorbic Acid (SUNKIST VITAMIN C PO) Take 500 mg by mouth daily.      Jolyne Loa Grape-Goldenseal (BERBERINE COMPLEX PO) Take by mouth.    . Blood Glucose Monitoring Suppl (ONE TOUCH ULTRA SYSTEM KIT) W/DEVICE KIT 1 kit by Does not apply route once. Lancets, test strips for testing 3 times a week. Dx: IFG Dx code: R73.01 1 each 0  . cetirizine (ZYRTEC) 10 MG tablet Take 10 mg by mouth daily.      . cholecalciferol (VITAMIN D) 1000 UNITS tablet Take 1,000 Units by mouth daily.    Marland Kitchen glucose blood (ONE TOUCH ULTRA TEST) test strip Dx: E11.9. Check fasting blood sugar daily. 100 each 11  . ipratropium (ATROVENT) 0.03 % nasal spray Place 2 sprays into both nostrils 4 (four) times daily. 30 mL 0  . MAGNESIUM PO Take by mouth daily.    . Omega-3 Fatty Acids (OMEGA 3 PO) Take by mouth daily.    [name] Lancets 57D MISC Dx E11.9. Inject into skin every morning. 100 each 4  . TURMERIC PO Take by mouth.     No  facility-administered medications prior to visit.     Allergies  Allergen Reactions  . Metformin And Related Other (See Comments)    Joint pain and constipation  . Moxifloxacin Hcl In Nacl Hives  . Penicillins Hives    REACTION: unknown rx  . Quinolones Hives    In particular Avelox    ROS Review of Systems    Objective:    Physical Exam  Constitutional: She is oriented to person, place, and time. She appears well-developed and well-nourished.  HENT:  Head: Normocephalic and atraumatic.  Cardiovascular: Normal rate, regular rhythm and normal heart sounds.  Pulmonary/Chest: Effort normal and breath sounds normal.  Neurological: She is alert and oriented to person, place, and time.  Skin: Skin is warm and dry.  Psychiatric: She has a normal mood and affect. Her behavior is normal.    BP 114/66   Pulse 85   Ht 5' 7" (1.702 m)   Wt 177 lb (80.3 kg)   SpO2 99%   BMI 27.72 kg/m  Wt Readings from Last 3 Encounters:  10/10/18 177 lb (80.3 kg)  07/10/18 182 lb (82.6 kg)  01/08/18 177 lb (80.3 kg)     There are no preventive care reminders to display for this patient.  There are no preventive care reminders to display for this patient.  Lab Results  Component Value Date   TSH 2.60 07/29/2015   Lab Results  Component Value Date   WBC 5.3 07/29/2015   HGB 14.0 07/29/2015   HCT 41.2 07/29/2015   MCV 90.4 07/29/2015   PLT 271 07/29/2015   Lab Results  Component Value Date   NA 140 09/20/2018   K 4.2 09/20/2018   CO2 26 09/20/2018   GLUCOSE 161 (H) 09/20/2018   BUN 13 09/20/2018   CREATININE 0.82 09/20/2018   BILITOT 0.7 09/20/2018   ALKPHOS 54 05/05/2016   AST 9 (L) 09/20/2018   ALT 14 09/20/2018   PROT 7.1 09/20/2018   ALBUMIN 4.3 05/05/2016   CALCIUM 9.7 09/20/2018   Lab Results  Component Value Date   CHOL 189 09/20/2018   Lab Results  Component Value Date   HDL 71 09/20/2018   Lab Results  Component Value Date   LDLCALC 97 09/20/2018    Lab  Results  Component Value Date   TRIG 116 09/20/2018   Lab Results  Component Value Date   CHOLHDL 2.7 09/20/2018   Lab Results  Component Value Date   HGBA1C 6.9 (A) 10/10/2018      Assessment & Plan:   Problem List Items Addressed This Visit      Endocrine   Microalbuminuria due to type 2 diabetes mellitus ([place])    Discussed that she is still having some persistent proteinuria even after getting her A1c down.  Would strongly recommend an ACE inhibitor to help protect the kidneys.  We discussed this today.  Encouraged her to think about it and as she has been very hesitant to take any type of prescription medication but strongly encouraged her to continue to think about it and let me know what she would like to do.      Controlled diabetes mellitus type 2 with complications (HCC) - Primary    A1c looks great today at 6.3.  She made significant improvement in her A1c with some increased activity level and some dietary changes that looks phenomenal.  Just encouraged her to keep up the great work.  She is not currently on medications.  Declines a statin.  Follow-up in 4 months.      Relevant Orders   POCT glycosylated hemoglobin (Hb A1C) (Completed)   POCT UA - Microalbumin (Completed)     Musculoskeletal and Integument   Osteopenia    Due for repeat DEXA.  Last one was approximately 7 years ago.      Relevant Orders   DG Bone Density      No orders of the defined types were placed in this encounter.   Follow-up: Return in about 4 months (around 02/09/2019) for Diabetes follow-up.    Beatrice Lecher, MD

## 2018-10-10 NOTE — Assessment & Plan Note (Signed)
Discussed that she is still having some persistent proteinuria even after getting her A1c down.  Would strongly recommend an ACE inhibitor to help protect the kidneys.  We discussed this today.  Encouraged her to think about it and as she has been very hesitant to take any type of prescription medication but strongly encouraged her to continue to think about it and let me know what she would like to do.

## 2018-10-11 ENCOUNTER — Other Ambulatory Visit: Payer: Self-pay | Admitting: Family Medicine

## 2018-10-11 ENCOUNTER — Ambulatory Visit (INDEPENDENT_AMBULATORY_CARE_PROVIDER_SITE_OTHER): Payer: Medicare HMO

## 2018-10-11 DIAGNOSIS — Z1231 Encounter for screening mammogram for malignant neoplasm of breast: Secondary | ICD-10-CM

## 2018-10-24 ENCOUNTER — Other Ambulatory Visit: Payer: Self-pay

## 2018-10-24 ENCOUNTER — Ambulatory Visit (INDEPENDENT_AMBULATORY_CARE_PROVIDER_SITE_OTHER): Payer: Medicare HMO

## 2018-10-24 DIAGNOSIS — Z780 Asymptomatic menopausal state: Secondary | ICD-10-CM | POA: Diagnosis not present

## 2018-10-24 DIAGNOSIS — M8580 Other specified disorders of bone density and structure, unspecified site: Secondary | ICD-10-CM | POA: Diagnosis not present

## 2018-10-24 DIAGNOSIS — M8589 Other specified disorders of bone density and structure, multiple sites: Secondary | ICD-10-CM | POA: Diagnosis not present

## 2018-12-12 DIAGNOSIS — R69 Illness, unspecified: Secondary | ICD-10-CM | POA: Diagnosis not present

## 2019-01-21 DIAGNOSIS — E119 Type 2 diabetes mellitus without complications: Secondary | ICD-10-CM | POA: Diagnosis not present

## 2019-01-21 DIAGNOSIS — Z135 Encounter for screening for eye and ear disorders: Secondary | ICD-10-CM | POA: Diagnosis not present

## 2019-01-21 DIAGNOSIS — H2513 Age-related nuclear cataract, bilateral: Secondary | ICD-10-CM | POA: Diagnosis not present

## 2019-01-21 DIAGNOSIS — H5200 Hypermetropia, unspecified eye: Secondary | ICD-10-CM | POA: Diagnosis not present

## 2019-01-21 LAB — HM DIABETES EYE EXAM

## 2019-02-08 ENCOUNTER — Ambulatory Visit (INDEPENDENT_AMBULATORY_CARE_PROVIDER_SITE_OTHER): Payer: Medicare HMO | Admitting: Family Medicine

## 2019-02-08 ENCOUNTER — Ambulatory Visit: Payer: Medicare HMO | Admitting: Family Medicine

## 2019-02-08 ENCOUNTER — Other Ambulatory Visit: Payer: Self-pay

## 2019-02-08 VITALS — BP 136/63 | HR 81 | Wt 182.0 lb

## 2019-02-08 DIAGNOSIS — R69 Illness, unspecified: Secondary | ICD-10-CM | POA: Diagnosis not present

## 2019-02-08 DIAGNOSIS — E1165 Type 2 diabetes mellitus with hyperglycemia: Secondary | ICD-10-CM

## 2019-02-08 DIAGNOSIS — E119 Type 2 diabetes mellitus without complications: Secondary | ICD-10-CM | POA: Diagnosis not present

## 2019-02-08 DIAGNOSIS — H938X3 Other specified disorders of ear, bilateral: Secondary | ICD-10-CM | POA: Diagnosis not present

## 2019-02-08 DIAGNOSIS — E118 Type 2 diabetes mellitus with unspecified complications: Secondary | ICD-10-CM

## 2019-02-08 LAB — POCT GLYCOSYLATED HEMOGLOBIN (HGB A1C): Hemoglobin A1C: 7.2 % — AB (ref 4.0–5.6)

## 2019-02-08 MED ORDER — ONETOUCH DELICA LANCETS 33G MISC: 4 refills | Status: DC

## 2019-02-08 NOTE — Progress Notes (Signed)
Established Patient Office Visit  Subjective:  Patient ID: Janet Mccullough, female    DOB: 10/31/1950  Age: 69 y.o. MRN: [id number]  CC:  Chief Complaint  Patient presents with  . Diabetes    RBS yesterday evening was 140    HPI Janet Mccullough presents for   Diabetes - no hypoglycemic events. No wounds or sores that are not healing well. No increased thirst or urination. Checking glucose at home.  She is not currently on any medications and declines to take a statin.  Also c/o bilateral ear pressure on and off the last couple of months.  Occ runny nose.  Tried claritin for about 2 weeks and that helps some. No drainage or pain.  No COVID exposure.  Left was worse than right.  She feels the left is a little bit more bothersome than the right.   Past Medical History:  Diagnosis Date  . Cancer (HCC)    breast  . Impaired fasting glucose   . Microscopic hematuria    renal ultrasound normal (Dr Diona Fanti)  . Normal echocardiogram    EF 60-65%    Past Surgical History:  Procedure Laterality Date  . ABDOMINAL HYSTERECTOMY  7/03   for DUB/ cysts  . masectomy    . MASTECTOMY Left   . uterine cyst removed  5/99    Family History  Problem Relation Age of Onset  . Diabetes Father   . Heart disease Father   . Diabetes Brother   . Diabetes Brother        type 2    Social History   Socioeconomic History  . Marital status: Widowed    Spouse name: Not on file  . Number of children: Not on file  . Years of education: Not on file  . Highest education level: Not on file  Occupational History  . Not on file  Tobacco Use  . Smoking status: Never Smoker  . Smokeless tobacco: Never Used  Substance and Sexual Activity  . Alcohol use: No  . Drug use: No  . Sexual activity: Not on file  Other Topics Concern  . Not on file  Social History Narrative  . Not on file   Social Determinants of Health   Financial Resource Strain:   . Difficulty of Paying Living Expenses: Not on  file  Food Insecurity:   . Worried About Charity fundraiser in the Last Year: Not on file  . Ran Out of Food in the Last Year: Not on file  Transportation Needs:   . Lack of Transportation (Medical): Not on file  . Lack of Transportation (Non-Medical): Not on file  Physical Activity:   . Days of Exercise per Week: Not on file  . Minutes of Exercise per Session: Not on file  Stress:   . Feeling of Stress : Not on file  Social Connections:   . Frequency of Communication with Friends and Family: Not on file  . Frequency of Social Gatherings with Friends and Family: Not on file  . Attends Religious Services: Not on file  . Active Member of Clubs or Organizations: Not on file  . Attends Archivist Meetings: Not on file  . Marital Status: Not on file  Intimate Partner Violence:   . Fear of Current or Ex-Partner: Not on file  . Emotionally Abused: Not on file  . Physically Abused: Not on file  . Sexually Abused: Not on file    Outpatient Medications Prior to Visit  Medication Sig Dispense Refill  . Ascorbic Acid (SUNKIST VITAMIN C PO) Take 500 mg by mouth daily.      Jolyne Loa Grape-Goldenseal (BERBERINE COMPLEX PO) Take by mouth.    . Blood Glucose Monitoring Suppl (ONE TOUCH ULTRA SYSTEM KIT) W/DEVICE KIT 1 kit by Does not apply route once. Lancets, test strips for testing 3 times a week. Dx: IFG Dx code: R73.01 1 each 0  . cetirizine (ZYRTEC) 10 MG tablet Take 10 mg by mouth daily.      . cholecalciferol (VITAMIN D) 1000 UNITS tablet Take 1,000 Units by mouth daily.    Marland Kitchen glucose blood (ONE TOUCH ULTRA TEST) test strip Dx: E11.9. Check fasting blood sugar daily. 100 each 11  . ipratropium (ATROVENT) 0.03 % nasal spray Place 2 sprays into both nostrils 4 (four) times daily. 30 mL 0  . MAGNESIUM PO Take by mouth daily.    . Omega-3 Fatty Acids (OMEGA 3 PO) Take by mouth daily.    . TURMERIC PO Take by mouth.    [name] Lancets 27C MISC Dx E11.9. Inject into  skin every morning. 100 each 4   No facility-administered medications prior to visit.    Allergies  Allergen Reactions  . Metformin And Related Other (See Comments)    Joint pain and constipation  . Moxifloxacin Hcl In Nacl Hives  . Penicillins Hives    REACTION: unknown rx  . Quinolones Hives    In particular Avelox    ROS Review of Systems    Objective:    Physical Exam  Constitutional: She is oriented to person, place, and time. She appears well-developed and well-nourished.  HENT:  Head: Normocephalic and atraumatic.  Right Ear: External ear normal.  Left Ear: External ear normal.  Nose: Nose normal.  Mouth/Throat: Oropharynx is clear and moist.  Dec light reflex in the left ear.    Eyes: Conjunctivae are normal.  Cardiovascular: Normal rate, regular rhythm and normal heart sounds.  Pulmonary/Chest: Effort normal and breath sounds normal.  Neurological: She is alert and oriented to person, place, and time.  Skin: Skin is warm and dry.  Psychiatric: She has a normal mood and affect. Her behavior is normal.    BP 136/63   Pulse 81   Wt 182 lb (82.6 kg)   SpO2 98%   BMI 28.51 kg/m  Wt Readings from Last 3 Encounters:  02/08/19 182 lb (82.6 kg)  10/10/18 177 lb (80.3 kg)  07/10/18 182 lb (82.6 kg)     Health Maintenance Due  Topic Date Due  . TETANUS/TDAP  01/11/2012  . OPHTHALMOLOGY EXAM  11/02/2018    There are no preventive care reminders to display for this patient.  Lab Results  Component Value Date   TSH 2.60 07/29/2015   Lab Results  Component Value Date   WBC 5.3 07/29/2015   HGB 14.0 07/29/2015   HCT 41.2 07/29/2015   MCV 90.4 07/29/2015   PLT 271 07/29/2015   Lab Results  Component Value Date   NA 140 09/20/2018   K 4.2 09/20/2018   CO2 26 09/20/2018   GLUCOSE 161 (H) 09/20/2018   BUN 13 09/20/2018   CREATININE 0.82 09/20/2018   BILITOT 0.7 09/20/2018   ALKPHOS 54 05/05/2016   AST 9 (L) 09/20/2018   ALT 14 09/20/2018   PROT  7.1 09/20/2018   ALBUMIN 4.3 05/05/2016   CALCIUM 9.7 09/20/2018   Lab Results  Component Value Date   CHOL 189 09/20/2018  Lab Results  Component Value Date   HDL 71 09/20/2018   Lab Results  Component Value Date   LDLCALC 97 09/20/2018   Lab Results  Component Value Date   TRIG 116 09/20/2018   Lab Results  Component Value Date   CHOLHDL 2.7 09/20/2018   Lab Results  Component Value Date   HGBA1C 7.2 (A) 02/08/2019      Assessment & Plan:   Problem List Items Addressed This Visit      Endocrine   Uncontrolled diabetes mellitus ([place]) - Primary    Uncontrolled. She doesn't want to be on medication.  She really wants to work on diet and exercise.  F/U in months. Discussed options. Discussed strategies around exercise.  She needs refill on her lancets.        Relevant Medications   OneTouch Delica Lancets 07A MISC    Other Visit Diagnoses    Pressure sensation in both ears       Controlled type 2 diabetes mellitus without complication, without long-term current use of insulin (HCC)       Relevant Medications   OneTouch Delica Lancets 15H MISC      Bilateral pressure in ears -exam is normal today though she does have decreased light reflex over the left tympanic membrane which could indicate a pressure change.  We discussed restarting the Claritin if her fullness returns or trying a nasal steroid spray such as Flonase or new Nasonex.  She says she has tried those in the past but they tend to cause them is like a pimple in her nose and so he quit using it.  Meds ordered this encounter  Medications  . OneTouch Delica Lancets 83U MISC    Sig: Dx E11.9. Inject into skin every morning.    Dispense:  100 each    Refill:  4    Follow-up: Return in about 3 months (around 05/09/2019) for Diabetes follow-up.   Time spent 25 minutes in encounter. Beatrice Lecher, MD

## 2019-02-08 NOTE — Patient Instructions (Signed)
Diabetes Mellitus and Exercise Exercising regularly is important for your overall health, especially when you have diabetes (diabetes mellitus). Exercising is not only about losing weight. It has many other health benefits, such as increasing muscle strength and bone density and reducing body fat and stress. This leads to improved fitness, flexibility, and endurance, all of which result in better overall health. Exercise has additional benefits for people with diabetes, including:  Reducing appetite.  Helping to lower and control blood glucose.  Lowering blood pressure.  Helping to control amounts of fatty substances (lipids) in the blood, such as cholesterol and triglycerides.  Helping the body to respond better to insulin (improving insulin sensitivity).  Reducing how much insulin the body needs.  Decreasing the risk for heart disease by: ? Lowering cholesterol and triglyceride levels. ? Increasing the levels of good cholesterol. ? Lowering blood glucose levels. What is my activity plan? Your health care provider or certified diabetes educator can help you make a plan for the type and frequency of exercise (activity plan) that works for you. Make sure that you:  Do at least 150 minutes of moderate-intensity or vigorous-intensity exercise each week. This could be brisk walking, biking, or water aerobics. ? Do stretching and strength exercises, such as yoga or weightlifting, at least 2 times a week. ? Spread out your activity over at least 3 days of the week.  Get some form of physical activity every day. ? Do not go more than 2 days in a row without some kind of physical activity. ? Avoid being inactive for more than 30 minutes at a time. Take frequent breaks to walk or stretch.  Choose a type of exercise or activity that you enjoy, and set realistic goals.  Start slowly, and gradually increase the intensity of your exercise over time. What do I need to know about managing my  diabetes?   Check your blood glucose before and after exercising. ? If your blood glucose is 240 mg/dL (13.3 mmol/L) or higher before you exercise, check your urine for ketones. If you have ketones in your urine, do not exercise until your blood glucose returns to normal. ? If your blood glucose is 100 mg/dL (5.6 mmol/L) or lower, eat a snack containing 15-20 grams of carbohydrate. Check your blood glucose 15 minutes after the snack to make sure that your level is above 100 mg/dL (5.6 mmol/L) before you start your exercise.  Know the symptoms of low blood glucose (hypoglycemia) and how to treat it. Your risk for hypoglycemia increases during and after exercise. Common symptoms of hypoglycemia can include: ? Hunger. ? Anxiety. ? Sweating and feeling clammy. ? Confusion. ? Dizziness or feeling light-headed. ? Increased heart rate or palpitations. ? Blurry vision. ? Tingling or numbness around the mouth, lips, or tongue. ? Tremors or shakes. ? Irritability.  Keep a rapid-acting carbohydrate snack available before, during, and after exercise to help prevent or treat hypoglycemia.  Avoid injecting insulin into areas of the body that are going to be exercised. For example, avoid injecting insulin into: ? The arms, when playing tennis. ? The legs, when jogging.  Keep records of your exercise habits. Doing this can help you and your health care provider adjust your diabetes management plan as needed. Write down: ? Food that you eat before and after you exercise. ? Blood glucose levels before and after you exercise. ? The type and amount of exercise you have done. ? When your insulin is expected to peak, if you use   insulin. Avoid exercising at times when your insulin is peaking.  When you start a new exercise or activity, work with your health care provider to make sure the activity is safe for you, and to adjust your insulin, medicines, or food intake as needed.  Drink plenty of water while  you exercise to prevent dehydration or heat stroke. Drink enough fluid to keep your urine clear or pale yellow. Summary  Exercising regularly is important for your overall health, especially when you have diabetes (diabetes mellitus).  Exercising has many health benefits, such as increasing muscle strength and bone density and reducing body fat and stress.  Your health care provider or certified diabetes educator can help you make a plan for the type and frequency of exercise (activity plan) that works for you.  When you start a new exercise or activity, work with your health care provider to make sure the activity is safe for you, and to adjust your insulin, medicines, or food intake as needed. This information is not intended to replace advice given to you by your health care provider. Make sure you discuss any questions you have with your health care provider. Document Revised: 07/21/2016 Document Reviewed: 06/08/2015 Elsevier Patient Education  2020 [employer]  

## 2019-02-08 NOTE — Assessment & Plan Note (Signed)
Uncontrolled. She doesn't want to be on medication.  She really wants to work on diet and exercise.  F/U in months. Discussed options. Discussed strategies around exercise.  She needs refill on her lancets.

## 2019-02-11 DIAGNOSIS — R69 Illness, unspecified: Secondary | ICD-10-CM | POA: Diagnosis not present

## 2019-03-22 DIAGNOSIS — R69 Illness, unspecified: Secondary | ICD-10-CM | POA: Diagnosis not present

## 2019-05-08 ENCOUNTER — Ambulatory Visit (INDEPENDENT_AMBULATORY_CARE_PROVIDER_SITE_OTHER): Payer: Medicare HMO | Admitting: Family Medicine

## 2019-05-08 ENCOUNTER — Other Ambulatory Visit: Payer: Self-pay

## 2019-05-08 ENCOUNTER — Encounter: Payer: Self-pay | Admitting: Family Medicine

## 2019-05-08 VITALS — BP 120/61 | HR 73 | Ht 67.0 in | Wt 182.0 lb

## 2019-05-08 DIAGNOSIS — E1165 Type 2 diabetes mellitus with hyperglycemia: Secondary | ICD-10-CM

## 2019-05-08 DIAGNOSIS — E1129 Type 2 diabetes mellitus with other diabetic kidney complication: Secondary | ICD-10-CM | POA: Diagnosis not present

## 2019-05-08 DIAGNOSIS — E118 Type 2 diabetes mellitus with unspecified complications: Secondary | ICD-10-CM | POA: Diagnosis not present

## 2019-05-08 DIAGNOSIS — R809 Proteinuria, unspecified: Secondary | ICD-10-CM | POA: Diagnosis not present

## 2019-05-08 LAB — POCT GLYCOSYLATED HEMOGLOBIN (HGB A1C): Hemoglobin A1C: 7.2 % — AB (ref 4.0–5.6)

## 2019-05-08 NOTE — Progress Notes (Signed)
Established Patient Office Visit  Subjective:  Patient ID: Janet Mccullough, female    DOB: 11/14/1950  Age: 69 y.o. MRN: [id number]  CC:  Chief Complaint  Patient presents with  . Diabetes    HPI Janet Mccullough presents for   Diabetes - no hypoglycemic events. No wounds or sores that are not healing well. No increased thirst or urination. Checking glucose at home.  She admits that diet has not been very consistent she has been trying some to make some changes.  She also has not really increased her activity as she had hoped she would but knows that she needs to get more motivated.  Just struggling with it.   Past Medical History:  Diagnosis Date  . Cancer (HCC)    breast  . Impaired fasting glucose   . Microscopic hematuria    renal ultrasound normal (Dr Diona Fanti)  . Normal echocardiogram    EF 60-65%    Past Surgical History:  Procedure Laterality Date  . ABDOMINAL HYSTERECTOMY  7/03   for DUB/ cysts  . masectomy    . MASTECTOMY Left   . uterine cyst removed  5/99    Family History  Problem Relation Age of Onset  . Diabetes Father   . Heart disease Father   . Diabetes Brother   . Diabetes Brother        type 2    Social History   Socioeconomic History  . Marital status: Widowed    Spouse name: Not on file  . Number of children: Not on file  . Years of education: Not on file  . Highest education level: Not on file  Occupational History  . Not on file  Tobacco Use  . Smoking status: Never Smoker  . Smokeless tobacco: Never Used  Substance and Sexual Activity  . Alcohol use: No  . Drug use: No  . Sexual activity: Not on file  Other Topics Concern  . Not on file  Social History Narrative  . Not on file   Social Determinants of Health   Financial Resource Strain:   . Difficulty of Paying Living Expenses:   Food Insecurity:   . Worried About Charity fundraiser in the Last Year:   . Arboriculturist in the Last Year:   Transportation Needs:   .  Film/video editor (Medical):   Marland Kitchen Lack of Transportation (Non-Medical):   Physical Activity:   . Days of Exercise per Week:   . Minutes of Exercise per Session:   Stress:   . Feeling of Stress :   Social Connections:   . Frequency of Communication with Friends and Family:   . Frequency of Social Gatherings with Friends and Family:   . Attends Religious Services:   . Active Member of Clubs or Organizations:   . Attends Archivist Meetings:   Marland Kitchen Marital Status:   Intimate Partner Violence:   . Fear of Current or Ex-Partner:   . Emotionally Abused:   Marland Kitchen Physically Abused:   . Sexually Abused:     Outpatient Medications Prior to Visit  Medication Sig Dispense Refill  . Ascorbic Acid (SUNKIST VITAMIN C PO) Take 500 mg by mouth daily.      Jolyne Loa Grape-Goldenseal (BERBERINE COMPLEX PO) Take by mouth.    . Blood Glucose Monitoring Suppl (ONE TOUCH ULTRA SYSTEM KIT) W/DEVICE KIT 1 kit by Does not apply route once. Lancets, test strips for testing 3 times a week. Dx: IFG  Dx code: R73.01 1 each 0  . cetirizine (ZYRTEC) 10 MG tablet Take 10 mg by mouth daily.      . cholecalciferol (VITAMIN D) 1000 UNITS tablet Take 1,000 Units by mouth daily.    Marland Kitchen glucose blood (ONE TOUCH ULTRA TEST) test strip Dx: E11.9. Check fasting blood sugar daily. 100 each 11  . ipratropium (ATROVENT) 0.03 % nasal spray Place 2 sprays into both nostrils 4 (four) times daily. 30 mL 0  . MAGNESIUM PO Take by mouth daily.    . Omega-3 Fatty Acids (OMEGA 3 PO) Take by mouth daily.    [name] MISC Dx E11.9. Inject into skin every morning. 100 each 4  . TURMERIC PO Take by mouth.     No facility-administered medications prior to visit.    Allergies  Allergen Reactions  . Metformin And Related Other (See Comments)    Joint pain and constipation  . Moxifloxacin Hcl In Nacl Hives  . Penicillins Hives    REACTION: unknown rx  . Quinolones Hives    In particular Avelox     ROS Review of Systems    Objective:    Physical Exam  Constitutional: She is oriented to person, place, and time. She appears well-developed and well-nourished.  HENT:  Head: Normocephalic and atraumatic.  Cardiovascular: Normal rate, regular rhythm and normal heart sounds.  Pulmonary/Chest: Effort normal and breath sounds normal.  Neurological: She is alert and oriented to person, place, and time.  Skin: Skin is warm and dry.  Psychiatric: She has a normal mood and affect. Her behavior is normal.    BP 120/61   Pulse 73   Ht 5' 7"  (1.702 m)   Wt 182 lb (82.6 kg)   SpO2 99%   BMI 28.51 kg/m  Wt Readings from Last 3 Encounters:  05/08/19 182 lb (82.6 kg)  02/08/19 182 lb (82.6 kg)  10/10/18 177 lb (80.3 kg)     Health Maintenance Due  Topic Date Due  . OPHTHALMOLOGY EXAM  11/02/2018    There are no preventive care reminders to display for this patient.  Lab Results  Component Value Date   TSH 2.60 07/29/2015   Lab Results  Component Value Date   WBC 5.3 07/29/2015   HGB 14.0 07/29/2015   HCT 41.2 07/29/2015   MCV 90.4 07/29/2015   PLT 271 07/29/2015   Lab Results  Component Value Date   NA 140 09/20/2018   K 4.2 09/20/2018   CO2 26 09/20/2018   GLUCOSE 161 (H) 09/20/2018   BUN 13 09/20/2018   CREATININE 0.82 09/20/2018   BILITOT 0.7 09/20/2018   ALKPHOS 54 05/05/2016   AST 9 (L) 09/20/2018   ALT 14 09/20/2018   PROT 7.1 09/20/2018   ALBUMIN 4.3 05/05/2016   CALCIUM 9.7 09/20/2018   Lab Results  Component Value Date   CHOL 189 09/20/2018   Lab Results  Component Value Date   HDL 71 09/20/2018   Lab Results  Component Value Date   LDLCALC 97 09/20/2018   Lab Results  Component Value Date   TRIG 116 09/20/2018   Lab Results  Component Value Date   CHOLHDL 2.7 09/20/2018   Lab Results  Component Value Date   HGBA1C 7.2 (A) 05/08/2019      Assessment & Plan:   Problem List Items Addressed This Visit      Endocrine    Uncontrolled diabetes mellitus ([place])    Not well controlled. Still not  on medication.  Strongly encouraged her to consider it.  Again discussed that her A1c is technically in the uncontrolled range.  Just really encouraged her to be more consistent with her diet she does have a glucometer just encouraged her to really use that to help her better manage her blood sugars and get a sense for what is elevating her numbers and what is not.  Also discussed continuing to work on trying to get regular exercise and.  She reports that her eye exam is up-to-date we reached out to my doctor previously and have not received a most recent reports I will reach out again and send a fax today.      Microalbuminuria due to type 2 diabetes mellitus (HCC)    Slight decline to be on an ACE inhibitor but we will continue to monitor.  Again most important to get her A1c back under control and the microalbuminuria may resolve.       Other Visit Diagnoses    Controlled type 2 diabetes mellitus with complication, without long-term current use of insulin ([place])    -  Primary   Relevant Orders   POCT glycosylated hemoglobin (Hb A1C) (Completed)      No orders of the defined types were placed in this encounter.   Follow-up: Return in about 3 months (around 08/07/2019) for Diabetes follow-up.   Time spent 22 minutes in encounter and review records.   Beatrice Lecher, MD

## 2019-05-08 NOTE — Assessment & Plan Note (Signed)
Slight decline to be on an ACE inhibitor but we will continue to monitor.  Again most important to get her A1c back under control and the microalbuminuria may resolve.

## 2019-05-08 NOTE — Assessment & Plan Note (Signed)
Not well controlled. Still not on medication.  Strongly encouraged her to consider it.  Again discussed that her A1c is technically in the uncontrolled range.  Just really encouraged her to be more consistent with her diet she does have a glucometer just encouraged her to really use that to help her better manage her blood sugars and get a sense for what is elevating her numbers and what is not.  Also discussed continuing to work on trying to get regular exercise and.  She reports that her eye exam is up-to-date we reached out to my doctor previously and have not received a most recent reports I will reach out again and send a fax today.

## 2019-05-12 DIAGNOSIS — R69 Illness, unspecified: Secondary | ICD-10-CM | POA: Diagnosis not present

## 2019-05-17 ENCOUNTER — Encounter: Payer: Self-pay | Admitting: Family Medicine

## 2019-05-25 DIAGNOSIS — H52209 Unspecified astigmatism, unspecified eye: Secondary | ICD-10-CM | POA: Diagnosis not present

## 2019-05-25 DIAGNOSIS — H5203 Hypermetropia, bilateral: Secondary | ICD-10-CM | POA: Diagnosis not present

## 2019-05-25 DIAGNOSIS — H524 Presbyopia: Secondary | ICD-10-CM | POA: Diagnosis not present

## 2019-07-18 ENCOUNTER — Ambulatory Visit (INDEPENDENT_AMBULATORY_CARE_PROVIDER_SITE_OTHER): Payer: Medicare HMO | Admitting: Nurse Practitioner

## 2019-07-18 ENCOUNTER — Encounter: Payer: Self-pay | Admitting: Nurse Practitioner

## 2019-07-18 VITALS — BP 114/73 | HR 74 | Temp 98.0°F | Ht 67.0 in | Wt 179.4 lb

## 2019-07-18 DIAGNOSIS — R1030 Lower abdominal pain, unspecified: Secondary | ICD-10-CM | POA: Diagnosis not present

## 2019-07-18 NOTE — Patient Instructions (Addendum)
We will get lab work today and will notify you when the results come in. If we need to make any changes to the plan of care, we will let you know.   I recommend taking a laxative, such as Miralax, and stool softener, such as Colace, to help move your bowels. I would use this for at least 2 days or until your bowels are moving regularly and you feel like you have complete emptying. It is ok if your stools are soft, we want to try to get everything out.  If your symptoms worsen or do not improve, or if you develop new symptoms, please let us know or seek emergency care.   Abdominal Pain, Adult Pain in the abdomen (abdominal pain) can be caused by many things. Often, abdominal pain is not serious and it gets better with no treatment or by being treated at home. However, sometimes abdominal pain is serious. Your health care provider will ask questions about your medical history and do a physical exam to try to determine the cause of your abdominal pain. Follow these instructions at home:  Medicines  Take over-the-counter and prescription medicines only as told by your health care provider.  Do not take a laxative unless told by your health care provider. General instructions  Watch your condition for any changes.  Drink enough fluid to keep your urine pale yellow.  Keep all follow-up visits as told by your health care provider. This is important. Contact a health care provider if:  Your abdominal pain changes or gets worse.  You are not hungry or you lose weight without trying.  You are constipated or have diarrhea for more than 2-3 days.  You have pain when you urinate or have a bowel movement.  Your abdominal pain wakes you up at night.  Your pain gets worse with meals, after eating, or with certain foods.  You are vomiting and cannot keep anything down.  You have a fever.  You have blood in your urine. Get help right away if:  Your pain does not go away as soon as your  health care provider told you to expect.  You cannot stop vomiting.  Your pain is only in areas of the abdomen, such as the right side or the left lower portion of the abdomen. Pain on the right side could be caused by appendicitis.  You have bloody or black stools, or stools that look like tar.  You have severe pain, cramping, or bloating in your abdomen.  You have signs of dehydration, such as: ? Dark urine, very little urine, or no urine. ? Cracked lips. ? Dry mouth. ? Sunken eyes. ? Sleepiness. ? Weakness.  You have trouble breathing or chest pain. Summary  Often, abdominal pain is not serious and it gets better with no treatment or by being treated at home. However, sometimes abdominal pain is serious.  Watch your condition for any changes.  Take over-the-counter and prescription medicines only as told by your health care provider.  Contact a health care provider if your abdominal pain changes or gets worse.  Get help right away if you have severe pain, cramping, or bloating in your abdomen. This information is not intended to replace advice given to you by your health care provider. Make sure you discuss any questions you have with your health care provider. Document Revised: 05/07/2018 Document Reviewed: 05/07/2018 Elsevier Patient Education  [place].

## 2019-07-18 NOTE — Progress Notes (Signed)
Acute Office Visit  Subjective:    Patient ID: Janet Mccullough, female    DOB: 1950/03/15, 69 y.o.   MRN: [id number]  Chief Complaint  Patient presents with  . Abdominal Pain    onset:2 wks, started as constipation and then after elimination turned into diarrhea, lower abdominal pain and cramping, persistent, has been taking a digestive enzyme with minimal relief, tried taking a stool softener over the weekend, cramping has improved but still feels like it is difficult to go to the bathroom    HPI Patient is in today for evaluation for bilateral lower abdominal pain that started approximately 2 weeks ago.  She reports initially she was experiencing symptoms of constipation and increased gas with abdominal cramping.  She reports eventually her constipation was relieved and she was passing soft stool however the increased gas and cramping in the lower abdomen continued on and off.  She reports that she started taking digestive enzymes which have helped with the pain/cramping and an over-the-counter stool softener which is also helped with the cramping however she has not had an increase in bowel movements.  She reports feelings of incomplete emptying of her bowels and mild intermittent nausea with no vomiting.  She also reports the presence of mucus in her stools and fatigue.  She reports she has had problems with intermittent constipation and abdominal cramping on and off for the last several years.  She does state when she was on Metformin she experienced significant abdominal cramping every 2 to 3 days but this has become less frequent since she stopped taking the medication.  She denies blood in her stool, fever, increased urination, malodorous urine, dark urine, back pain, or vomiting.  She has no history of irritable bowel syndrome or other gastrointestinal conditions.  She has never had a colonoscopy but she has had the Cologuard test performed which has always been negative.  She is due for  Cologuard again.  She has no family history of colon cancer.  Past Medical History:  Diagnosis Date  . Cancer (HCC)    breast  . Impaired fasting glucose   . Microscopic hematuria    renal ultrasound normal (Dr Diona Fanti)  . Normal echocardiogram    EF 60-65%    Past Surgical History:  Procedure Laterality Date  . ABDOMINAL HYSTERECTOMY  7/03   for DUB/ cysts  . masectomy    . MASTECTOMY Left   . uterine cyst removed  5/99    Family History  Problem Relation Age of Onset  . Diabetes Father   . Heart disease Father   . Diabetes Brother   . Diabetes Brother        type 2    Social History   Socioeconomic History  . Marital status: Widowed    Spouse name: Not on file  . Number of children: Not on file  . Years of education: Not on file  . Highest education level: Not on file  Occupational History  . Not on file  Tobacco Use  . Smoking status: Never Smoker  . Smokeless tobacco: Never Used  Substance and Sexual Activity  . Alcohol use: No  . Drug use: No  . Sexual activity: Not on file  Other Topics Concern  . Not on file  Social History Narrative  . Not on file   Social Determinants of Health   Financial Resource Strain:   . Difficulty of Paying Living Expenses:   Food Insecurity:   . Worried About [employer] of  Food in the Last Year:   . [place] in the Last Year:   Transportation Needs:   . Film/video editor (Medical):   Marland Kitchen Lack of Transportation (Non-Medical):   Physical Activity:   . Days of Exercise per Week:   . Minutes of Exercise per Session:   Stress:   . Feeling of Stress :   Social Connections:   . Frequency of Communication with Friends and Family:   . Frequency of Social Gatherings with Friends and Family:   . Attends Religious Services:   . Active Member of Clubs or Organizations:   . Attends Archivist Meetings:   Marland Kitchen Marital Status:   Intimate Partner Violence:   . Fear of Current or Ex-Partner:   .  Emotionally Abused:   Marland Kitchen Physically Abused:   . Sexually Abused:     Outpatient Medications Prior to Visit  Medication Sig Dispense Refill  . Ascorbic Acid (SUNKIST VITAMIN C PO) Take 500 mg by mouth daily.      Jolyne Loa Grape-Goldenseal (BERBERINE COMPLEX PO) Take by mouth.    . Blood Glucose Monitoring Suppl (ONE TOUCH ULTRA SYSTEM KIT) W/DEVICE KIT 1 kit by Does not apply route once. Lancets, test strips for testing 3 times a week. Dx: IFG Dx code: R73.01 1 each 0  . cetirizine (ZYRTEC) 10 MG tablet Take 10 mg by mouth daily.      . cholecalciferol (VITAMIN D) 1000 UNITS tablet Take 1,000 Units by mouth daily.    Marland Kitchen glucose blood (ONE TOUCH ULTRA TEST) test strip Dx: E11.9. Check fasting blood sugar daily. 100 each 11  . ipratropium (ATROVENT) 0.03 % nasal spray Place 2 sprays into both nostrils 4 (four) times daily. 30 mL 0  . MAGNESIUM PO Take by mouth daily.    [name] Lancets 62X MISC Dx E11.9. Inject into skin every morning. 100 each 4  . TURMERIC PO Take by mouth.    . Omega-3 Fatty Acids (OMEGA 3 PO) Take by mouth daily. (Patient not taking: Reported on 07/18/2019)     No facility-administered medications prior to visit.    Allergies  Allergen Reactions  . Metformin And Related Other (See Comments)    Joint pain and constipation  . Moxifloxacin Hcl In Nacl Hives  . Penicillins Hives    REACTION: unknown rx  . Quinolones Hives    In particular Avelox      Objective:    Physical Exam Vitals and nursing note reviewed.  Constitutional:      General: She is not in acute distress.    Appearance: She is well-developed and normal weight. She is not ill-appearing.  Eyes:     Extraocular Movements: Extraocular movements intact.  Cardiovascular:     Rate and Rhythm: Normal rate and regular rhythm.     Heart sounds: Normal heart sounds.  Pulmonary:     Effort: Pulmonary effort is normal.     Breath sounds: Normal breath sounds.  Abdominal:     General:  Bowel sounds are normal. There is distension. There is no abdominal bruit.     Palpations: Abdomen is soft. There is no shifting dullness, fluid wave, hepatomegaly, splenomegaly, mass or pulsatile mass.     Tenderness: There is no abdominal tenderness. There is no right CVA tenderness, left CVA tenderness, guarding or rebound. Negative signs include Murphy's sign, Rovsing's sign and McBurney's sign.     Hernia: No hernia is present.  Skin:  General: Skin is warm and dry.     Capillary Refill: Capillary refill takes less than 2 seconds.  Neurological:     General: No focal deficit present.     Mental Status: She is alert and oriented to person, place, and time.     Motor: No weakness.  Psychiatric:        Mood and Affect: Mood normal.        Behavior: Behavior normal.     BP 114/73   Pulse 74   Temp 98 F (36.7 C) (Oral)   Ht 5' 7"  (1.702 m)   Wt 179 lb 6.4 oz (81.4 kg)   SpO2 98%   BMI 28.10 kg/m  Wt Readings from Last 3 Encounters:  07/18/19 179 lb 6.4 oz (81.4 kg)  05/08/19 182 lb (82.6 kg)  02/08/19 182 lb (82.6 kg)    Health Maintenance Due  Topic Date Due  . FOOT EXAM  07/10/2019    There are no preventive care reminders to display for this patient.   Lab Results  Component Value Date   TSH 2.60 07/29/2015   Lab Results  Component Value Date   WBC 5.3 07/29/2015   HGB 14.0 07/29/2015   HCT 41.2 07/29/2015   MCV 90.4 07/29/2015   PLT 271 07/29/2015   Lab Results  Component Value Date   NA 140 09/20/2018   K 4.2 09/20/2018   CO2 26 09/20/2018   GLUCOSE 161 (H) 09/20/2018   BUN 13 09/20/2018   CREATININE 0.82 09/20/2018   BILITOT 0.7 09/20/2018   ALKPHOS 54 05/05/2016   AST 9 (L) 09/20/2018   ALT 14 09/20/2018   PROT 7.1 09/20/2018   ALBUMIN 4.3 05/05/2016   CALCIUM 9.7 09/20/2018   Lab Results  Component Value Date   CHOL 189 09/20/2018   Lab Results  Component Value Date   HDL 71 09/20/2018   Lab Results  Component Value Date    LDLCALC 97 09/20/2018   Lab Results  Component Value Date   TRIG 116 09/20/2018   Lab Results  Component Value Date   CHOLHDL 2.7 09/20/2018   Lab Results  Component Value Date   HGBA1C 7.2 (A) 05/08/2019       Assessment & Plan:   1. Lower abdominal pain Presence of lower abdominal pain with feelings of incomplete emptying of stool with increased gas, mucus in stool, and intermittent constipation.  Her current symptoms have been ongoing for approximately 2 weeks however she does report similar symptoms on and off for the past several years.  I'm highly suspicious of the possibility of irritable bowel syndrome.  We discussed the option of imaging today to determine if any irregularities could be visualized on x-ray or abdominal ultrasound.  At this time she wishes to try less aggressive monitoring.  We will obtain labs of a CBC and CMP today.  There is no evidence of gallbladder pain, epigastric pain, appendix pain or splenic pain on palpation.  We discussed the plan to treat for constipation at this time with over-the-counter laxative and stool softener treatments to see if this helps with resolution of her symptoms.  PLAN: - CBC with Differential - COMPLETE METABOLIC PANEL WITH GFR -Utilize over-the-counter MiraLAX and optional addition of Colace to facilitate bowel movements. -Continue treatment for at least 2 to 3 days or longer if needed to completely evacuate bowels -Treatment is expected to create soft/loose stools that are easy to pass. -I will monitor for lab results and let  you know if any changes need to be made to the plan of care based on the results -Please follow-up if symptoms worsen or fail to improve  Orma Render, NP

## 2019-07-19 LAB — COMPLETE METABOLIC PANEL WITH GFR
AG Ratio: 1.6 (calc) (ref 1.0–2.5)
ALT: 18 U/L (ref 6–29)
AST: 10 U/L (ref 10–35)
Albumin: 4.2 g/dL (ref 3.6–5.1)
Alkaline phosphatase (APISO): 71 U/L (ref 37–153)
BUN: 18 mg/dL (ref 7–25)
CO2: 30 mmol/L (ref 20–32)
Calcium: 9.7 mg/dL (ref 8.6–10.4)
Chloride: 103 mmol/L (ref 98–110)
Creat: 0.78 mg/dL (ref 0.50–0.99)
GFR, Est African American: 90 mL/min/{1.73_m2} (ref >=60)
GFR, Est Non African American: 78 mL/min/{1.73_m2} (ref >=60)
Globulin: 2.7 g/dL (calc) (ref 1.9–3.7)
Glucose, Bld: 166 mg/dL — ABNORMAL HIGH (ref 65–99)
Potassium: 4.5 mmol/L (ref 3.5–5.3)
Sodium: 140 mmol/L (ref 135–146)
Total Bilirubin: 0.5 mg/dL (ref 0.2–1.2)
Total Protein: 6.9 g/dL (ref 6.1–8.1)

## 2019-07-19 LAB — CBC WITH DIFFERENTIAL/PLATELET
Absolute Monocytes: 428 cells/uL (ref 200–950)
Basophils Absolute: 50 cells/uL (ref 0–200)
Basophils Relative: 0.8 %
Eosinophils Absolute: 120 cells/uL (ref 15–500)
Eosinophils Relative: 1.9 %
HCT: 38.1 % (ref 35.0–45.0)
Hemoglobin: 13.4 g/dL (ref 11.7–15.5)
Lymphs Abs: 1903 cells/uL (ref 850–3900)
MCH: 31.5 pg (ref 27.0–33.0)
MCHC: 35.2 g/dL (ref 32.0–36.0)
MCV: 89.6 fL (ref 80.0–100.0)
MPV: 11.4 fL (ref 7.5–12.5)
Monocytes Relative: 6.8 %
Neutro Abs: 3799 cells/uL (ref 1500–7800)
Neutrophils Relative %: 60.3 %
Platelets: 320 10*3/uL (ref 140–400)
RBC: 4.25 10*6/uL (ref 3.80–5.10)
RDW: 12.4 % (ref 11.0–15.0)
Total Lymphocyte: 30.2 %
WBC: 6.3 10*3/uL (ref 3.8–10.8)

## 2019-08-07 ENCOUNTER — Ambulatory Visit (INDEPENDENT_AMBULATORY_CARE_PROVIDER_SITE_OTHER): Payer: Medicare HMO | Admitting: Family Medicine

## 2019-08-07 ENCOUNTER — Other Ambulatory Visit: Payer: Self-pay

## 2019-08-07 ENCOUNTER — Encounter: Payer: Self-pay | Admitting: Family Medicine

## 2019-08-07 VITALS — BP 122/64 | HR 72 | Ht 67.0 in | Wt 180.0 lb

## 2019-08-07 DIAGNOSIS — R809 Proteinuria, unspecified: Secondary | ICD-10-CM | POA: Diagnosis not present

## 2019-08-07 DIAGNOSIS — E1129 Type 2 diabetes mellitus with other diabetic kidney complication: Secondary | ICD-10-CM | POA: Diagnosis not present

## 2019-08-07 DIAGNOSIS — E1165 Type 2 diabetes mellitus with hyperglycemia: Secondary | ICD-10-CM | POA: Diagnosis not present

## 2019-08-07 DIAGNOSIS — Z1211 Encounter for screening for malignant neoplasm of colon: Secondary | ICD-10-CM | POA: Diagnosis not present

## 2019-08-07 LAB — POCT GLYCOSYLATED HEMOGLOBIN (HGB A1C): Hemoglobin A1C: 7.4 % — AB (ref 4.0–5.6)

## 2019-08-07 NOTE — Progress Notes (Signed)
Established Patient Office Visit  Subjective:  Patient ID: Janet Mccullough, female    DOB: 01-12-1950  Age: 69 y.o. MRN: [id number]  CC:  Chief Complaint  Patient presents with  . Diabetes    HPI Janet Mccullough presents for   Diabetes - no hypoglycemic events. No wounds or sores that are not healing well. No increased thirst or urination. Checking glucose at home. . Last a!C was elevated at 7.2 in April.  She reports that she has not been exercising regularly.  Not currently on prescription medication.  He is taking Barberry and turmeric.  She has been checking her blood sugars a few times a week.  He feels like overall they have looked good she has been trying to track which foods are increasing her blood sugars.  She recently had labs including a CMP and CBC in July.  Last hemoglobin A1c was in April.   Past Medical History:  Diagnosis Date  . Cancer (HCC)    breast  . Impaired fasting glucose   . Microscopic hematuria    renal ultrasound normal (Dr Diona Fanti)  . Normal echocardiogram    EF 60-65%    Past Surgical History:  Procedure Laterality Date  . ABDOMINAL HYSTERECTOMY  7/03   for DUB/ cysts  . masectomy    . MASTECTOMY Left   . uterine cyst removed  5/99    Family History  Problem Relation Age of Onset  . Diabetes Father   . Heart disease Father   . Diabetes Brother   . Diabetes Brother        type 2    Social History   Socioeconomic History  . Marital status: Widowed    Spouse name: Not on file  . Number of children: Not on file  . Years of education: Not on file  . Highest education level: Not on file  Occupational History  . Not on file  Tobacco Use  . Smoking status: Never Smoker  . Smokeless tobacco: Never Used  Substance and Sexual Activity  . Alcohol use: No  . Drug use: No  . Sexual activity: Not on file  Other Topics Concern  . Not on file  Social History Narrative  . Not on file   Social Determinants of Health   Financial  Resource Strain:   . Difficulty of Paying Living Expenses:   Food Insecurity:   . Worried About Charity fundraiser in the Last Year:   . Janet Mccullough in the Last Year:   Transportation Needs:   . Janet editor (Medical):   Janet Mccullough Kitchen Janet of Transportation (Non-Medical):   Physical Activity:   . Days of Exercise per Week:   . Minutes of Exercise per Session:   Stress:   . Feeling of Stress :   Social Connections:   . Frequency of Communication with Friends and Family:   . Frequency of Social Gatherings with Friends and Family:   . Attends Religious Services:   . Active Member of Clubs or Organizations:   . Attends Archivist Meetings:   Janet Mccullough Kitchen Marital Status:   Intimate Partner Violence:   . Fear of Current or Ex-Partner:   . Emotionally Abused:   Janet Mccullough Kitchen Physically Abused:   . Sexually Abused:     Outpatient Medications Prior to Visit  Medication Sig Dispense Refill  . Ascorbic Acid (SUNKIST VITAMIN C PO) Take 500 mg by mouth daily.      Janet Mccullough (BERBERINE COMPLEX  PO) Take by mouth.    . Blood Glucose Monitoring Suppl (ONE TOUCH ULTRA SYSTEM KIT) W/DEVICE KIT 1 kit by Does not apply route once. Mccullough, test strips for testing 3 times a week. Dx: IFG Dx code: R73.01 1 each 0  . cetirizine (ZYRTEC) 10 MG tablet Take 10 mg by mouth daily.      . cholecalciferol (VITAMIN D) 1000 UNITS tablet Take 1,000 Units by mouth daily.    Janet Mccullough Kitchen glucose blood (ONE TOUCH ULTRA TEST) test strip Dx: E11.9. Check fasting blood sugar daily. 100 each 11  . ipratropium (ATROVENT) 0.03 % nasal spray Place 2 sprays into both nostrils 4 (four) times daily. 30 mL 0  . MAGNESIUM PO Take by mouth daily.    Janet Mccullough 16W MISC Dx E11.9. Inject into skin every morning. 100 each 4  . TURMERIC PO Take by mouth.     No facility-administered medications prior to visit.    Allergies  Allergen Reactions  . Metformin And Related Other (See Comments)    Joint pain and  constipation  . Moxifloxacin Hcl In Nacl Hives  . Penicillins Hives    REACTION: unknown rx  . Quinolones Hives    In particular Avelox    ROS Review of Systems    Objective:    Physical Exam Constitutional:      Appearance: She is well-developed.  HENT:     Head: Normocephalic and atraumatic.  Cardiovascular:     Rate and Rhythm: Normal rate and regular rhythm.     Heart sounds: Normal heart sounds.  Pulmonary:     Effort: Pulmonary effort is normal.     Breath sounds: Normal breath sounds.  Skin:    General: Skin is warm and dry.  Neurological:     Mental Status: She is alert and oriented to person, place, and time.  Psychiatric:        Behavior: Behavior normal.     BP (!) 122/64   Pulse 72   Ht 5' 7"  (1.702 m)   Wt 180 lb (81.6 kg)   SpO2 99%   BMI 28.19 kg/m  Wt Readings from Last 3 Encounters:  08/07/19 180 lb (81.6 kg)  07/18/19 179 lb 6.4 oz (81.4 kg)  05/08/19 182 lb (82.6 kg)     There are no preventive care reminders to display for this patient.  There are no preventive care reminders to display for this patient.  Lab Results  Component Value Date   TSH 2.60 07/29/2015   Lab Results  Component Value Date   WBC 6.3 07/18/2019   HGB 13.4 07/18/2019   HCT 38.1 07/18/2019   MCV 89.6 07/18/2019   PLT 320 07/18/2019   Lab Results  Component Value Date   NA 140 07/18/2019   K 4.5 07/18/2019   CO2 30 07/18/2019   GLUCOSE 166 (H) 07/18/2019   BUN 18 07/18/2019   CREATININE 0.78 07/18/2019   BILITOT 0.5 07/18/2019   ALKPHOS 54 05/05/2016   AST 10 07/18/2019   ALT 18 07/18/2019   PROT 6.9 07/18/2019   ALBUMIN 4.3 05/05/2016   CALCIUM 9.7 07/18/2019   Lab Results  Component Value Date   CHOL 189 09/20/2018   Lab Results  Component Value Date   HDL 71 09/20/2018   Lab Results  Component Value Date   LDLCALC 97 09/20/2018   Lab Results  Component Value Date   TRIG 116 09/20/2018   Lab Results  Component Value Date  CHOLHDL 2.7 09/20/2018   Lab Results  Component Value Date   HGBA1C 7.4 (A) 08/07/2019      Assessment & Plan:   Problem List Items Addressed This Visit      Endocrine   Uncontrolled diabetes mellitus ([place]) - Primary    Hemoglobin A1c elevated even more today at 7.4 from 7.2.  Uncontrolled.  Discussed starting prescription medication to better control her blood sugars.  We discussed the risks of not treating it and having uncontrolled diabetes including kidney failure, eye problems and cardiovascular disease.  Patient declines prescription medication and says she will continue to work at it she wants to really start exercising again which she had not been doing because she had been sick.  And says that she like to follow back up in 3 months.  Also encouraged her to restart her cinnamon capsules.  She will be due for lipids and urine microalbumin when I see her back.      Relevant Orders   POCT glycosylated hemoglobin (Hb A1C) (Completed)   Microalbuminuria due to type 2 diabetes mellitus ([place])    Other Visit Diagnoses    Colon cancer screening       Relevant Orders   Cologuard     Declines Covid vaccination.  Colon cancer screening-Cologuard order placed.  Will fax order.  No orders of the defined types were placed in this encounter.   Follow-up: Return in about 3 months (around 11/07/2019) for Diabetes follow-up.    Beatrice Lecher, MD

## 2019-08-07 NOTE — Assessment & Plan Note (Addendum)
Hemoglobin A1c elevated even more today at 7.4 from 7.2.  Uncontrolled.  Discussed starting prescription medication to better control her blood sugars.  We discussed the risks of not treating it and having uncontrolled diabetes including kidney failure, eye problems and cardiovascular disease.  Patient declines prescription medication and says she will continue to work at it she wants to really start exercising again which she had not been doing because she had been sick.  And says that she like to follow back up in 3 months.  Also encouraged her to restart her cinnamon capsules.  She will be due for lipids and urine microalbumin when I see her back.

## 2019-08-08 DIAGNOSIS — R69 Illness, unspecified: Secondary | ICD-10-CM | POA: Diagnosis not present

## 2019-08-12 DIAGNOSIS — R69 Illness, unspecified: Secondary | ICD-10-CM | POA: Diagnosis not present

## 2019-09-05 DIAGNOSIS — Z1211 Encounter for screening for malignant neoplasm of colon: Secondary | ICD-10-CM | POA: Diagnosis not present

## 2019-09-12 LAB — COLOGUARD
COLOGUARD: NEGATIVE
Cologuard: NEGATIVE

## 2019-09-17 ENCOUNTER — Telehealth: Payer: Self-pay | Admitting: Family Medicine

## 2019-09-17 NOTE — Telephone Encounter (Signed)
Call patient: Please let her know that her Cologuard results were negative which is fantastic.  Repeat colon cancer screening in 3 years.  At that time we can either repeat the same test or do full colonoscopy.

## 2019-09-17 NOTE — Telephone Encounter (Signed)
Left msg on mobile phone of normal results

## 2019-11-01 ENCOUNTER — Other Ambulatory Visit: Payer: Self-pay | Admitting: Family Medicine

## 2019-11-01 DIAGNOSIS — Z1231 Encounter for screening mammogram for malignant neoplasm of breast: Secondary | ICD-10-CM

## 2019-11-04 DIAGNOSIS — R69 Illness, unspecified: Secondary | ICD-10-CM | POA: Diagnosis not present

## 2019-11-07 ENCOUNTER — Encounter: Payer: Self-pay | Admitting: Family Medicine

## 2019-11-07 ENCOUNTER — Ambulatory Visit (INDEPENDENT_AMBULATORY_CARE_PROVIDER_SITE_OTHER): Payer: Medicare HMO | Admitting: Family Medicine

## 2019-11-07 ENCOUNTER — Other Ambulatory Visit: Payer: Self-pay

## 2019-11-07 VITALS — BP 127/63 | HR 83 | Ht 67.0 in | Wt 180.0 lb

## 2019-11-07 DIAGNOSIS — E1165 Type 2 diabetes mellitus with hyperglycemia: Secondary | ICD-10-CM

## 2019-11-07 DIAGNOSIS — R809 Proteinuria, unspecified: Secondary | ICD-10-CM | POA: Diagnosis not present

## 2019-11-07 DIAGNOSIS — E1129 Type 2 diabetes mellitus with other diabetic kidney complication: Secondary | ICD-10-CM | POA: Diagnosis not present

## 2019-11-07 LAB — POCT GLYCOSYLATED HEMOGLOBIN (HGB A1C): Hemoglobin A1C: 7.3 % — AB (ref 4.0–5.6)

## 2019-11-07 LAB — POCT UA - MICROALBUMIN
Albumin/Creatinine Ratio, Urine, POC: <30
Creatinine, POC: 300 mg/dL
Microalbumin Ur, POC: 30 mg/L

## 2019-11-07 NOTE — Assessment & Plan Note (Signed)
She really wants to see how she does with the NP thyroid and if that helps with her energy she is already noticed some improvement in it that then in turn helps with her blood glucose levels she has been monitoring them and says that they are about the same as they were.  But she is only been on it for about a week and a half.  We discussed again that having an A1c of 7 or higher means that her diabetes is uncontrolled and I still want her to consider prescription medication.  Follow-up in 3 months.  Continue to work on staying active.  Due for lipids

## 2019-11-07 NOTE — Assessment & Plan Note (Signed)
No evidence of protein in the urine today which is fantastic.  Again I want a get that A1c down under 7 to help reduce any protein loss that could be occurring and to reduce long-term organ damage.

## 2019-11-07 NOTE — Progress Notes (Signed)
Established Patient Office Visit  Subjective:  Patient ID: Janet Mccullough, female    DOB: 1950/08/24  Age: 69 y.o. MRN: [id number]  CC:  Chief Complaint  Patient presents with  . Diabetes    HPI Janet Mccullough presents for   Diabetes - no hypoglycemic events. No wounds or sores that are not healing well. No increased thirst or urination. Checking glucose at home.   She did see her integrative medicine specialist and was told that her thyroid levels were off so they have started her on NP thyroid 30 mg.  She has noticed some improvement in her energy level since taking it as that is been one of her biggest struggles with fatigue.  She has been on it for about a week and a half so far she has been monitoring her glucose levels as well and feels like they have been fairly stable.  He is also considering starting intermittent fasting she is done in the past and did well with it.  Past Medical History:  Diagnosis Date  . Cancer (HCC)    breast  . Impaired fasting glucose   . Microscopic hematuria    renal ultrasound normal (Dr Diona Fanti)  . Normal echocardiogram    EF 60-65%    Past Surgical History:  Procedure Laterality Date  . ABDOMINAL HYSTERECTOMY  7/03   for DUB/ cysts  . masectomy    . MASTECTOMY Left   . uterine cyst removed  5/99    Family History  Problem Relation Age of Onset  . Diabetes Father   . Heart disease Father   . Diabetes Brother   . Diabetes Brother        type 2    Social History   Socioeconomic History  . Marital status: Widowed    Spouse name: Not on file  . Number of children: Not on file  . Years of education: Not on file  . Highest education level: Not on file  Occupational History  . Not on file  Tobacco Use  . Smoking status: Never Smoker  . Smokeless tobacco: Never Used  Substance and Sexual Activity  . Alcohol use: No  . Drug use: No  . Sexual activity: Not on file  Other Topics Concern  . Not on file  Social History  Narrative  . Not on file   Social Determinants of Health   Financial Resource Strain:   . Difficulty of Paying Living Expenses: Not on file  Food Insecurity:   . Worried About Charity fundraiser in the Last Year: Not on file  . Ran Out of Food in the Last Year: Not on file  Transportation Needs:   . Lack of Transportation (Medical): Not on file  . Lack of Transportation (Non-Medical): Not on file  Physical Activity:   . Days of Exercise per Week: Not on file  . Minutes of Exercise per Session: Not on file  Stress:   . Feeling of Stress : Not on file  Social Connections:   . Frequency of Communication with Friends and Family: Not on file  . Frequency of Social Gatherings with Friends and Family: Not on file  . Attends Religious Services: Not on file  . Active Member of Clubs or Organizations: Not on file  . Attends Archivist Meetings: Not on file  . Marital Status: Not on file  Intimate Partner Violence:   . Fear of Current or Ex-Partner: Not on file  . Emotionally Abused: Not on  file  . Physically Abused: Not on file  . Sexually Abused: Not on file    Outpatient Medications Prior to Visit  Medication Sig Dispense Refill  . Ascorbic Acid (SUNKIST VITAMIN C PO) Take 500 mg by mouth daily.      Jolyne Loa Grape-Goldenseal (BERBERINE COMPLEX PO) Take by mouth.    . Blood Glucose Monitoring Suppl (ONE TOUCH ULTRA SYSTEM KIT) W/DEVICE KIT 1 kit by Does not apply route once. Lancets, test strips for testing 3 times a week. Dx: IFG Dx code: R73.01 1 each 0  . cetirizine (ZYRTEC) 10 MG tablet Take 10 mg by mouth daily.      . cholecalciferol (VITAMIN D) 1000 UNITS tablet Take 1,000 Units by mouth daily.    Marland Kitchen glucose blood (ONE TOUCH ULTRA TEST) test strip Dx: E11.9. Check fasting blood sugar daily. 100 each 11  . ipratropium (ATROVENT) 0.03 % nasal spray Place 2 sprays into both nostrils 4 (four) times daily. 30 mL 0  . MAGNESIUM PO Take by mouth daily.    . NP  THYROID 30 MG tablet Take 30 mg by mouth daily.    [name] Lancets 19E MISC Dx E11.9. Inject into skin every morning. 100 each 4  . TURMERIC PO Take by mouth.     No facility-administered medications prior to visit.    Allergies  Allergen Reactions  . Metformin And Related Other (See Comments)    Joint pain and constipation  . Moxifloxacin Hcl In Nacl Hives  . Penicillins Hives    REACTION: unknown rx  . Quinolones Hives    In particular Avelox    ROS Review of Systems    Objective:    Physical Exam Constitutional:      Appearance: She is well-developed.  HENT:     Head: Normocephalic and atraumatic.  Cardiovascular:     Rate and Rhythm: Normal rate and regular rhythm.     Heart sounds: Normal heart sounds.  Pulmonary:     Effort: Pulmonary effort is normal.     Breath sounds: Normal breath sounds.  Musculoskeletal:     Cervical back: No rigidity or tenderness.  Skin:    General: Skin is warm and dry.  Neurological:     Mental Status: She is alert and oriented to person, place, and time.  Psychiatric:        Behavior: Behavior normal.     BP 127/63   Pulse 83   Ht _0  (1.702 m)   Wt 180 lb (81.6 kg)   SpO2 99%   BMI 28.19 kg/m  Wt Readings from Last 3 Encounters:  11/07/19 180 lb (81.6 kg)  08/07/19 180 lb (81.6 kg)  07/18/19 179 lb 6.4 oz (81.4 kg)     There are no preventive care reminders to display for this patient.  There are no preventive care reminders to display for this patient.  Lab Results  Component Value Date   TSH 2.60 07/29/2015   Lab Results  Component Value Date   WBC 6.3 07/18/2019   HGB 13.4 07/18/2019   HCT 38.1 07/18/2019   MCV 89.6 07/18/2019   PLT 320 07/18/2019   Lab Results  Component Value Date   NA 140 07/18/2019   K 4.5 07/18/2019   CO2 30 07/18/2019   GLUCOSE 166 (H) 07/18/2019   BUN 18 07/18/2019   CREATININE 0.78 07/18/2019   BILITOT 0.5 07/18/2019   ALKPHOS 54 05/05/2016   AST 10  07/18/2019  ALT 18 07/18/2019   PROT 6.9 07/18/2019   ALBUMIN 4.3 05/05/2016   CALCIUM 9.7 07/18/2019   Lab Results  Component Value Date   CHOL 189 09/20/2018   Lab Results  Component Value Date   HDL 71 09/20/2018   Lab Results  Component Value Date   LDLCALC 97 09/20/2018   Lab Results  Component Value Date   TRIG 116 09/20/2018   Lab Results  Component Value Date   CHOLHDL 2.7 09/20/2018   Lab Results  Component Value Date   HGBA1C 7.3 (A) 11/07/2019      Assessment & Plan:   Problem List Items Addressed This Visit      Endocrine   Uncontrolled diabetes mellitus ([place]) - Primary    She really wants to see how she does with the NP thyroid and if that helps with her energy she is already noticed some improvement in it that then in turn helps with her blood glucose levels she has been monitoring them and says that they are about the same as they were.  But she is only been on it for about a week and a half.  We discussed again that having an A1c of 7 or higher means that her diabetes is uncontrolled and I still want her to consider prescription medication.  Follow-up in 3 months.  Continue to work on staying active.  Due for lipids      Relevant Orders   POCT glycosylated hemoglobin (Hb A1C) (Completed)   POCT UA - Microalbumin (Completed)   Lipid Panel w/reflex Direct LDL   COMPLETE METABOLIC PANEL WITH GFR   Microalbuminuria due to type 2 diabetes mellitus (HCC)    No evidence of protein in the urine today which is fantastic.  Again I want a get that A1c down under 7 to help reduce any protein loss that could be occurring and to reduce long-term organ damage.         No orders of the defined types were placed in this encounter.   Follow-up: Return in about 3 months (around 02/07/2020) for Diabetes follow-up.    Beatrice Lecher, MD

## 2019-11-08 LAB — COMPLETE METABOLIC PANEL WITH GFR
AG Ratio: 1.7 (calc) (ref 1.0–2.5)
ALT: 11 U/L (ref 6–29)
AST: 9 U/L — ABNORMAL LOW (ref 10–35)
Albumin: 4.5 g/dL (ref 3.6–5.1)
Alkaline phosphatase (APISO): 62 U/L (ref 37–153)
BUN: 19 mg/dL (ref 7–25)
CO2: 26 mmol/L (ref 20–32)
Calcium: 9.5 mg/dL (ref 8.6–10.4)
Chloride: 103 mmol/L (ref 98–110)
Creat: 0.75 mg/dL (ref 0.50–0.99)
GFR, Est African American: 94 mL/min/{1.73_m2} (ref >=60)
GFR, Est Non African American: 81 mL/min/{1.73_m2} (ref >=60)
Globulin: 2.7 g/dL (calc) (ref 1.9–3.7)
Glucose, Bld: 169 mg/dL — ABNORMAL HIGH (ref 65–99)
Potassium: 4.3 mmol/L (ref 3.5–5.3)
Sodium: 139 mmol/L (ref 135–146)
Total Bilirubin: 0.7 mg/dL (ref 0.2–1.2)
Total Protein: 7.2 g/dL (ref 6.1–8.1)

## 2019-11-08 LAB — LIPID PANEL W/REFLEX DIRECT LDL
Cholesterol: 190 mg/dL (ref <200)
HDL: 64 mg/dL (ref >=50)
LDL Cholesterol (Calc): 107 mg/dL (calc) — ABNORMAL HIGH
Non-HDL Cholesterol (Calc): 126 mg/dL (calc) (ref <130)
Total CHOL/HDL Ratio: 3 (calc) (ref <5.0)
Triglycerides: 95 mg/dL (ref <150)

## 2019-11-14 ENCOUNTER — Other Ambulatory Visit: Payer: Self-pay

## 2019-11-14 ENCOUNTER — Ambulatory Visit (INDEPENDENT_AMBULATORY_CARE_PROVIDER_SITE_OTHER): Payer: Medicare HMO

## 2019-11-14 DIAGNOSIS — Z1231 Encounter for screening mammogram for malignant neoplasm of breast: Secondary | ICD-10-CM | POA: Diagnosis not present

## 2020-01-21 ENCOUNTER — Telehealth (INDEPENDENT_AMBULATORY_CARE_PROVIDER_SITE_OTHER): Payer: Medicare HMO | Admitting: Nurse Practitioner

## 2020-01-21 DIAGNOSIS — Z5329 Procedure and treatment not carried out because of patient's decision for other reasons: Secondary | ICD-10-CM

## 2020-01-21 DIAGNOSIS — Z91199 Patient's noncompliance with other medical treatment and regimen due to unspecified reason: Secondary | ICD-10-CM

## 2020-01-21 NOTE — Progress Notes (Signed)
MyChart visit scheduled for 0820.  No answer and unable to reach patient on cell phone or home phone listed. 3 calls to cell phone, 2 calls to home phone.  VM left on both numbers with recommendation for COVID testing based on symptoms listed in appointment notes with recommended resources to locate testing sites in the area. Recommendations for patient to also call the office to reschedule appointment if she wishes.

## 2020-01-24 ENCOUNTER — Telehealth: Payer: Self-pay

## 2020-01-24 NOTE — Telephone Encounter (Signed)
Agree with documentation as above.   Saharra Santo, MD  

## 2020-01-24 NOTE — Telephone Encounter (Signed)
Janet Mccullough states she is positive for COVID. Patient advised to drink plenty of fluids and get plenty of rest   Also advised.   People with COVID-19 have had a wide range of symptoms reported - ranging from mild symptoms to severe illness. Symptoms may appear 2-14 days after exposure to the virus. People with these symptoms may have COVID-19: . Fever or chills . Cough . Shortness of breath or difficulty breathing . Fatigue . Muscle or body aches . Headache . New loss of taste or smell . Sore throat . Congestion or runny nose . Nausea or vomiting . Diarrhea .  When to Seek Emergency Medical Attention Look for emergency warning signs* for COVID-19. If someone is showing any of these signs, seek emergency medical care immediately . Trouble breathing . Persistent pain or pressure in the chest . New confusion . Inability to wake or stay awake . Bluish lips or face  How to self-isolate  . Use a separate room and bathroom for sick household members (if possible). Wendee Copp your hands often with soap and water for at least 20 seconds, especially after blowing your nose, coughing, or sneezing; going to the bathroom; and before eating or preparing food. . If soap and water are not readily available, use an alcohol-based hand sanitizer with at least 60% alcohol. Always wash hands with soap and water if hands are visibly dirty. . Provide your sick household member with clean disposable facemasks to wear at home, if available, to help prevent spreading COVID-19 to others. . Clean the sick room and bathroom, as needed, to avoid unnecessary contact with the sick person. Marland Kitchen Avoid sharing personal items like utensils, food, and drinks.

## 2020-02-10 ENCOUNTER — Ambulatory Visit: Payer: Medicare HMO | Admitting: Family Medicine

## 2020-02-10 NOTE — Progress Notes (Deleted)
Established Patient Office Visit  Subjective:  Patient ID: Janet Mccullough, female    DOB: Mar 23, 1950  Age: 70 y.o. MRN: [id number]  CC: No chief complaint on file.   HPI Janet Mccullough presents for   Diabetes - no hypoglycemic events. No wounds or sores that are not healing well. No increased thirst or urination. Checking glucose at home. Taking medications as prescribed without any side effects.   Past Medical History:  Diagnosis Date  . Cancer (HCC)    breast  . Impaired fasting glucose   . Microscopic hematuria    renal ultrasound normal (Dr Diona Fanti)  . Normal echocardiogram    EF 60-65%    Past Surgical History:  Procedure Laterality Date  . ABDOMINAL HYSTERECTOMY  7/03   for DUB/ cysts  . masectomy    . MASTECTOMY Left   . uterine cyst removed  5/99    Family History  Problem Relation Age of Onset  . Diabetes Father   . Heart disease Father   . Diabetes Brother   . Diabetes Brother        type 2    Social History   Socioeconomic History  . Marital status: Widowed    Spouse name: Not on file  . Number of children: Not on file  . Years of education: Not on file  . Highest education level: Not on file  Occupational History  . Not on file  Tobacco Use  . Smoking status: Never Smoker  . Smokeless tobacco: Never Used  Substance and Sexual Activity  . Alcohol use: No  . Drug use: No  . Sexual activity: Not on file  Other Topics Concern  . Not on file  Social History Narrative  . Not on file   Social Determinants of Health   Financial Resource Strain: Not on file  Food Insecurity: Not on file  Transportation Needs: Not on file  Physical Activity: Not on file  Stress: Not on file  Social Connections: Not on file  Intimate Partner Violence: Not on file    Outpatient Medications Prior to Visit  Medication Sig Dispense Refill  . Ascorbic Acid (SUNKIST VITAMIN C PO) Take 500 mg by mouth daily.      Jolyne Loa Grape-Goldenseal (BERBERINE  COMPLEX PO) Take by mouth.    . Blood Glucose Monitoring Suppl (ONE TOUCH ULTRA SYSTEM KIT) W/DEVICE KIT 1 kit by Does not apply route once. Lancets, test strips for testing 3 times a week. Dx: IFG Dx code: R73.01 1 each 0  . cetirizine (ZYRTEC) 10 MG tablet Take 10 mg by mouth daily.      . cholecalciferol (VITAMIN D) 1000 UNITS tablet Take 1,000 Units by mouth daily.    Marland Kitchen glucose blood (ONE TOUCH ULTRA TEST) test strip Dx: E11.9. Check fasting blood sugar daily. 100 each 11  . ipratropium (ATROVENT) 0.03 % nasal spray Place 2 sprays into both nostrils 4 (four) times daily. 30 mL 0  . MAGNESIUM PO Take by mouth daily.    . NP THYROID 30 MG tablet Take 30 mg by mouth daily.    [name] Lancets 01U MISC Dx E11.9. Inject into skin every morning. 100 each 4  . TURMERIC PO Take by mouth.     No facility-administered medications prior to visit.    Allergies  Allergen Reactions  . Metformin And Related Other (See Comments)    Joint pain and constipation  . Moxifloxacin Hcl In Nacl Hives  . Penicillins Hives  REACTION: unknown rx  . Quinolones Hives    In particular Avelox    ROS Review of Systems    Objective:    Physical Exam  There were no vitals taken for this visit. Wt Readings from Last 3 Encounters:  11/07/19 180 lb (81.6 kg)  08/07/19 180 lb (81.6 kg)  07/18/19 179 lb 6.4 oz (81.4 kg)     Health Maintenance Due  Topic Date Due  . OPHTHALMOLOGY EXAM  01/21/2020    There are no preventive care reminders to display for this patient.  Lab Results  Component Value Date   TSH 2.60 07/29/2015   Lab Results  Component Value Date   WBC 6.3 07/18/2019   HGB 13.4 07/18/2019   HCT 38.1 07/18/2019   MCV 89.6 07/18/2019   PLT 320 07/18/2019   Lab Results  Component Value Date   NA 139 11/07/2019   K 4.3 11/07/2019   CO2 26 11/07/2019   GLUCOSE 169 (H) 11/07/2019   BUN 19 11/07/2019   CREATININE 0.75 11/07/2019   BILITOT 0.7 11/07/2019   ALKPHOS 54  05/05/2016   AST 9 (L) 11/07/2019   ALT 11 11/07/2019   PROT 7.2 11/07/2019   ALBUMIN 4.3 05/05/2016   CALCIUM 9.5 11/07/2019   Lab Results  Component Value Date   CHOL 190 11/07/2019   Lab Results  Component Value Date   HDL 64 11/07/2019   Lab Results  Component Value Date   LDLCALC 107 (H) 11/07/2019   Lab Results  Component Value Date   TRIG 95 11/07/2019   Lab Results  Component Value Date   CHOLHDL 3.0 11/07/2019   Lab Results  Component Value Date   HGBA1C 7.3 (A) 11/07/2019      Assessment & Plan:   Problem List Items Addressed This Visit      Endocrine   Uncontrolled diabetes mellitus ([place]) - Primary      No orders of the defined types were placed in this encounter.   Follow-up: No follow-ups on file.    Beatrice Lecher, MD

## 2020-02-20 ENCOUNTER — Encounter: Payer: Self-pay | Admitting: Family Medicine

## 2020-02-20 ENCOUNTER — Ambulatory Visit (INDEPENDENT_AMBULATORY_CARE_PROVIDER_SITE_OTHER): Payer: Medicare HMO | Admitting: Family Medicine

## 2020-02-20 ENCOUNTER — Other Ambulatory Visit: Payer: Self-pay

## 2020-02-20 VITALS — BP 123/56 | HR 77 | Ht 67.0 in | Wt 178.0 lb

## 2020-02-20 DIAGNOSIS — E1129 Type 2 diabetes mellitus with other diabetic kidney complication: Secondary | ICD-10-CM

## 2020-02-20 DIAGNOSIS — Z23 Encounter for immunization: Secondary | ICD-10-CM | POA: Diagnosis not present

## 2020-02-20 DIAGNOSIS — Z8616 Personal history of COVID-19: Secondary | ICD-10-CM | POA: Diagnosis not present

## 2020-02-20 DIAGNOSIS — R809 Proteinuria, unspecified: Secondary | ICD-10-CM

## 2020-02-20 DIAGNOSIS — E1165 Type 2 diabetes mellitus with hyperglycemia: Secondary | ICD-10-CM

## 2020-02-20 LAB — POCT GLYCOSYLATED HEMOGLOBIN (HGB A1C): Hemoglobin A1C: 7.9 % — AB (ref 4.0–5.6)

## 2020-02-20 LAB — POCT UA - MICROALBUMIN
Albumin/Creatinine Ratio, Urine, POC: <30
Creatinine, POC: 200 mg/dL
Microalbumin Ur, POC: 10 mg/L

## 2020-02-20 MED ORDER — TETANUS-DIPHTH-ACELL PERTUSSIS 5-2.5-18.5 LF-MCG/0.5 IM SUSP: 0.5000 mL | Freq: Once | INTRAMUSCULAR | 0 refills | Status: AC

## 2020-02-20 NOTE — Assessment & Plan Note (Signed)
Feeling much better.  She feels like she is almost completely recovered may be still a little bit tired.

## 2020-02-20 NOTE — Progress Notes (Signed)
Established Patient Office Visit  Subjective:  Patient ID: Janet Mccullough, female    DOB: [birth year]  Age: 70 y.o. MRN: [id number]  CC:  Chief Complaint  Patient presents with  . Diabetes    Pt has an appointment for her eye exam at the end of the month.  She believes that her A1C will be elevated due to having COVID last month and her home BS were elevated     HPI Janet Mccullough presents for   F/u Diabetes - no hypoglycemic events. No wounds or sores that are not healing well. No increased thirst or urination. Checking glucose at home. Taking medications as prescribed without any side effects.   She was recently dx with COVID. Mostly had sinus sxs but she is feeling much better.  She was unable to take a lot of her supplements that she normally takes to help control her blood sugars while she was sick so she was off of them for a couple of weeks.  Past Medical History:  Diagnosis Date  . Cancer (HCC)    breast  . Impaired fasting glucose   . Microscopic hematuria    renal ultrasound normal (Dr Diona Fanti)  . Normal echocardiogram    EF 60-65%    Past Surgical History:  Procedure Laterality Date  . ABDOMINAL HYSTERECTOMY  7/03   for DUB/ cysts  . masectomy    . MASTECTOMY Left   . uterine cyst removed  5/99    Family History  Problem Relation Age of Onset  . Diabetes Father   . Heart disease Father   . Diabetes Brother   . Diabetes Brother        type 2    Social History   Socioeconomic History  . Marital status: Widowed    Spouse name: Not on file  . Number of children: Not on file  . Years of education: Not on file  . Highest education level: Not on file  Occupational History  . Not on file  Tobacco Use  . Smoking status: Never Smoker  . Smokeless tobacco: Never Used  Substance and Sexual Activity  . Alcohol use: No  . Drug use: No  . Sexual activity: Not on file  Other Topics Concern  . Not on file  Social History Narrative  . Not on file    Social Determinants of Health   Financial Resource Strain: Not on file  Food Insecurity: Not on file  Transportation Needs: Not on file  Physical Activity: Not on file  Stress: Not on file  Social Connections: Not on file  Intimate Partner Violence: Not on file    Outpatient Medications Prior to Visit  Medication Sig Dispense Refill  . AMBULATORY NON FORMULARY MEDICATION Take 1 tablet by mouth daily. Medication Name:  Berberine supplement    . AMBULATORY NON FORMULARY MEDICATION Take 1 capsule by mouth in the morning and at bedtime. Medication Name: BONE BUILDING SUPPLEMENT    . Ascorbic Acid (SUNKIST VITAMIN C PO) Take 500 mg by mouth daily.    . Blood Glucose Monitoring Suppl (ONE TOUCH ULTRA SYSTEM KIT) W/DEVICE KIT 1 kit by Does not apply route once. Lancets, test strips for testing 3 times a week. Dx: IFG Dx code: R73.01 1 each 0  . cholecalciferol (VITAMIN D) 1000 UNITS tablet Take 1,000 Units by mouth daily.    Marland Kitchen glucose blood (ONE TOUCH ULTRA TEST) test strip Dx: E11.9. Check fasting blood sugar daily. 100 each 11  . MAGNESIUM  PO Take by mouth daily.    . NP THYROID 30 MG tablet Take 30 mg by mouth daily.    . Nutritional Supplements (DHEA) 15-50 MG CAPS Take 15 mg by mouth daily.    [name] Lancets 93T MISC Dx E11.9. Inject into skin every morning. 100 each 4  . TURMERIC PO Take by mouth.    Jolyne Loa Grape-Goldenseal (BERBERINE COMPLEX PO) Take by mouth.    Jolyne Loa Grape-Goldenseal (BERBERINE COMPLEX PO) Take by mouth.    . cetirizine (ZYRTEC) 10 MG tablet Take 10 mg by mouth daily.      Marland Kitchen ipratropium (ATROVENT) 0.03 % nasal spray Place 2 sprays into both nostrils 4 (four) times daily. 30 mL 0   No facility-administered medications prior to visit.    Allergies  Allergen Reactions  . Metformin And Related Other (See Comments)    Joint pain and constipation  . Moxifloxacin Hcl In Nacl Hives  . Penicillins Hives    REACTION: unknown rx  .  Quinolones Hives    In particular Avelox    ROS Review of Systems    Objective:    Physical Exam Constitutional:      Appearance: She is well-developed and well-nourished.  HENT:     Head: Normocephalic and atraumatic.  Cardiovascular:     Rate and Rhythm: Normal rate and regular rhythm.     Heart sounds: Normal heart sounds.  Pulmonary:     Effort: Pulmonary effort is normal.     Breath sounds: Normal breath sounds.  Skin:    General: Skin is warm and dry.  Neurological:     Mental Status: She is alert and oriented to person, place, and time.  Psychiatric:        Mood and Affect: Mood and affect normal.        Behavior: Behavior normal.     BP (!) 123/56   Pulse 77   Ht 5' 7"  (1.702 m)   Wt 178 lb (80.7 kg)   SpO2 99%   BMI 27.88 kg/m  Wt Readings from Last 3 Encounters:  02/20/20 178 lb (80.7 kg)  11/07/19 180 lb (81.6 kg)  08/07/19 180 lb (81.6 kg)     There are no preventive care reminders to display for this patient.  There are no preventive care reminders to display for this patient.  Lab Results  Component Value Date   TSH 2.60 07/29/2015   Lab Results  Component Value Date   WBC 6.3 07/18/2019   HGB 13.4 07/18/2019   HCT 38.1 07/18/2019   MCV 89.6 07/18/2019   PLT 320 07/18/2019   Lab Results  Component Value Date   NA 139 11/07/2019   K 4.3 11/07/2019   CO2 26 11/07/2019   GLUCOSE 169 (H) 11/07/2019   BUN 19 11/07/2019   CREATININE 0.75 11/07/2019   BILITOT 0.7 11/07/2019   ALKPHOS 54 05/05/2016   AST 9 (L) 11/07/2019   ALT 11 11/07/2019   PROT 7.2 11/07/2019   ALBUMIN 4.3 05/05/2016   CALCIUM 9.5 11/07/2019   Lab Results  Component Value Date   CHOL 190 11/07/2019   Lab Results  Component Value Date   HDL 64 11/07/2019   Lab Results  Component Value Date   LDLCALC 107 (H) 11/07/2019   Lab Results  Component Value Date   TRIG 95 11/07/2019   Lab Results  Component Value Date   CHOLHDL 3.0 11/07/2019   Lab  Results  Component Value  Date   HGBA1C 7.9 (A) 02/20/2020      Assessment & Plan:   Problem List Items Addressed This Visit      Endocrine   Uncontrolled diabetes mellitus ([place])    A1c still uncontrolled and continuing to trend upward for well over a year at this point.  Again had a discussion about starting medication and the benefits in reducing complications from diabetes.  She declines and wants to work on activity level.  Plan to follow-up in 3 months.  Microalbumin negative.      Relevant Orders   POCT glycosylated hemoglobin (Hb A1C) (Completed)   POCT UA - Microalbumin (Completed)   Microalbuminuria due to type 2 diabetes mellitus ([place]) - Primary   Relevant Orders   POCT glycosylated hemoglobin (Hb A1C) (Completed)   POCT UA - Microalbumin (Completed)     Other   History of COVID-19    Feeling much better.  She feels like she is almost completely recovered may be still a little bit tired.       Other Visit Diagnoses    Need for tetanus, diphtheria, and acellular pertussis (Tdap) vaccine in patient of adolescent age or older       Relevant Medications   Tdap ([place]) 5-2.5-18.5 LF-MCG/0.5 injection      Meds ordered this encounter  Medications  . Tdap (BOOSTRIX) 5-2.5-18.5 LF-MCG/0.5 injection    Sig: Inject 0.5 mLs into the muscle once for 1 dose.    Dispense:  0.5 mL    Refill:  0    Follow-up: Return in about 3 months (around 05/19/2020) for Diabetes follow-up.    Beatrice Lecher, MD

## 2020-02-20 NOTE — Assessment & Plan Note (Signed)
A1c still uncontrolled and continuing to trend upward for well over a year at this point.  Again had a discussion about starting medication and the benefits in reducing complications from diabetes.  She declines and wants to work on activity level.  Plan to follow-up in 3 months.  Microalbumin negative.

## 2020-03-09 DIAGNOSIS — Z0100 Encounter for examination of eyes and vision without abnormal findings: Secondary | ICD-10-CM | POA: Diagnosis not present

## 2020-03-09 DIAGNOSIS — H5200 Hypermetropia, unspecified eye: Secondary | ICD-10-CM | POA: Diagnosis not present

## 2020-03-09 DIAGNOSIS — H04123 Dry eye syndrome of bilateral lacrimal glands: Secondary | ICD-10-CM | POA: Diagnosis not present

## 2020-03-09 DIAGNOSIS — H2513 Age-related nuclear cataract, bilateral: Secondary | ICD-10-CM | POA: Diagnosis not present

## 2020-03-09 DIAGNOSIS — E119 Type 2 diabetes mellitus without complications: Secondary | ICD-10-CM | POA: Diagnosis not present

## 2020-03-09 DIAGNOSIS — Z135 Encounter for screening for eye and ear disorders: Secondary | ICD-10-CM | POA: Diagnosis not present

## 2020-03-09 LAB — HM DIABETES EYE EXAM

## 2020-04-17 ENCOUNTER — Other Ambulatory Visit: Payer: Self-pay | Admitting: Family Medicine

## 2020-04-17 DIAGNOSIS — E119 Type 2 diabetes mellitus without complications: Secondary | ICD-10-CM

## 2020-04-29 ENCOUNTER — Encounter: Payer: Self-pay | Admitting: Family Medicine

## 2020-05-19 ENCOUNTER — Ambulatory Visit (INDEPENDENT_AMBULATORY_CARE_PROVIDER_SITE_OTHER): Payer: Medicare HMO | Admitting: Family Medicine

## 2020-05-19 ENCOUNTER — Other Ambulatory Visit: Payer: Self-pay

## 2020-05-19 ENCOUNTER — Encounter: Payer: Self-pay | Admitting: Family Medicine

## 2020-05-19 VITALS — BP 133/71 | HR 76 | Ht 67.0 in | Wt 180.0 lb

## 2020-05-19 DIAGNOSIS — E1129 Type 2 diabetes mellitus with other diabetic kidney complication: Secondary | ICD-10-CM

## 2020-05-19 DIAGNOSIS — E1165 Type 2 diabetes mellitus with hyperglycemia: Secondary | ICD-10-CM | POA: Diagnosis not present

## 2020-05-19 DIAGNOSIS — R809 Proteinuria, unspecified: Secondary | ICD-10-CM

## 2020-05-19 LAB — POCT GLYCOSYLATED HEMOGLOBIN (HGB A1C): Hemoglobin A1C: 7.9 % — AB (ref 4.0–5.6)

## 2020-05-19 MED ORDER — METFORMIN HCL ER 500 MG PO TB24: 500.0000 mg | ORAL_TABLET | Freq: Every day | ORAL | 0 refills | Status: DC

## 2020-05-19 NOTE — Assessment & Plan Note (Signed)
A1C unchanged from prior.  Still 7.9 on her current regimen.  We discussed getting back on metformin.  Discussed pros and cons of potential side effects of medication.  We will start with metformin 5 mg ER.  New prescription sent to pharmacy.  I do want her to continue with her current regimen as well.  And then follow-up in 3 months we will consider added really love to see her increase her walking and start getting into a regular routine with that.

## 2020-05-19 NOTE — Progress Notes (Signed)
Her BS have been slowly coming down but not as quickly as she would like for them to. Pt reports that she is trying to become more active since having COVID.

## 2020-05-19 NOTE — Progress Notes (Signed)
Established Patient Office Visit  Subjective:  Patient ID: Janet Mccullough, female    DOB: [birth year]  Age: 70 y.o. MRN: [id number]  CC:  Chief Complaint  Patient presents with  . Diabetes    HPI Janet Mccullough presents for   Diabetes - no hypoglycemic events. No wounds or sores that are not healing well. No increased thirst or urination. Checking glucose at home. Taking medications as prescribed without any side effects.  She is still taking her supplements including turmeric, to the HPA etc. in hopes that would help.  She has not started walking for exercise yet after having COVID she says that she is taken her really long time to bounce back.  She is also taking some additional supplements since then she does feel like she could probably start walking again soon.   Past Medical History:  Diagnosis Date  . Cancer (HCC)    breast  . Impaired fasting glucose   . Microscopic hematuria    renal ultrasound normal (Dr Diona Fanti)  . Normal echocardiogram    EF 60-65%    Past Surgical History:  Procedure Laterality Date  . ABDOMINAL HYSTERECTOMY  7/03   for DUB/ cysts  . masectomy    . MASTECTOMY Left   . uterine cyst removed  5/99    Family History  Problem Relation Age of Onset  . Diabetes Father   . Heart disease Father   . Diabetes Brother   . Diabetes Brother        type 2    Social History   Socioeconomic History  . Marital status: Widowed    Spouse name: Not on file  . Number of children: Not on file  . Years of education: Not on file  . Highest education level: Not on file  Occupational History  . Not on file  Tobacco Use  . Smoking status: Never Smoker  . Smokeless tobacco: Never Used  Substance and Sexual Activity  . Alcohol use: No  . Drug use: No  . Sexual activity: Not on file  Other Topics Concern  . Not on file  Social History Narrative  . Not on file   Social Determinants of Health   Financial Resource Strain: Not on file  Food  Insecurity: Not on file  Transportation Needs: Not on file  Physical Activity: Not on file  Stress: Not on file  Social Connections: Not on file  Intimate Partner Violence: Not on file    Outpatient Medications Prior to Visit  Medication Sig Dispense Refill  . AMBULATORY NON FORMULARY MEDICATION Take 1 tablet by mouth daily. Medication Name:  Berberine supplement    . AMBULATORY NON FORMULARY MEDICATION Take 1 capsule by mouth in the morning and at bedtime. Medication Name: BONE BUILDING SUPPLEMENT    . Ascorbic Acid (SUNKIST VITAMIN C PO) Take 500 mg by mouth daily.    . Blood Glucose Monitoring Suppl (ONE TOUCH ULTRA SYSTEM KIT) W/DEVICE KIT 1 kit by Does not apply route once. Lancets, test strips for testing 3 times a week. Dx: IFG Dx code: R73.01 1 each 0  . cholecalciferol (VITAMIN D) 1000 UNITS tablet Take 1,000 Units by mouth daily.    Marland Kitchen glucose blood (ONE TOUCH ULTRA TEST) test strip Dx: E11.9. Check fasting blood sugar daily. 100 each 11  . Lancets (ONETOUCH DELICA PLUS [id number]) MISC USE   TO CHECK GLUCOSE IN THE MORNING 100 each 0  . MAGNESIUM PO Take by mouth daily.    Marland Kitchen  NP THYROID 30 MG tablet Take 30 mg by mouth daily.    . Nutritional Supplements (DHEA) 15-50 MG CAPS Take 15 mg by mouth daily.    . TURMERIC PO Take by mouth.     No facility-administered medications prior to visit.    Allergies  Allergen Reactions  . Metformin And Related Other (See Comments)    Joint pain and constipation  . Moxifloxacin Hcl In Nacl Hives  . Penicillins Hives    REACTION: unknown rx  . Quinolones Hives    In particular Avelox    ROS Review of Systems    Objective:    Physical Exam Vitals reviewed.  Constitutional:      Appearance: She is well-developed.  HENT:     Head: Normocephalic and atraumatic.  Eyes:     Conjunctiva/sclera: Conjunctivae normal.  Cardiovascular:     Rate and Rhythm: Normal rate.  Pulmonary:     Effort: Pulmonary effort is normal.  Skin:     General: Skin is dry.     Coloration: Skin is not pale.  Neurological:     Mental Status: She is alert and oriented to person, place, and time.  Psychiatric:        Behavior: Behavior normal.     BP 133/71   Pulse 76   Ht 5' 7"  (1.702 m)   Wt 180 lb (81.6 kg)   SpO2 99%   BMI 28.19 kg/m  Wt Readings from Last 3 Encounters:  05/19/20 180 lb (81.6 kg)  02/20/20 178 lb (80.7 kg)  11/07/19 180 lb (81.6 kg)     There are no preventive care reminders to display for this patient.  There are no preventive care reminders to display for this patient.  Lab Results  Component Value Date   TSH 2.60 07/29/2015   Lab Results  Component Value Date   WBC 6.3 07/18/2019   HGB 13.4 07/18/2019   HCT 38.1 07/18/2019   MCV 89.6 07/18/2019   PLT 320 07/18/2019   Lab Results  Component Value Date   NA 139 11/07/2019   K 4.3 11/07/2019   CO2 26 11/07/2019   GLUCOSE 169 (H) 11/07/2019   BUN 19 11/07/2019   CREATININE 0.75 11/07/2019   BILITOT 0.7 11/07/2019   ALKPHOS 54 05/05/2016   AST 9 (L) 11/07/2019   ALT 11 11/07/2019   PROT 7.2 11/07/2019   ALBUMIN 4.3 05/05/2016   CALCIUM 9.5 11/07/2019   Lab Results  Component Value Date   CHOL 190 11/07/2019   Lab Results  Component Value Date   HDL 64 11/07/2019   Lab Results  Component Value Date   LDLCALC 107 (H) 11/07/2019   Lab Results  Component Value Date   TRIG 95 11/07/2019   Lab Results  Component Value Date   CHOLHDL 3.0 11/07/2019   Lab Results  Component Value Date   HGBA1C 7.9 (A) 05/19/2020      Assessment & Plan:   Problem List Items Addressed This Visit      Endocrine   Uncontrolled diabetes mellitus ([place]) - Primary    A1C unchanged from prior.  Still 7.9 on her current regimen.  We discussed getting back on metformin.  Discussed pros and cons of potential side effects of medication.  We will start with metformin 5 mg ER.  New prescription sent to pharmacy.  I do want her to continue with her  current regimen as well.  And then follow-up in 3 months we will consider  added really love to see her increase her walking and start getting into a regular routine with that.      Relevant Medications   metFORMIN (GLUCOPHAGE-XR) 500 MG 24 hr tablet   Other Relevant Orders   POCT glycosylated hemoglobin (Hb A1C) (Completed)   BASIC METABOLIC PANEL WITH GFR   Microalbuminuria due to type 2 diabetes mellitus (HCC)   Relevant Medications   metFORMIN (GLUCOPHAGE-XR) 500 MG 24 hr tablet   Other Relevant Orders   BASIC METABOLIC PANEL WITH GFR      Meds ordered this encounter  Medications  . metFORMIN (GLUCOPHAGE-XR) 500 MG 24 hr tablet    Sig: Take 1 tablet (500 mg total) by mouth daily with breakfast.    Dispense:  90 tablet    Refill:  0    Follow-up: Return in about 3 months (around 08/19/2020) for Diabetes follow-up.    Beatrice Lecher, MD

## 2020-05-20 ENCOUNTER — Ambulatory Visit (INDEPENDENT_AMBULATORY_CARE_PROVIDER_SITE_OTHER): Payer: Medicare HMO | Admitting: Family Medicine

## 2020-05-20 DIAGNOSIS — Z0000 Encounter for general adult medical examination without abnormal findings: Secondary | ICD-10-CM | POA: Diagnosis not present

## 2020-05-20 DIAGNOSIS — Z1231 Encounter for screening mammogram for malignant neoplasm of breast: Secondary | ICD-10-CM

## 2020-05-20 LAB — BASIC METABOLIC PANEL WITH GFR
BUN: 16 mg/dL (ref 7–25)
CO2: 29 mmol/L (ref 20–32)
Calcium: 9.4 mg/dL (ref 8.6–10.4)
Chloride: 101 mmol/L (ref 98–110)
Creat: 0.72 mg/dL (ref 0.60–0.93)
GFR, Est African American: 98 mL/min/{1.73_m2} (ref >=60)
GFR, Est Non African American: 85 mL/min/{1.73_m2} (ref >=60)
Glucose, Bld: 186 mg/dL — ABNORMAL HIGH (ref 65–99)
Potassium: 4.3 mmol/L (ref 3.5–5.3)
Sodium: 139 mmol/L (ref 135–146)

## 2020-05-20 NOTE — Patient Instructions (Addendum)
[place] Maintenance Summary and Written Plan of Care  Janet Mccullough ,  Thank you for allowing me to perform your Medicare Annual Wellness Visit and for your ongoing commitment to your health.   Health Maintenance & Immunization History Health Maintenance  Topic Date Due  . TETANUS/TDAP  07/17/2020 (Originally 01/11/2012)  . COVID-19 Vaccine (1) 05/07/2024 (Originally 03/04/1962)  . INFLUENZA VACCINE  04/10/2027 (Originally 08/10/2020)  . PNA vac Low Risk Adult (1 of 2 - PCV13) 05/10/2027 (Originally 03/05/2015)  . HEMOGLOBIN A1C  11/19/2020  . URINE MICROALBUMIN  02/19/2021  . OPHTHALMOLOGY EXAM  03/09/2021  . FOOT EXAM  05/19/2021  . MAMMOGRAM  11/13/2021  . Fecal DNA (Cologuard)  09/05/2022  . DEXA SCAN  10/23/2028  . Hepatitis C Screening  Completed  . HPV VACCINES  Aged Out   Immunization History  Administered Date(s) Administered  . Td 01/10/2002    These are the patient goals that we discussed: Goals Addressed              This Visit's Progress   .  Patient Stated (pt-stated)        05/20/2020 AWV Goal: Diabetes Management  . Patient will maintain an A1C level below 7.5 . Patient will not develop any diabetic foot complications . Patient will not experience any hypoglycemic episodes over the next 3 months . Patient will notify our office of any CBG readings outside of the provider recommended range by calling [phone] . Patient will adhere to provider recommendations for diabetes management  Patient Self Management Activities . take all medications as prescribed and report any negative side effects . monitor and record blood sugar readings as directed . adhere to a low carbohydrate diet that incorporates lean proteins, vegetables, whole grains, low glycemic fruits . check feet daily noting any sores, cracks, injuries, or callous formations . see PCP or podiatrist if she notices any changes in her legs, feet, or toenails . Patient  will visit PCP and have an A1C level checked every 3 to 6 months as directed  . have a yearly eye exam to monitor for vascular changes associated with diabetes and will request that the report be sent to her pcp.  . consult with her PCP regarding any changes in her health or new or worsening symptoms         This is a list of Health Maintenance Items that are overdue or due now: Pneumococcal vaccine  Influenza vaccine Td vaccine Screening mammography Covid vaccine  Shingles vaccine  Orders/Referrals Placed Today: Orders Placed This Encounter  Procedures  . Mammogram 3D SCREEN BREAST BILATERAL    Standing Status:   Future    Standing Expiration Date:   05/20/2021    Scheduling Instructions:     Please call patient to schedule. She will be due in November.    Order Specific Question:   Reason for Exam (SYMPTOM  OR DIAGNOSIS REQUIRED)    Answer:   breast cancer screening    Order Specific Question:   Preferred imaging location?    Answer:   [facility]   (Contact our referral department at [phone] if you have not spoken with someone about your referral appointment within the next 5 days)    Follow-up Plan . Follow-up with Hali Marry, MD as planned . Schedule your mammogram in November, 2022 . Medicare wellness in one year.       Health Maintenance, Female Adopting a healthy lifestyle and getting preventive care  are important in promoting health and wellness. Ask your health care provider about:  The right schedule for you to have regular tests and exams.  Things you can do on your own to prevent diseases and keep yourself healthy. What should I know about diet, weight, and exercise? Eat a healthy diet  Eat a diet that includes plenty of vegetables, fruits, low-fat dairy products, and lean protein.  Do not eat a lot of foods that are high in solid fats, added sugars, or sodium.   Maintain a healthy weight Body mass index (BMI) is used to  identify weight problems. It estimates body fat based on height and weight. Your health care provider can help determine your BMI and help you achieve or maintain a healthy weight. Get regular exercise Get regular exercise. This is one of the most important things you can do for your health. Most adults should:  Exercise for at least 150 minutes each week. The exercise should increase your heart rate and make you sweat (moderate-intensity exercise).  Do strengthening exercises at least twice a week. This is in addition to the moderate-intensity exercise.  Spend less time sitting. Even light physical activity can be beneficial. Watch cholesterol and blood lipids Have your blood tested for lipids and cholesterol at 69 years of age, then have this test every 5 years. Have your cholesterol levels checked more often if:  Your lipid or cholesterol levels are high.  You are older than 70 years of age.  You are at high risk for heart disease. What should I know about cancer screening? Depending on your health history and family history, you may need to have cancer screening at various ages. This may include screening for:  Breast cancer.  Cervical cancer.  Colorectal cancer.  Skin cancer.  Lung cancer. What should I know about heart disease, diabetes, and high blood pressure? Blood pressure and heart disease  High blood pressure causes heart disease and increases the risk of stroke. This is more likely to develop in people who have high blood pressure readings, are of African descent, or are overweight.  Have your blood pressure checked: ? Every 3-5 years if you are 48-19 years of age. ? Every year if you are 19 years old or older. Diabetes Have regular diabetes screenings. This checks your fasting blood sugar level. Have the screening done:  Once every three years after age 26 if you are at a normal weight and have a low risk for diabetes.  More often and at a younger age if you  are overweight or have a high risk for diabetes. What should I know about preventing infection? Hepatitis B If you have a higher risk for hepatitis B, you should be screened for this virus. Talk with your health care provider to find out if you are at risk for hepatitis B infection. Hepatitis C Testing is recommended for:  Everyone born from [birth year] through 1965.  Anyone with known risk factors for hepatitis C. Sexually transmitted infections (STIs)  Get screened for STIs, including gonorrhea and chlamydia, if: ? You are sexually active and are younger than 71 years of age. ? You are older than 70 years of age and your health care provider tells you that you are at risk for this type of infection. ? Your sexual activity has changed since you were last screened, and you are at increased risk for chlamydia or gonorrhea. Ask your health care provider if you are at risk.  Ask your health care  provider about whether you are at high risk for HIV. Your health care provider may recommend a prescription medicine to help prevent HIV infection. If you choose to take medicine to prevent HIV, you should first get tested for HIV. You should then be tested every 3 months for as long as you are taking the medicine. Pregnancy  If you are about to stop having your period (premenopausal) and you may become pregnant, seek counseling before you get pregnant.  Take 400 to 800 micrograms (mcg) of folic acid every day if you become pregnant.  Ask for birth control (contraception) if you want to prevent pregnancy. Osteoporosis and menopause Osteoporosis is a disease in which the bones lose minerals and strength with aging. This can result in bone fractures. If you are 41 years old or older, or if you are at risk for osteoporosis and fractures, ask your health care provider if you should:  Be screened for bone loss.  Take a calcium or vitamin D supplement to lower your risk of fractures.  Be given hormone  replacement therapy (HRT) to treat symptoms of menopause. Follow these instructions at home: Lifestyle  Do not use any products that contain nicotine or tobacco, such as cigarettes, e-cigarettes, and chewing tobacco. If you need help quitting, ask your health care provider.  Do not use street drugs.  Do not share needles.  Ask your health care provider for help if you need support or information about quitting drugs. Alcohol use  Do not drink alcohol if: ? Your health care provider tells you not to drink. ? You are pregnant, may be pregnant, or are planning to become pregnant.  If you drink alcohol: ? Limit how much you use to 0-1 drink a day. ? Limit intake if you are breastfeeding.  Be aware of how much alcohol is in your drink. In the [place] one drink equals one 12 oz bottle of beer (355 mL), one 5 oz glass of wine (148 mL), or one 1 oz glass of hard liquor (44 mL). General instructions  Schedule regular health, dental, and eye exams.  Stay current with your vaccines.  Tell your health care provider if: ? You often feel depressed. ? You have ever been abused or do not feel safe at home. Summary  Adopting a healthy lifestyle and getting preventive care are important in promoting health and wellness.  Follow your health care provider's instructions about healthy diet, exercising, and getting tested or screened for diseases.  Follow your health care provider's instructions on monitoring your cholesterol and blood pressure. This information is not intended to replace advice given to you by your health care provider. Make sure you discuss any questions you have with your health care provider. Document Revised: 12/20/2017 Document Reviewed: 12/20/2017 Elsevier Patient Education  2021 [employer].

## 2020-05-20 NOTE — Progress Notes (Signed)
MEDICARE ANNUAL WELLNESS VISIT  05/20/2020  Telephone Visit Disclaimer This Medicare AWV was conducted by telephone due to national recommendations for restrictions regarding the COVID-19 Pandemic (e.g. social distancing).  I verified, using two identifiers, that I am speaking with Janet Mccullough or their authorized healthcare agent. I discussed the limitations, risks, security, and privacy concerns of performing an evaluation and management service by telephone and the potential availability of an in-person appointment in the future. The patient expressed understanding and agreed to proceed.  Location of Patient: Home Location of Provider (nurse):  In the office.  Subjective:    Janet Mccullough is a 70 y.o. female patient of Metheney, Rene Kocher, MD who had a Medicare Annual Wellness Visit today via telephone. Janet Mccullough is Retired and lives alone. she has 2 children and 2 step children. she reports that she is socially active and does interact with friends/family regularly. she is minimally physically active and enjoys doing crafts, reading and learning how to quilt.  Patient Care Team: Hali Marry, MD as PCP - General (Family Medicine)  Advanced Directives 05/20/2020 10/03/2016  Does Patient Have a Medical Advance Directive? Yes Yes  Type of Paramedic of [place];Living will [place];Living will  Does patient want to make changes to medical advance directive? No - Patient declined -  Copy of [place] in Chart? No - copy requested No - copy requested  Would patient like information on creating a medical advance directive? No - Patient declined [facility] Utilization Over the Past 12 Months: # of hospitalizations or ER visits: 0 # of surgeries: 0  Review of Systems    Patient reports that her overall health is unchanged compared to last year.  History obtained from chart review and the patient  Patient  Reported Readings (BP, Pulse, CBG, Weight, etc) none  Pain Assessment Pain : No/denies pain     Current Medications & Allergies (verified) Allergies as of 05/20/2020      Reactions   Metformin And Related Other (See Comments)   Joint pain and constipation   Moxifloxacin Hcl In Nacl Hives   Penicillins Hives   REACTION: unknown rx   Quinolones Hives   In particular Avelox      Medication List       Accurate as of May 20, 2020 11:39 AM. If you have any questions, ask your nurse or doctor.        AMBULATORY NON FORMULARY MEDICATION Take 1 tablet by mouth daily. Medication Name:  Berberine supplement   AMBULATORY NON FORMULARY MEDICATION Take 1 capsule by mouth in the morning and at bedtime. Medication Name: BONE BUILDING SUPPLEMENT   cholecalciferol 1000 units tablet Commonly known as: VITAMIN D Take 1,000 Units by mouth daily.   DHEA 15-50 MG Caps Take 15 mg by mouth daily.   glucose blood test strip Commonly known as: ONE TOUCH ULTRA TEST Dx: E11.9. Check fasting blood sugar daily.   MAGNESIUM PO Take by mouth daily.   metFORMIN 500 MG 24 hr tablet Commonly known as: GLUCOPHAGE-XR Take 1 tablet (500 mg total) by mouth daily with breakfast.   NP Thyroid 30 MG tablet Generic drug: thyroid Take 30 mg by mouth daily.   ONE TOUCH ULTRA SYSTEM KIT w/Device Kit 1 kit by Does not apply route once. Lancets, test strips for testing 3 times a week. Dx: IFG Dx code: H74.16   OneTouch Delica Plus [id number] Misc USE  TO CHECK GLUCOSE IN THE MORNING   SUNKIST VITAMIN C PO Take 500 mg by mouth daily.   TURMERIC PO Take by mouth.       History (reviewed): Past Medical History:  Diagnosis Date  . Cancer (HCC)    breast  . Impaired fasting glucose   . Microscopic hematuria    renal ultrasound normal (Dr Diona Fanti)  . Normal echocardiogram    EF 60-65%   Past Surgical History:  Procedure Laterality Date  . ABDOMINAL HYSTERECTOMY  7/03   for DUB/ cysts   . masectomy    . MASTECTOMY Left   . uterine cyst removed  5/99   Family History  Problem Relation Age of Onset  . Diabetes Father   . Heart disease Father   . Diabetes Brother   . Diabetes Brother        type 2   Social History   Socioeconomic History  . Marital status: Widowed    Spouse name: Not on file  . Number of children: 2  . Years of education: 43  . Highest education level: Some college, no degree  Occupational History    Comment: Retired  Tobacco Use  . Smoking status: Never Smoker  . Smokeless tobacco: Never Used  Substance and Sexual Activity  . Alcohol use: No  . Drug use: No  . Sexual activity: Not on file  Other Topics Concern  . Not on file  Social History Narrative   Lives alone. She has two children of her own and two step children. She enjoys doing crafts, read and learning how to quilt.   Social Determinants of Health   Financial Resource Strain: Low Risk   . Difficulty of Paying Living Expenses: Not hard at all  Food Insecurity: No Food Insecurity  . Worried About Charity fundraiser in the Last Year: Never true  . Ran Out of Food in the Last Year: Never true  Transportation Needs: No Transportation Needs  . Lack of Transportation (Medical): No  . Lack of Transportation (Non-Medical): No  Physical Activity: Inactive  . Days of Exercise per Week: 0 days  . Minutes of Exercise per Session: 0 min  Stress: No Stress Concern Present  . Feeling of Stress : Not at all  Social Connections: Moderately Isolated  . Frequency of Communication with Friends and Family: More than three times a week  . Frequency of Social Gatherings with Friends and Family: More than three times a week  . Attends Religious Services: Never  . Active Member of Clubs or Organizations: Yes  . Attends Archivist Meetings: More than 4 times per year  . Marital Status: Widowed    Activities of Daily Living In your present state of health, do you have any  difficulty performing the following activities: 05/20/2020  Hearing? N  Vision? N  Difficulty concentrating or making decisions? N  Walking or climbing stairs? N  Dressing or bathing? N  Doing errands, shopping? N  Preparing Food and eating ? N  Using the Toilet? N  In the past six months, have you accidently leaked urine? Y  Comment stress incontinence  Do you have problems with loss of bowel control? N  Managing your Medications? N  Managing your Finances? N  Housekeeping or managing your Housekeeping? N  Some recent data might be hidden    Patient Education/ Literacy How often do you need to have someone help you when you read instructions, pamphlets, or other written materials  from your doctor or pharmacy?: 1 - Never What is the last grade level you completed in school?: High school graduate/some college  Exercise Current Exercise Habits: The patient does not participate in regular exercise at present, Exercise limited by: None identified  Diet Patient reports consuming 2-3 meals a day and 1-2 snack(s) a day Patient reports that her primary diet is: Regular Patient reports that she does have regular access to food.   Depression Screen PHQ 2/9 Scores 05/20/2020 05/19/2020 08/07/2019 05/08/2019 10/10/2018 07/10/2018 01/08/2018  PHQ - 2 Score 0 0 0 0 0 0 0  PHQ- 9 Score - - 3 - - - -     Fall Risk Fall Risk  05/20/2020 05/19/2020 08/07/2019 05/08/2019 10/10/2018  Falls in the past year? 0 0 0 0 0  Number falls in past yr: 0 0 0 - 0  Injury with Fall? 0 0 0 - 0  Risk for fall due to : - No Fall Risks - No Fall Risks -  Follow up Education provided;Falls evaluation completed Falls prevention discussed;Falls evaluation completed Falls evaluation completed - -     Objective:  Janet Mccullough seemed alert and oriented and she participated appropriately during our telephone visit.  Blood Pressure Weight BMI  BP Readings from Last 3 Encounters:  05/19/20 133/71  02/20/20 (!) 123/56   11/07/19 127/63   Wt Readings from Last 3 Encounters:  05/19/20 180 lb (81.6 kg)  02/20/20 178 lb (80.7 kg)  11/07/19 180 lb (81.6 kg)   BMI Readings from Last 1 Encounters:  05/19/20 28.19 kg/m    *Unable to obtain current vital signs, weight, and BMI due to telephone visit type  Hearing/Vision  . Janet Mccullough did not seem to have difficulty with hearing/understanding during the telephone conversation . Reports that she has had a formal eye exam by an eye care professional within the past year . Reports that she has not had a formal hearing evaluation within the past year *Unable to fully assess hearing and vision during telephone visit type  Cognitive Function: 6CIT Screen 05/20/2020  What Year? 0 points  What month? 0 points  What time? 0 points  Count back from 20 0 points  Months in reverse 0 points  Repeat phrase 0 points  Total Score 0   (Normal:0-7, Significant for Dysfunction: >8)  Normal Cognitive Function Screening: Yes   Immunization & Health Maintenance Record Immunization History  Administered Date(s) Administered  . Td 01/10/2002    Health Maintenance  Topic Date Due  . TETANUS/TDAP  07/17/2020 (Originally 01/11/2012)  . COVID-19 Vaccine (1) 05/07/2024 (Originally 03[birth year]/1962)  . INFLUENZA VACCINE  04/10/2027 (Originally 08/10/2020)  . PNA vac Low Risk Adult (1 of 2 - PCV13) 05/10/2027 (Originally 03/05/2015)  . HEMOGLOBIN A1C  11/19/2020  . URINE MICROALBUMIN  02/19/2021  . OPHTHALMOLOGY EXAM  03/09/2021  . FOOT EXAM  05/19/2021  . MAMMOGRAM  11/13/2021  . Fecal DNA (Cologuard)  09/05/2022  . DEXA SCAN  10/23/2028  . Hepatitis C Screening  Completed  . HPV VACCINES  Aged Out       Assessment  This is a routine wellness examination for Janet Mccullough.  Health Maintenance: Due or Overdue There are no preventive care reminders to display for this patient.  Janet Mccullough does not need a referral for Community Assistance: Care Management:   no Social  Work:    no Prescription Assistance:  no Nutrition/Diabetes Education:  no   Plan:  Personalized Goals Goals Addressed  This Visit's Progress   .  Patient Stated (pt-stated)        05/20/2020 AWV Goal: Diabetes Management  . Patient will maintain an A1C level below 7.5 . Patient will not develop any diabetic foot complications . Patient will not experience any hypoglycemic episodes over the next 3 months . Patient will notify our office of any CBG readings outside of the provider recommended range by calling [phone] . Patient will adhere to provider recommendations for diabetes management  Patient Self Management Activities . take all medications as prescribed and report any negative side effects . monitor and record blood sugar readings as directed . adhere to a low carbohydrate diet that incorporates lean proteins, vegetables, whole grains, low glycemic fruits . check feet daily noting any sores, cracks, injuries, or callous formations . see PCP or podiatrist if she notices any changes in her legs, feet, or toenails . Patient will visit PCP and have an A1C level checked every 3 to 6 months as directed  . have a yearly eye exam to monitor for vascular changes associated with diabetes and will request that the report be sent to her pcp.  . consult with her PCP regarding any changes in her health or new or worsening symptoms       Personalized Health Maintenance & Screening Recommendations  Pneumococcal vaccine  Influenza vaccine Td vaccine Screening mammography Covid vaccine  Shingles vaccine  Patient declined all of the vaccines at this time.   Lung Cancer Screening Recommended: no (Low Dose CT Chest recommended if Age 52-80 years, 30 pack-year currently smoking OR have quit w/in past 15 years) Hepatitis C Screening recommended: no HIV Screening recommended: no  Advanced Directives: Written information was not prepared per patient's  request.  Referrals & Orders Orders Placed This Encounter  Procedures  . Mammogram 3D SCREEN BREAST BILATERAL    Follow-up Plan . Follow-up with Hali Marry, MD as planned . Schedule your mammogram in November, 2022 . Medicare wellness in one year.   I have personally reviewed and noted the following in the patient's chart:   . Medical and social history . Use of alcohol, tobacco or illicit drugs  . Current medications and supplements . Functional ability and status . Nutritional status . Physical activity . Advanced directives . List of other physicians . Hospitalizations, surgeries, and ER visits in previous 12 months . Vitals . Screenings to include cognitive, depression, and falls . Referrals and appointments  In addition, I have reviewed and discussed with Janet Mccullough certain preventive protocols, quality metrics, and best practice recommendations. A written personalized care plan for preventive services as well as general preventive health recommendations is available and can be mailed to the patient at her request.      Tinnie Gens, RN  05/20/2020

## 2020-06-01 ENCOUNTER — Encounter: Payer: Self-pay | Admitting: Family Medicine

## 2020-06-01 ENCOUNTER — Other Ambulatory Visit: Payer: Self-pay

## 2020-06-01 ENCOUNTER — Ambulatory Visit (INDEPENDENT_AMBULATORY_CARE_PROVIDER_SITE_OTHER): Payer: Medicare HMO | Admitting: Family Medicine

## 2020-06-01 VITALS — BP 135/68 | HR 92 | Ht 67.0 in | Wt 180.0 lb

## 2020-06-01 DIAGNOSIS — M25511 Pain in right shoulder: Secondary | ICD-10-CM | POA: Diagnosis not present

## 2020-06-01 NOTE — Progress Notes (Signed)
Acute Office Visit  Subjective:    Patient ID: Janet Mccullough, female    DOB: February 12, 1950, 70 y.o.   MRN: [id number]  Chief Complaint  Patient presents with  . Arm Pain    HPI Patient is in today for right upper arm pain she said it started about a week ago when she had been feeling some Ziploc bags with linens and blankets and then pushing the air out of them.  She said she was not lifting anything head of the though she was doing some repetitive movements.  She has chronic neck pain but says its not worse than usual.  She has not noticed any limitation in range of motion she says it always feels a lot more stiff first thing in the morning and then after couple hours seems to feel little better.  Though it feels more comfortable if she raises her shoulder up. seh is right handed.   Past Medical History:  Diagnosis Date  . Cancer (HCC)    breast  . Impaired fasting glucose   . Microscopic hematuria    renal ultrasound normal (Dr Diona Fanti)  . Normal echocardiogram    EF 60-65%    Past Surgical History:  Procedure Laterality Date  . ABDOMINAL HYSTERECTOMY  7/03   for DUB/ cysts  . masectomy    . MASTECTOMY Left   . uterine cyst removed  5/99    Family History  Problem Relation Age of Onset  . Diabetes Father   . Heart disease Father   . Diabetes Brother   . Diabetes Brother        type 2    Social History   Socioeconomic History  . Marital status: Widowed    Spouse name: Not on file  . Number of children: 2  . Years of education: 2  . Highest education level: Some college, no degree  Occupational History    Comment: Retired  Tobacco Use  . Smoking status: Never Smoker  . Smokeless tobacco: Never Used  Substance and Sexual Activity  . Alcohol use: No  . Drug use: No  . Sexual activity: Not on file  Other Topics Concern  . Not on file  Social History Narrative   Lives alone. She has two children of her own and two step children. She enjoys doing crafts,  read and learning how to quilt.   Social Determinants of Health   Financial Resource Strain: Low Risk   . Difficulty of Paying Living Expenses: Not hard at all  Food Insecurity: No Food Insecurity  . Worried About Charity fundraiser in the Last Year: Never true  . Ran Out of Food in the Last Year: Never true  Transportation Needs: No Transportation Needs  . Lack of Transportation (Medical): No  . Lack of Transportation (Non-Medical): No  Physical Activity: Inactive  . Days of Exercise per Week: 0 days  . Minutes of Exercise per Session: 0 min  Stress: No Stress Concern Present  . Feeling of Stress : Not at all  Social Connections: Moderately Isolated  . Frequency of Communication with Friends and Family: More than three times a week  . Frequency of Social Gatherings with Friends and Family: More than three times a week  . Attends Religious Services: Never  . Active Member of Clubs or Organizations: Yes  . Attends Archivist Meetings: More than 4 times per year  . Marital Status: Widowed  Intimate Partner Violence: Not At Risk  . Fear  of Current or Ex-Partner: No  . Emotionally Abused: No  . Physically Abused: No  . Sexually Abused: No    Outpatient Medications Prior to Visit  Medication Sig Dispense Refill  . AMBULATORY NON FORMULARY MEDICATION Take 1 tablet by mouth daily. Medication Name:  Berberine supplement    . AMBULATORY NON FORMULARY MEDICATION Take 1 capsule by mouth in the morning and at bedtime. Medication Name: BONE BUILDING SUPPLEMENT    . Ascorbic Acid (SUNKIST VITAMIN C PO) Take 500 mg by mouth daily.    . Blood Glucose Monitoring Suppl (ONE TOUCH ULTRA SYSTEM KIT) W/DEVICE KIT 1 kit by Does not apply route once. Lancets, test strips for testing 3 times a week. Dx: IFG Dx code: R73.01 1 each 0  . cholecalciferol (VITAMIN D) 1000 UNITS tablet Take 1,000 Units by mouth daily.    Marland Kitchen glucose blood (ONE TOUCH ULTRA TEST) test strip Dx: E11.9. Check fasting  blood sugar daily. 100 each 11  . Lancets (ONETOUCH DELICA PLUS [id number]) MISC USE   TO CHECK GLUCOSE IN THE MORNING 100 each 0  . MAGNESIUM PO Take by mouth daily.    . metFORMIN (GLUCOPHAGE-XR) 500 MG 24 hr tablet Take 1 tablet (500 mg total) by mouth daily with breakfast. 90 tablet 0  . NP THYROID 30 MG tablet Take 30 mg by mouth daily.    . Nutritional Supplements (DHEA) 15-50 MG CAPS Take 15 mg by mouth daily.    . TURMERIC PO Take by mouth.     No facility-administered medications prior to visit.    Allergies  Allergen Reactions  . Metformin And Related Other (See Comments)    Joint pain and constipation  . Moxifloxacin Hcl In Nacl Hives  . Penicillins Hives    REACTION: unknown rx  . Quinolones Hives    In particular Avelox    Review of Systems     Objective:    Physical Exam  BP 135/68   Pulse 92   Ht 5' 7" (1.702 m)   Wt 180 lb (81.6 kg)   SpO2 98%   BMI 28.19 kg/m  Wt Readings from Last 3 Encounters:  06/01/20 180 lb (81.6 kg)  05/19/20 180 lb (81.6 kg)  02/20/20 178 lb (80.7 kg)    There are no preventive care reminders to display for this patient.  There are no preventive care reminders to display for this patient.   Lab Results  Component Value Date   TSH 2.60 07/29/2015   Lab Results  Component Value Date   WBC 6.3 07/18/2019   HGB 13.4 07/18/2019   HCT 38.1 07/18/2019   MCV 89.6 07/18/2019   PLT 320 07/18/2019   Lab Results  Component Value Date   NA 139 05/19/2020   K 4.3 05/19/2020   CO2 29 05/19/2020   GLUCOSE 186 (H) 05/19/2020   BUN 16 05/19/2020   CREATININE 0.72 05/19/2020   BILITOT 0.7 11/07/2019   ALKPHOS 54 05/05/2016   AST 9 (L) 11/07/2019   ALT 11 11/07/2019   PROT 7.2 11/07/2019   ALBUMIN 4.3 05/05/2016   CALCIUM 9.4 05/19/2020   Lab Results  Component Value Date   CHOL 190 11/07/2019   Lab Results  Component Value Date   HDL 64 11/07/2019   Lab Results  Component Value Date   LDLCALC 107 (H) 11/07/2019    Lab Results  Component Value Date   TRIG 95 11/07/2019   Lab Results  Component Value Date   CHOLHDL  3.0 11/07/2019   Lab Results  Component Value Date   HGBA1C 7.9 (A) 05/19/2020       Assessment & Plan:   Problem List Items Addressed This Visit   None   Visit Diagnoses    Acute pain of right shoulder    -  Primary     Right shoulder pain-suspect bursitis.  Recommend ice, anti-inflammatory and range of motion exercises given on handout.  If not improving over the next 2 to 3 weeks then please let us know.  No indication of fracture or injury to recommend x-ray at this point.  No orders of the defined types were placed in this encounter.    Beatrice Lecher, MD

## 2020-06-04 ENCOUNTER — Other Ambulatory Visit: Payer: Self-pay | Admitting: *Deleted

## 2020-06-04 DIAGNOSIS — E119 Type 2 diabetes mellitus without complications: Secondary | ICD-10-CM

## 2020-06-04 MED ORDER — GLUCOSE BLOOD VI STRP: ORAL_STRIP | 11 refills | Status: DC

## 2020-07-16 ENCOUNTER — Other Ambulatory Visit: Payer: Self-pay | Admitting: Family Medicine

## 2020-07-16 DIAGNOSIS — E119 Type 2 diabetes mellitus without complications: Secondary | ICD-10-CM

## 2020-07-22 ENCOUNTER — Telehealth: Payer: Self-pay | Admitting: Family Medicine

## 2020-07-22 NOTE — Chronic Care Management (AMB) (Signed)
  Chronic Care Management   Outreach Note  07/22/2020 Name: Janet Mccullough MRN: [id number] DOB: 1950/07/27  Referred by: Hali Marry, MD Reason for referral : No chief complaint on file.   An unsuccessful telephone outreach was attempted today. The patient was referred to the pharmacist for assistance with care management and care coordination.   Follow Up Plan:   Lauretta Grill Upstream Scheduler

## 2020-07-23 ENCOUNTER — Telehealth: Payer: Self-pay | Admitting: Family Medicine

## 2020-07-23 NOTE — Chronic Care Management (AMB) (Signed)
  Chronic Care Management   Note  07/23/2020 Name: Janet Mccullough MRN: [id number] DOB: 1950-02-18  Janet Mccullough is a 70 y.o. year old female who is a primary care patient of Metheney, Rene Kocher, MD. I reached out to Kathrin Ruddy by phone today in response to a referral sent by Janet Mccullough PCP, Hali Marry, MD.   Janet Mccullough was given information about Chronic Care Management services today including:  CCM service includes personalized support from designated clinical staff supervised by her physician, including individualized plan of care and coordination with other care providers 24/7 contact phone numbers for assistance for urgent and routine care needs. Service will only be billed when office clinical staff spend 20 minutes or more in a month to coordinate care. Only one practitioner may furnish and bill the service in a calendar month. The patient may stop CCM services at any time (effective at the end of the month) by phone call to the office staff.   Patient agreed to services and verbal consent obtained.   Follow up plan:   Lauretta Grill Upstream Scheduler

## 2020-08-11 ENCOUNTER — Other Ambulatory Visit: Payer: Self-pay | Admitting: Family Medicine

## 2020-08-11 DIAGNOSIS — E1165 Type 2 diabetes mellitus with hyperglycemia: Secondary | ICD-10-CM

## 2020-08-24 ENCOUNTER — Encounter: Payer: Self-pay | Admitting: Family Medicine

## 2020-08-24 ENCOUNTER — Other Ambulatory Visit: Payer: Self-pay

## 2020-08-24 ENCOUNTER — Ambulatory Visit (INDEPENDENT_AMBULATORY_CARE_PROVIDER_SITE_OTHER): Payer: Medicare HMO | Admitting: Family Medicine

## 2020-08-24 VITALS — BP 114/64 | HR 79 | Ht 67.0 in | Wt 182.0 lb

## 2020-08-24 DIAGNOSIS — E1165 Type 2 diabetes mellitus with hyperglycemia: Secondary | ICD-10-CM

## 2020-08-24 DIAGNOSIS — E119 Type 2 diabetes mellitus without complications: Secondary | ICD-10-CM

## 2020-08-24 DIAGNOSIS — M8580 Other specified disorders of bone density and structure, unspecified site: Secondary | ICD-10-CM

## 2020-08-24 DIAGNOSIS — R5383 Other fatigue: Secondary | ICD-10-CM

## 2020-08-24 LAB — POCT GLYCOSYLATED HEMOGLOBIN (HGB A1C): Hemoglobin A1C: 7.8 % — AB (ref 4.0–5.6)

## 2020-08-24 NOTE — Assessment & Plan Note (Signed)
He does take a bone supplement as well as vitamin D.

## 2020-08-24 NOTE — Assessment & Plan Note (Signed)
A1c does look a little bit better today at 7.8 so I do think adding in the metformin has been helpful although I did not see a significant drop in the A1c as I was expecting with the addition of the metformin.  Just encouraged her to take it regularly and continue to work on healthy food choices and adding in regular exercise which can make a tremendous difference.  We discussed working on strength training, balance with things like yoga and may be starting with small walks such as 10 minutes 3 times a week.

## 2020-08-24 NOTE — Progress Notes (Signed)
She reports that she d/c'd the NP Thyroid about 1 month ago stating that she didn't notice any difference in her energy level or how she was feeling while on the medication.

## 2020-08-24 NOTE — Progress Notes (Signed)
Established Patient Office Visit  Subjective:  Patient ID: Janet Mccullough, female    DOB: Dec 24, 1950  Age: 70 y.o. MRN: [id number]  CC:  Chief Complaint  Patient presents with   Diabetes    HPI Janet Mccullough presents for   Diabetes - no hypoglycemic events. No wounds or sores that are not healing well. No increased thirst or urination. Checking glucose at home. Taking medications as prescribed without any side effects.  Still taking cinnamon capsules as well as metformin 5 mg ER.  Staying active but no specific exercise.  Reports she is been particularly struggling with fatigue though her sleep is okay and she feels like her mood is good.  She denies any abnormal fevers, night sweats.  No abnormal lumps or bumps on the skin.  He did come off of her thyroid medication about a month ago because she was not noticing any improvement in weight loss she does follow with integrative medicine.  She did contact them first about coming off.  She has not noticed a big difference off of it either.   Past Medical History:  Diagnosis Date   Cancer ([place])    breast   Impaired fasting glucose    Microscopic hematuria    renal ultrasound normal (Dr Diona Fanti)   Normal echocardiogram    EF 60-65%    Past Surgical History:  Procedure Laterality Date   ABDOMINAL HYSTERECTOMY  7/03   for DUB/ cysts   masectomy     MASTECTOMY Left    uterine cyst removed  5/99    Family History  Problem Relation Age of Onset   Diabetes Father    Heart disease Father    Diabetes Brother    Diabetes Brother        type 2    Social History   Socioeconomic History   Marital status: Widowed    Spouse name: Not on file   Number of children: 2   Years of education: 76   Highest education level: Some college, no degree  Occupational History    Comment: Retired  Tobacco Use   Smoking status: Never   Smokeless tobacco: Never  Substance and Sexual Activity   Alcohol use: No   Drug use: No   Sexual  activity: Not on file  Other Topics Concern   Not on file  Social History Narrative   Lives alone. She has two children of her own and two step children. She enjoys doing crafts, read and learning how to quilt.   Social Determinants of Health   Financial Resource Strain: Low Risk    Difficulty of Paying Living Expenses: Not hard at all  Food Insecurity: No Food Insecurity   Worried About Charity fundraiser in the Last Year: Never true   [place] in the Last Year: Never true  Transportation Needs: No Transportation Needs   Lack of Transportation (Medical): No   Lack of Transportation (Non-Medical): No  Physical Activity: Inactive   Days of Exercise per Week: 0 days   Minutes of Exercise per Session: 0 min  Stress: No Stress Concern Present   Feeling of Stress : Not at all  Social Connections: Moderately Isolated   Frequency of Communication with Friends and Family: More than three times a week   Frequency of Social Gatherings with Friends and Family: More than three times a week   Attends Religious Services: Never   Marine scientist or Organizations: Yes   Attends [employer]  or Organization Meetings: More than 4 times per year   Marital Status: Widowed  Human resources officer Violence: Not At Risk   Fear of Current or Ex-Partner: No   Emotionally Abused: No   Physically Abused: No   Sexually Abused: No    Outpatient Medications Prior to Visit  Medication Sig Dispense Refill   AMBULATORY NON FORMULARY MEDICATION Take 1 tablet by mouth daily. Medication Name:  Berberine supplement     AMBULATORY NON FORMULARY MEDICATION Take 1 capsule by mouth in the morning and at bedtime. Medication Name: BONE BUILDING SUPPLEMENT     Ascorbic Acid (SUNKIST VITAMIN C PO) Take 500 mg by mouth daily.     Blood Glucose Monitoring Suppl (ONE TOUCH ULTRA SYSTEM KIT) W/DEVICE KIT 1 kit by Does not apply route once. Lancets, test strips for testing 3 times a week. Dx: IFG Dx code: R73.01 1 each 0    cholecalciferol (VITAMIN D) 1000 UNITS tablet Take 1,000 Units by mouth daily.     glucose blood (ONE TOUCH ULTRA TEST) test strip Dx: E11.9. Check fasting blood sugar daily. 100 each 11   Lancets (ONETOUCH DELICA PLUS [id number]) MISC USE 1  TO CHECK GLUCOSE IN THE MORNING 100 each prn   MAGNESIUM PO Take by mouth daily.     metFORMIN (GLUCOPHAGE-XR) 500 MG 24 hr tablet Take 1 tablet by mouth once daily with breakfast 90 tablet 0   Nutritional Supplements (DHEA) 15-50 MG CAPS Take 15 mg by mouth daily.     TURMERIC PO Take by mouth.     NP THYROID 30 MG tablet Take 30 mg by mouth daily.     No facility-administered medications prior to visit.    Allergies  Allergen Reactions   Metformin And Related Other (See Comments)    Joint pain and constipation   Moxifloxacin Hcl In Nacl Hives   Penicillins Hives    REACTION: unknown rx   Quinolones Hives    In particular Avelox    ROS Review of Systems    Objective:    Physical Exam Constitutional:      Appearance: Normal appearance. She is well-developed.  HENT:     Head: Normocephalic and atraumatic.  Cardiovascular:     Rate and Rhythm: Normal rate and regular rhythm.     Heart sounds: Normal heart sounds.  Pulmonary:     Effort: Pulmonary effort is normal.     Breath sounds: Normal breath sounds.  Skin:    General: Skin is warm and dry.  Neurological:     Mental Status: She is alert and oriented to person, place, and time.  Psychiatric:        Behavior: Behavior normal.    BP 114/64   Pulse 79   Ht 5' 7"  (1.702 m)   Wt 182 lb (82.6 kg)   SpO2 96%   BMI 28.51 kg/m  Wt Readings from Last 3 Encounters:  08/24/20 182 lb (82.6 kg)  06/01/20 180 lb (81.6 kg)  05/19/20 180 lb (81.6 kg)     Health Maintenance Due  Topic Date Due   Zoster Vaccines- Shingrix (1 of 2) Never done   TETANUS/TDAP  01/11/2012    There are no preventive care reminders to display for this patient.  Lab Results  Component Value Date    TSH 2.60 07/29/2015   Lab Results  Component Value Date   WBC 6.3 07/18/2019   HGB 13.4 07/18/2019   HCT 38.1 07/18/2019   MCV 89.6 07/18/2019  PLT 320 07/18/2019   Lab Results  Component Value Date   NA 139 05/19/2020   K 4.3 05/19/2020   CO2 29 05/19/2020   GLUCOSE 186 (H) 05/19/2020   BUN 16 05/19/2020   CREATININE 0.72 05/19/2020   BILITOT 0.7 11/07/2019   ALKPHOS 54 05/05/2016   AST 9 (L) 11/07/2019   ALT 11 11/07/2019   PROT 7.2 11/07/2019   ALBUMIN 4.3 05/05/2016   CALCIUM 9.4 05/19/2020   Lab Results  Component Value Date   CHOL 190 11/07/2019   Lab Results  Component Value Date   HDL 64 11/07/2019   Lab Results  Component Value Date   LDLCALC 107 (H) 11/07/2019   Lab Results  Component Value Date   TRIG 95 11/07/2019   Lab Results  Component Value Date   CHOLHDL 3.0 11/07/2019   Lab Results  Component Value Date   HGBA1C 7.8 (A) 08/24/2020      Assessment & Plan:   Problem List Items Addressed This Visit       Endocrine   Uncontrolled diabetes mellitus ([place])    A1c does look a little bit better today at 7.8 so I do think adding in the metformin has been helpful although I did not see a significant drop in the A1c as I was expecting with the addition of the metformin.  Just encouraged her to take it regularly and continue to work on healthy food choices and adding in regular exercise which can make a tremendous difference.  We discussed working on strength training, balance with things like yoga and may be starting with small walks such as 10 minutes 3 times a week.        Musculoskeletal and Integument   Osteopenia    He does take a bone supplement as well as vitamin D.      Other Visit Diagnoses     Controlled type 2 diabetes mellitus without complication, without long-term current use of insulin ([place])    -  Primary   Relevant Orders   POCT glycosylated hemoglobin (Hb A1C) (Completed)   Fatigue, unspecified type           Fatigue-unclear etiology.  I was hoping once we got her blood sugars under little bit better control that she actually might start to feel a little bit better.  Continue to work on increasing activity and exercise level.  Some component of fatigue can often be deconditioning.  It sounds like her sleep quality and mood are good.  And no red flag symptoms.  She is due for mammography in the fall.  No orders of the defined types were placed in this encounter.   Follow-up: Return in about 15 weeks (around 12/07/2020) for Diabetes follow-up.    Beatrice Lecher, MD

## 2020-08-29 ENCOUNTER — Other Ambulatory Visit: Payer: Self-pay | Admitting: Family Medicine

## 2020-08-29 DIAGNOSIS — E1165 Type 2 diabetes mellitus with hyperglycemia: Secondary | ICD-10-CM

## 2020-09-01 ENCOUNTER — Telehealth: Payer: Self-pay | Admitting: Pharmacist

## 2020-09-01 NOTE — Chronic Care Management (AMB) (Signed)
    Chronic Care Management Pharmacy Assistant   Name: Janet Mccullough  MRN: [id number] DOB: February 22, 1950  Janet Mccullough is an 71 y.o. year old female who presents for his initial CCM visit with the clinical pharmacist.  Recent office visits:  08/24/20-Catherine D. Madilyn Fireman, MD (PCP) Diabetic follow up. Labs ordered. Follow up in 15 weeks. 06/01/20-Catherine D. Madilyn Fireman, MD (PCP) Seen for arm pain. Right shoulder pain-suspect bursitis.  Recommend ice, anti-inflammatory and range of motion exercises given on handout.  05/20/20-Catherine D. Madilyn Fireman, MD (PCP) Medicare annual wellness. Mammogram ordered. 05/19/20-Catherine D. Madilyn Fireman, MD (PCP) Diabetic follow up. Start with metformin 5 mg ER. Labs ordered. Follow up in 3 months.  Recent consult visits:  03/09/20-Brian Kenton [facility]) Notes not available.  Hospital visits:  None in previous 6 months  Medications: Outpatient Encounter Medications as of 09/01/2020  Medication Sig   AMBULATORY NON FORMULARY MEDICATION Take 1 tablet by mouth daily. Medication Name:  Berberine supplement   AMBULATORY NON FORMULARY MEDICATION Take 1 capsule by mouth in the morning and at bedtime. Medication Name: BONE BUILDING SUPPLEMENT   Ascorbic Acid (SUNKIST VITAMIN C PO) Take 500 mg by mouth daily.   Blood Glucose Monitoring Suppl (ONE TOUCH ULTRA SYSTEM KIT) W/DEVICE KIT 1 kit by Does not apply route once. Lancets, test strips for testing 3 times a week. Dx: IFG Dx code: R73.01   cholecalciferol (VITAMIN D) 1000 UNITS tablet Take 1,000 Units by mouth daily.   glucose blood (ONE TOUCH ULTRA TEST) test strip Dx: E11.9. Check fasting blood sugar daily.   Lancets (ONETOUCH DELICA PLUS [id number]) MISC USE 1  TO CHECK GLUCOSE IN THE MORNING   MAGNESIUM PO Take by mouth daily.   metFORMIN (GLUCOPHAGE-XR) 500 MG 24 hr tablet Take 1 tablet by mouth once daily with breakfast   Nutritional Supplements (DHEA) 15-50 MG CAPS Take 15 mg by mouth daily.   TURMERIC PO Take  by mouth.   No facility-administered encounter medications on file as of 09/01/2020.   Ascorbic Acid (SUNKIST VITAMIN C PO) Last filled:None noted Cholecalciferol (VITAMIN D) 1000 UNITS tablet Last filled:None noted MAGNESIUM PO Last filled:None noted MetFORMIN (GLUCOPHAGE-XR) 500 MG 24 hr tablet Last filled:08/18/20 90 DS TURMERIC PO Last filled:None  noted   Star Rating Drugs: MetFORMIN (GLUCOPHAGE-XR) 500 MG 24 hr tablet Last filled:08/18/20 90 DS   Myriam Elta [place], [place]

## 2020-09-08 ENCOUNTER — Other Ambulatory Visit: Payer: Self-pay

## 2020-09-08 ENCOUNTER — Ambulatory Visit (INDEPENDENT_AMBULATORY_CARE_PROVIDER_SITE_OTHER): Payer: Medicare HMO | Admitting: Pharmacist

## 2020-09-08 DIAGNOSIS — E1165 Type 2 diabetes mellitus with hyperglycemia: Secondary | ICD-10-CM | POA: Diagnosis not present

## 2020-09-08 DIAGNOSIS — M8580 Other specified disorders of bone density and structure, unspecified site: Secondary | ICD-10-CM

## 2020-09-08 NOTE — Progress Notes (Signed)
Chronic Care Management Pharmacy Note  09/08/2020 Name:  Janet Mccullough MRN:  [id number] DOB:  [birth year]  Summary: addressed DM, HLD, osteopenia. Patient prefers non-pharmacologic therapy & integrative medicine as primary approach. Discussed the options to increase metformin dosage (as tolerated) OR trying different medication (GLP1 or SGLT2) if a1c >7 at November PCP visit. Also discussed statin risks vs benefit extensively, goal LDL <70. May need to incorporate statin use if labs at November PCP visit still show LDL >70.  Recommendations/Changes made from today's visit: none  Plan: f/u with pharmacist in 4 months  Subjective: Janet Mccullough is an 70 y.o. year old female who is a primary patient of Metheney, Rene Kocher, MD.  The CCM team was consulted for assistance with disease management and care coordination needs.    Engaged with patient by telephone for initial visit in response to provider referral for pharmacy case management and/or care coordination services.   Consent to Services:  The patient was given information about Chronic Care Management services, agreed to services, and gave verbal consent prior to initiation of services.  Please see initial visit note for detailed documentation.   Patient Care Team: Hali Marry, MD as PCP - General (Family Medicine) Darius Bump, [facility] as Pharmacist (Pharmacist)  Recent office visits:  08/24/20-Catherine D. Madilyn Fireman, MD (PCP) Diabetic follow up. Labs ordered. Follow up in 15 weeks. 06/01/20-Catherine D. Madilyn Fireman, MD (PCP) Seen for arm pain. Right shoulder pain-suspect bursitis.  Recommend ice, anti-inflammatory and range of motion exercises given on handout.  05/20/20-Catherine D. Madilyn Fireman, MD (PCP) Medicare annual wellness. Mammogram ordered. 05/19/20-Catherine D. Madilyn Fireman, MD (PCP) Diabetic follow up. Start with metformin 5 mg ER. Labs ordered. Follow up in 3 months.   Recent consult visits:  03/09/20-Brian Kenton Kingfisher  [facility]) Notes not available.   Hospital visits:  None in previous 6 months  Objective:  Lab Results  Component Value Date   CREATININE 0.72 05/19/2020   CREATININE 0.75 11/07/2019   CREATININE 0.78 07/18/2019    Lab Results  Component Value Date   HGBA1C 7.8 (A) 08/24/2020   Last diabetic Eye exam:  Lab Results  Component Value Date/Time   HMDIABEYEEXA No Retinopathy 03/09/2020 12:00 AM    Last diabetic Foot exam: No results found for: HMDIABFOOTEX      Component Value Date/Time   CHOL 190 11/07/2019 1003   TRIG 95 11/07/2019 1003   HDL 64 11/07/2019 1003   CHOLHDL 3.0 11/07/2019 1003   VLDL 21 04/29/2015 0907   LDLCALC 107 (H) 11/07/2019 1003    Hepatic Function Latest Ref Rng & Units 11/07/2019 07/18/2019 09/20/2018  Total Protein 6.1 - 8.1 g/dL 7.2 6.9 7.1  Albumin 3.6 - 5.1 g/dL - - -  AST 10 - 35 U/L 9(L) 10 9(L)  ALT 6 - 29 U/L 11 18 14   Alk Phosphatase 33 - 130 U/L - - -  Total Bilirubin 0.2 - 1.2 mg/dL 0.7 0.5 0.7    Lab Results  Component Value Date/Time   TSH 2.60 07/29/2015 09:00 AM   TSH 2.567 05/13/2010 09:03 AM    CBC Latest Ref Rng & Units 07/18/2019 07/29/2015 07/29/2014  WBC 3.8 - 10.8 Thousand/uL 6.3 5.3 5.7  Hemoglobin 11.7 - 15.5 g/dL 13.4 14.0 14.4  Hematocrit 35.0 - 45.0 % 38.1 41.2 42.3  Platelets 140 - 400 Thousand/uL 320 271 277    Lab Results  Component Value Date/Time   VD25OH 32 07/29/2015 09:00 AM    Clinical ASCVD: No  The 10-year ASCVD  risk score Mikey Bussing DC Jr., et al., 2013) is: 13.7%   Values used to calculate the score:     Age: 40 years     Sex: Female     Is Non-Hispanic African American: No     Diabetic: Yes     Tobacco smoker: No     Systolic Blood Pressure: 553 mmHg     Is BP treated: No     HDL Cholesterol: 64 mg/dL     Total Cholesterol: 190 mg/dL    Other: (CHADS2VASc if Afib, PHQ9 if depression, MMRC or CAT for COPD, ACT, DEXA)  Social History   Tobacco Use  Smoking Status Never  Smokeless Tobacco  Never   BP Readings from Last 3 Encounters:  08/24/20 114/64  06/01/20 135/68  05/19/20 133/71   Pulse Readings from Last 3 Encounters:  08/24/20 79  06/01/20 92  05/19/20 76   Wt Readings from Last 3 Encounters:  08/24/20 182 lb (82.6 kg)  06/01/20 180 lb (81.6 kg)  05/19/20 180 lb (81.6 kg)    Assessment: Review of patient past medical history, allergies, medications, health status, including review of consultants reports, laboratory and other test data, was performed as part of comprehensive evaluation and provision of chronic care management services.   SDOH:  (Social Determinants of Health) assessments and interventions performed:    CCM Care Plan  Allergies  Allergen Reactions   Metformin And Related Other (See Comments)    Joint pain and constipation   Moxifloxacin Hcl In Nacl Hives   Penicillins Hives    REACTION: unknown rx   Quinolones Hives    In particular Avelox    Medications Reviewed Today     Reviewed by Darius Bump, [facility] (Pharmacist) on 09/08/20 at 1422  Med List Status: <None>   Medication Order Taking? Sig Documenting Provider Last Dose Status Informant  AMBULATORY NON FORMULARY MEDICATION [id number] Yes Take 1 tablet by mouth daily. Medication Name:  Berberine supplement [provider] Taking Active Self  AMBULATORY NON FORMULARY MEDICATION [id number] Yes Take 1 capsule by mouth in the morning and at bedtime. Medication Name: BONE BUILDING SUPPLEMENT [provider] Taking Active Self  B Complex Vitamins (B-COMPLEX/B-12 SL) [id number] Yes Place 1 tablet under the tongue daily. [provider] Taking Active   Blood Glucose Monitoring Suppl (ONE TOUCH ULTRA SYSTEM KIT) W/DEVICE KIT [id number] Yes 1 kit by Does not apply route once. Lancets, test strips for testing 3 times a week. Dx: IFG Dx code: E15.83 Hali Marry, MD Taking Active   Cholecalciferol (VITAMIN D3) 125 MCG (5000 UT) CAPS [id number] Yes Take 1 capsule by  mouth daily. 189mg (7500 units) per integrative medicine [provider] Taking Active   glucose blood (ONE TOUCH ULTRA TEST) test strip [id number] Dx: E11.9. Check fasting blood sugar daily. MHali Marry MD Taking Active   Lancets (ONETOUCH DELICA PLUS LVOPFYT24M [id number] USE 1  TO CHECK GLUCOSE IN THE MORNING MHali Marry MD Taking Active   MAGNESIUM PO [id number] Take 1,350 mg by mouth daily. Mg Maleate (3 capsule daily) [provider] Taking Active   metFORMIN (GLUCOPHAGE-XR) 500 MG 24 hr tablet [id number] Take 1 tablet by mouth once daily with breakfast MHali Marry MD Taking Active   Misc Natural Products ([place] CAPS [id number] Take 1 capsule by mouth daily. 7540mcapsule [provider] Taking Active   Nutritional Supplements (DHEA) 15-50 MG CAPS [id number] Take 15 mg  by mouth daily. [provider] Taking Active   Vitamin Mixture (VITAMIN C) LIQD [id number] Yes Take 1,000 mg by mouth daily. [provider] Taking Active             Patient Active Problem List   Diagnosis Date Noted   History of COVID-19 02/20/2020   Microalbuminuria due to type 2 diabetes mellitus ([place]) 07/10/2018   Ductal carcinoma ([place]) 12/05/2016   Coccydynia 09/21/2016   Osteopenia 10/29/2013   VASOMOTOR RHINITIS 06/16/2008   HAY FEVER 04/14/2008   HEMATURIA 04/24/2006   Uncontrolled diabetes mellitus ([place]) 03/22/2006   History of breast cancer 10/18/2005   MENOPAUSAL SYNDROME 10/18/2005    Immunization History  Administered Date(s) Administered   Td 01/10/2002    Conditions to be addressed/monitored: HLD and DMII  Care Plan : Medication Management  Updates made by Darius Bump, [place] since 09/08/2020 12:00 AM     Problem: DM, HLD      Long-Range Goal: Disease Progression Prevention   Start Date: 09/08/2020  This Visit's Progress: On track  Priority: High  Note:   Current  Barriers:  None at present   Pharmacist Clinical Goal(s):  Over the next 120 days, patient will achieve control of chronic conditions as evidenced by lab values, vital signs, and medication fill history through collaboration with PharmD and provider.   Interventions: 1:1 collaboration with Hali Marry, MD regarding development and update of comprehensive plan of care as evidenced by provider attestation and co-signature Inter-disciplinary care team collaboration (see longitudinal plan of care) Comprehensive medication review performed; medication list updated in electronic medical record  Diabetes:  Uncontrolled, current treatment:metformin XR 558m daily; a1c 7.8  Current glucose readings: fasting glucose: 130-140s, post prandial glucose: 150-170s   Denies hypoglycemic/hyperglycemic symptoms  Current meal patterns: breakfast: oatmeal, bacon/eggs/avocado/tomato, whole wheat cereal; lunch: salads, wraps, baked foods, roasted veggies, soup; dinner: similar to lunch; snacks: not a big snacker, sometimes fruit, kind bar, nuts, popcorn, baked potato chips; drinks: coffee or tea in AM, water +lemon, iced tea unsweet + stevia  Current exercise: walking, stretches, resistance bands/hand weights  Counseled on BG & A1c goals, lifestyle management via diet/exercise Recommended continue current regimen, patient prefers non-pharmacologic therapy & integrative medicine as primary approach. Discussed the options to increase metformin dosage (as tolerated) OR trying different medication (GLP1 or SGLT2) if a1c >7 at November PCP visit.   and  Hyperlipidemia:  Uncontrolled; current treatment:lifestyle modifications only;   Medications previously tried: none yet   Counseled on risk vs benefit of statin medications Recommended continue current regimen. If LDL >70 at November PCP visit, consider rosuvastatin 521mdaily.  Osteopenia:  Controlled; supplementing Vit D+ multiple bone support supplements   Recommended continue current regimen.  Patient Goals/Self-Care Activities Over the next 120 days, patient will:  take medications as prescribed, target a minimum of 150 minutes of moderate intensity exercise weekly, and engage in dietary modifications by opting for lean meats + veggies, and consuming carbs/sugar in moderation.  Follow Up Plan: Telephone follow up appointment with care management team member scheduled for:  4 months      Medication Assistance: None required.  Patient affirms current coverage meets needs.  [place] [id number] [id number]: [phone]: [phone] pill box? Yes Pt endorses 100% compliance  Follow Up:  Patient agrees to Care Plan and Follow-up.  Plan: Telephone follow  up appointment with care management team member scheduled for:  4 months  Darius Bump

## 2020-09-08 NOTE — Patient Instructions (Signed)
Visit Information   PATIENT GOALS:   Goals Addressed             This Visit's Progress    Medication Management       Patient Goals/Self-Care Activities Over the next 120 days, patient will:  take medications as prescribed, target a minimum of 150 minutes of moderate intensity exercise weekly, and engage in dietary modifications by opting for lean meats + veggies, and consuming carbs/sugar in moderation.  Follow Up Plan: Telephone follow up appointment with care management team member scheduled for:  4 months         Consent to CCM Services: Janet Mccullough was given information about Chronic Care Management services including:  CCM service includes personalized support from designated clinical staff supervised by her physician, including individualized plan of care and coordination with other care providers 24/7 contact phone numbers for assistance for urgent and routine care needs. Service will only be billed when office clinical staff spend 20 minutes or more in a month to coordinate care. Only one practitioner may furnish and bill the service in a calendar month. The patient may stop CCM services at any time (effective at the end of the month) by phone call to the office staff. The patient will be responsible for cost sharing (co-pay) of up to 20% of the service fee (after annual deductible is met).  Patient agreed to services and verbal consent obtained.   Patient verbalizes understanding of instructions provided today and agrees to view in [place].   Telephone follow up appointment with care management team member scheduled for: 4 months  Edith Endave: Patient Care Plan: Medication Management     Problem Identified: DM, HLD      Long-Range Goal: Disease Progression Prevention   Start Date: 09/08/2020  This Visit's Progress: On track  Priority: High  Note:   Current Barriers:  None at present   Pharmacist Clinical Goal(s):  Over the next  120 days, patient will achieve control of chronic conditions as evidenced by lab values, vital signs, and medication fill history through collaboration with PharmD and provider.   Interventions: 1:1 collaboration with Hali Marry, MD regarding development and update of comprehensive plan of care as evidenced by provider attestation and co-signature Inter-disciplinary care team collaboration (see longitudinal plan of care) Comprehensive medication review performed; medication list updated in electronic medical record  Diabetes:  Uncontrolled, current treatment:metformin XR 500mg  daily; a1c 7.8  Current glucose readings: fasting glucose: 130-140s, post prandial glucose: 150-170s   Denies hypoglycemic/hyperglycemic symptoms  Current meal patterns: breakfast: oatmeal, bacon/eggs/avocado/tomato, whole wheat cereal; lunch: salads, wraps, baked foods, roasted veggies, soup; dinner: similar to lunch; snacks: not a big snacker, sometimes fruit, kind bar, nuts, popcorn, baked potato chips; drinks: coffee or tea in AM, water +lemon, iced tea unsweet + stevia  Current exercise: walking, stretches, resistance bands/hand weights  Counseled on BG & A1c goals, lifestyle management via diet/exercise Recommended continue current regimen, patient prefers non-pharmacologic therapy & integrative medicine as primary approach. Discussed the options to increase metformin dosage (as tolerated) OR trying different medication (GLP1 or SGLT2) if a1c >7 at November PCP visit.   and  Hyperlipidemia:  Uncontrolled; current treatment:lifestyle modifications only;   Medications previously tried: none yet   Counseled on risk vs benefit of statin medications Recommended continue current regimen. If LDL >70 at November PCP visit, consider rosuvastatin 5mg  daily.   Patient Goals/Self-Care Activities Over the next 120 days, patient will:  take medications as prescribed, target a minimum of 150 minutes of moderate  intensity exercise weekly, and engage in dietary modifications by opting for lean meats + veggies, and consuming carbs/sugar in moderation.  Follow Up Plan: Telephone follow up appointment with care management team member scheduled for:  4 months

## 2020-11-18 ENCOUNTER — Other Ambulatory Visit: Payer: Self-pay

## 2020-11-18 ENCOUNTER — Ambulatory Visit (INDEPENDENT_AMBULATORY_CARE_PROVIDER_SITE_OTHER): Payer: Medicare HMO

## 2020-11-18 DIAGNOSIS — Z0000 Encounter for general adult medical examination without abnormal findings: Secondary | ICD-10-CM

## 2020-11-18 DIAGNOSIS — Z1231 Encounter for screening mammogram for malignant neoplasm of breast: Secondary | ICD-10-CM

## 2020-11-19 NOTE — Progress Notes (Signed)
Please call patient. Normal mammogram.  Repeat in 1 year.  

## 2020-11-27 ENCOUNTER — Other Ambulatory Visit: Payer: Self-pay | Admitting: Family Medicine

## 2020-11-27 DIAGNOSIS — E1165 Type 2 diabetes mellitus with hyperglycemia: Secondary | ICD-10-CM

## 2020-12-01 DIAGNOSIS — Z9189 Other specified personal risk factors, not elsewhere classified: Secondary | ICD-10-CM

## 2020-12-01 NOTE — Progress Notes (Signed)
[name])                                            [place] Team                                        Statin Quality Measure Assessment    12/01/2020  Janet Mccullough Oct 12, 1950 [id number]  Per review of chart and payor information, this patient has been flagged for non-adherence to the following CMS Quality Measure:   [x]  Statin Use in Persons with Diabetes  []  Statin Use in Persons with Cardiovascular Disease  The 10-year ASCVD risk score (Arnett DK, et al., 2019) is: 13.7%   Values used to calculate the score:     Age: 70 years     Sex: Female     Is Non-Hispanic African American: No     Diabetic: Yes     Tobacco smoker: No     Systolic Blood Pressure: 132 mmHg     Is BP treated: No     HDL Cholesterol: 64 mg/dL     Total Cholesterol: 190 mg/dL    LDL 107 mg/dL  as of 11/07/2019  No h/o of statin intolerance, but patient has declined in the past. If deemed clinically appropriate, please consider statin initiation at the next O/v on 12/07/2020/  Please consider ONE of the following recommendations:   Initiate high intensity statin Atorvastatin 40mg  once daily, #90, 3 refills   Rosuvastatin 20mg  once daily, #90, 3 refills    Initiate moderate intensity          statin with reduced frequency if prior          statin intolerance 1x weekly, #13, 3 refills   2x weekly, #26, 3 refills   3x weekly, #39, 3 refills    Code for past statin intolerance or other exclusions (required annually)  Drug Induced Myopathy G72.0   Myositis, unspecified M60.9   Rhabdomyolysis M62.82   Prediabetes R73.03   Adverse effect of antihyperlipidemic and antiarteriosclerotic drugs, initial encounter G40.1U2V    Thank you for your time,  Kristeen Miss, [place] Cell: [phone]

## 2020-12-07 ENCOUNTER — Encounter: Payer: Self-pay | Admitting: Family Medicine

## 2020-12-07 ENCOUNTER — Other Ambulatory Visit: Payer: Self-pay

## 2020-12-07 ENCOUNTER — Ambulatory Visit (INDEPENDENT_AMBULATORY_CARE_PROVIDER_SITE_OTHER): Payer: Medicare HMO | Admitting: Family Medicine

## 2020-12-07 VITALS — BP 118/56 | HR 76 | Ht 67.0 in | Wt 183.0 lb

## 2020-12-07 DIAGNOSIS — E1129 Type 2 diabetes mellitus with other diabetic kidney complication: Secondary | ICD-10-CM | POA: Diagnosis not present

## 2020-12-07 DIAGNOSIS — R809 Proteinuria, unspecified: Secondary | ICD-10-CM

## 2020-12-07 DIAGNOSIS — E1165 Type 2 diabetes mellitus with hyperglycemia: Secondary | ICD-10-CM

## 2020-12-07 DIAGNOSIS — H6982 Other specified disorders of Eustachian tube, left ear: Secondary | ICD-10-CM

## 2020-12-07 LAB — CBC
HCT: 42.1 % (ref 35.0–45.0)
Hemoglobin: 14.4 g/dL (ref 11.7–15.5)
MCH: 31.2 pg (ref 27.0–33.0)
MCHC: 34.2 g/dL (ref 32.0–36.0)
MCV: 91.3 fL (ref 80.0–100.0)
MPV: 11.8 fL (ref 7.5–12.5)
Platelets: 260 10*3/uL (ref 140–400)
RBC: 4.61 10*6/uL (ref 3.80–5.10)
RDW: 12.7 % (ref 11.0–15.0)
WBC: 5.9 10*3/uL (ref 3.8–10.8)

## 2020-12-07 LAB — COMPLETE METABOLIC PANEL WITH GFR
AG Ratio: 1.7 (calc) (ref 1.0–2.5)
ALT: 18 U/L (ref 6–29)
AST: 11 U/L (ref 10–35)
Albumin: 4.6 g/dL (ref 3.6–5.1)
Alkaline phosphatase (APISO): 67 U/L (ref 37–153)
BUN: 18 mg/dL (ref 7–25)
CO2: 27 mmol/L (ref 20–32)
Calcium: 9.5 mg/dL (ref 8.6–10.4)
Chloride: 104 mmol/L (ref 98–110)
Creat: 0.78 mg/dL (ref 0.60–1.00)
Globulin: 2.7 g/dL (calc) (ref 1.9–3.7)
Glucose, Bld: 213 mg/dL — ABNORMAL HIGH (ref 65–99)
Potassium: 4.6 mmol/L (ref 3.5–5.3)
Sodium: 139 mmol/L (ref 135–146)
Total Bilirubin: 0.7 mg/dL (ref 0.2–1.2)
Total Protein: 7.3 g/dL (ref 6.1–8.1)
eGFR: 82 mL/min/{1.73_m2} (ref >=60)

## 2020-12-07 LAB — POCT GLYCOSYLATED HEMOGLOBIN (HGB A1C): Hemoglobin A1C: 8.1 % — AB (ref 4.0–5.6)

## 2020-12-07 LAB — LIPID PANEL W/REFLEX DIRECT LDL
Cholesterol: 196 mg/dL (ref <200)
HDL: 68 mg/dL (ref >=50)
LDL Cholesterol (Calc): 109 mg/dL (calc) — ABNORMAL HIGH
Non-HDL Cholesterol (Calc): 128 mg/dL (calc) (ref <130)
Total CHOL/HDL Ratio: 2.9 (calc) (ref <5.0)
Triglycerides: 101 mg/dL (ref <150)

## 2020-12-07 MED ORDER — METFORMIN HCL ER 500 MG PO TB24: 1000.0000 mg | ORAL_TABLET | Freq: Every day | ORAL | 1 refills | Status: DC

## 2020-12-07 NOTE — Assessment & Plan Note (Signed)
A1c is still uncontrolled.  In fact it went up from 7.8-8.1.  She has done well with the metformin so far so says she would be okay with trying to go up on the dose 2000 mg.  We will send over new prescription to the pharmacy and follow-up in 3 months.  She would also be a great candidate for a GLP-1.  Declined statin again.

## 2020-12-07 NOTE — Progress Notes (Signed)
Hi Janet Mccullough, your LDL cholesterol is just borderline.  Continue to work on healthy diet and regular exercise.  Glucose was over 200 this morning.  All additional labs were normal.

## 2020-12-07 NOTE — Progress Notes (Signed)
Established Patient Office Visit  Subjective:  Patient ID: Janet Mccullough, female    DOB: 1950-06-26  Age: 70 y.o. MRN: [id number]  CC:  Chief Complaint  Patient presents with   Diabetes    HPI Janet Mccullough presents for   Diabetes - no hypoglycemic events. No wounds or sores that are not healing well. No increased thirst or urination. Checking glucose at home. Taking medications as prescribed without any side effects.  Does feel like her blood sugars were initially quite elevated but feels like they have actually gotten a little better lately.  She felt like they went up after she recently had COVID.  She is still taking to an intermittent fasting diet.  She is still not interested in taking a cholesterol-lowering medication.  She currently takes DHEA.  But says she plans on stopping soon.  Would also like me to look in her left ear today she says it is just felt like its been popping on and off again since she had COVID.  She did try taking some Claritin for a couple of weeks and that did seem to help but now that she has been back off of it it feels like it starting to pop and feel full again.  Past Medical History:  Diagnosis Date   Cancer ([place])    breast   Impaired fasting glucose    Microscopic hematuria    renal ultrasound normal (Dr Diona Fanti)   Normal echocardiogram    EF 60-65%    Past Surgical History:  Procedure Laterality Date   ABDOMINAL HYSTERECTOMY  07/2001   for DUB/ cysts   MASTECTOMY Left    uterine cyst removed  05/1997    Family History  Problem Relation Age of Onset   Diabetes Father    Heart disease Father    Diabetes Brother    Diabetes Brother        type 2       Outpatient Medications Prior to Visit  Medication Sig Dispense Refill   AMBULATORY NON FORMULARY MEDICATION Take 1 tablet by mouth daily. Medication Name:  Berberine supplement     AMBULATORY NON FORMULARY MEDICATION Take 1 capsule by mouth in the morning and at bedtime.  Medication Name: BONE BUILDING SUPPLEMENT     B Complex Vitamins (B-COMPLEX/B-12 SL) Place 1 tablet under the tongue daily.     Blood Glucose Monitoring Suppl (ONE TOUCH ULTRA SYSTEM KIT) W/DEVICE KIT 1 kit by Does not apply route once. Lancets, test strips for testing 3 times a week. Dx: IFG Dx code: R73.01 1 each 0   Cholecalciferol (VITAMIN D3) 125 MCG (5000 UT) CAPS Take 1 capsule by mouth daily. 153mg (7500 units) per integrative medicine     glucose blood (ONE TOUCH ULTRA TEST) test strip Dx: E11.9. Check fasting blood sugar daily. 100 each 11   Lancets (ONETOUCH DELICA PLUS LIDCVUD31Y MISC USE 1  TO CHECK GLUCOSE IN THE MORNING 100 each prn   MAGNESIUM PO Take 1,350 mg by mouth daily. Mg Maleate (3 capsule daily)     Misc Natural Products (TURMERIC CURCUMIN) CAPS Take 1 capsule by mouth daily. 7560mcapsule     Vitamin Mixture (VITAMIN C) LIQD Take 1,000 mg by mouth daily.     metFORMIN (GLUCOPHAGE-XR) 500 MG 24 hr tablet Take 1 tablet by mouth once daily with breakfast 90 tablet 0   Nutritional Supplements (DHEA) 15-50 MG CAPS Take 15 mg by mouth daily.     No facility-administered medications prior  to visit.    Allergies  Allergen Reactions   Metformin And Related Other (See Comments)    Joint pain and constipation   Moxifloxacin Hcl In Nacl Hives   Penicillins Hives    REACTION: unknown rx   Quinolones Hives    In particular Avelox    ROS Review of Systems    Objective:    Physical Exam Constitutional:      Appearance: Normal appearance. She is well-developed.  HENT:     Head: Normocephalic and atraumatic.  Cardiovascular:     Rate and Rhythm: Normal rate and regular rhythm.     Heart sounds: Normal heart sounds.  Pulmonary:     Effort: Pulmonary effort is normal.     Breath sounds: Normal breath sounds.  Skin:    General: Skin is warm and dry.  Neurological:     Mental Status: She is alert and oriented to person, place, and time.  Psychiatric:         Behavior: Behavior normal.    BP (!) 118/56   Pulse 76   Ht _0  (1.702 m)   Wt 183 lb (83 kg)   SpO2 98%   BMI 28.66 kg/m  Wt Readings from Last 3 Encounters:  12/07/20 183 lb (83 kg)  08/24/20 182 lb (82.6 kg)  06/01/20 180 lb (81.6 kg)     Health Maintenance Due  Topic Date Due   Pneumonia Vaccine 45+ Years old (1 - PCV) Never done   Zoster Vaccines- Shingrix (1 of 2) Never done    There are no preventive care reminders to display for this patient.  Lab Results  Component Value Date   TSH 2.60 07/29/2015   Lab Results  Component Value Date   WBC 5.9 12/07/2020   HGB 14.4 12/07/2020   HCT 42.1 12/07/2020   MCV 91.3 12/07/2020   PLT 260 12/07/2020   Lab Results  Component Value Date   NA 139 12/07/2020   K 4.6 12/07/2020   CO2 27 12/07/2020   GLUCOSE 213 (H) 12/07/2020   BUN 18 12/07/2020   CREATININE 0.78 12/07/2020   BILITOT 0.7 12/07/2020   ALKPHOS 54 05/05/2016   AST 11 12/07/2020   ALT 18 12/07/2020   PROT 7.3 12/07/2020   ALBUMIN 4.3 05/05/2016   CALCIUM 9.5 12/07/2020   EGFR 82 12/07/2020   Lab Results  Component Value Date   CHOL 196 12/07/2020   Lab Results  Component Value Date   HDL 68 12/07/2020   Lab Results  Component Value Date   LDLCALC 109 (H) 12/07/2020   Lab Results  Component Value Date   TRIG 101 12/07/2020   Lab Results  Component Value Date   CHOLHDL 2.9 12/07/2020   Lab Results  Component Value Date   HGBA1C 7.8 (A) 08/24/2020      Assessment & Plan:   Problem List Items Addressed This Visit       Endocrine   Uncontrolled diabetes mellitus - Primary    A1c is still uncontrolled.  In fact it went up from 7.8-8.1.  She has done well with the metformin so far so says she would be okay with trying to go up on the dose 2000 mg.  We will send over new prescription to the pharmacy and follow-up in 3 months.  She would also be a great candidate for a GLP-1.  Declined statin again.      Relevant Medications    metFORMIN (GLUCOPHAGE-XR) 500 MG 24 hr  tablet   Other Relevant Orders   POCT glycosylated hemoglobin (Hb A1C)   Lipid Panel w/reflex Direct LDL (Completed)   COMPLETE METABOLIC PANEL WITH GFR (Completed)   CBC (Completed)   Microalbuminuria due to type 2 diabetes mellitus (HCC)   Relevant Medications   metFORMIN (GLUCOPHAGE-XR) 500 MG 24 hr tablet   Other Visit Diagnoses     Acute dysfunction of left eustachian tube           Eustachian tube dysfunction-discussed maybe retrying antihistamine again since it did seem to help.  May need to take it for just a little bit longer.  Other option would be to consider a round of prednisone.  Meds ordered this encounter  Medications   metFORMIN (GLUCOPHAGE-XR) 500 MG 24 hr tablet    Sig: Take 2 tablets (1,000 mg total) by mouth daily with breakfast.    Dispense:  180 tablet    Refill:  1    Follow-up: Return in about 3 months (around 03/09/2021) for Diabetes follow-up.    Beatrice Lecher, MD

## 2021-01-21 ENCOUNTER — Other Ambulatory Visit: Payer: Self-pay

## 2021-01-21 ENCOUNTER — Ambulatory Visit (INDEPENDENT_AMBULATORY_CARE_PROVIDER_SITE_OTHER): Payer: Medicare HMO | Admitting: Pharmacist

## 2021-01-21 DIAGNOSIS — M8580 Other specified disorders of bone density and structure, unspecified site: Secondary | ICD-10-CM

## 2021-01-21 DIAGNOSIS — E1165 Type 2 diabetes mellitus with hyperglycemia: Secondary | ICD-10-CM

## 2021-01-21 NOTE — Progress Notes (Signed)
Chronic Care Management Pharmacy Note  01/22/2021 Name:  Janet Mccullough MRN:  [id number] DOB:  [birth year]  Summary: addressed DM, HLD, osteopenia. Patient prefers non-pharmacologic therapy & integrative medicine as primary approach. PCP increased metformin.   Also discussed statin risks vs benefit extensively, goal LDL <70. May need to incorporate statin use, patient declines at this time.  Patient reported BG: 140-170s fasting, 180-190s postprandial  Recommendations/Changes made from today's visit: none,   Provided discussion and counseling regarding GLP1, highly recommend ozempic or rybelsus at next PCP visit if A1c >7. Patient expressed understanding.  Plan: f/u with pharmacist in 5 months  Subjective: Janet Mccullough is an 71 y.o. year old female who is a primary patient of Metheney, Rene Kocher, MD.  The CCM team was consulted for assistance with disease management and care coordination needs.    Engaged with patient by telephone for follow up visit in response to provider referral for pharmacy case management and/or care coordination services.   Consent to Services:  The patient was given information about Chronic Care Management services, agreed to services, and gave verbal consent prior to initiation of services.  Please see initial visit note for detailed documentation.   Patient Care Team: Hali Marry, MD as PCP - General (Family Medicine) Darius Bump, [facility] as Pharmacist (Pharmacist)  Recent office visits:  08/24/20-Catherine D. Madilyn Fireman, MD (PCP) Diabetic follow up. Labs ordered. Follow up in 15 weeks. 06/01/20-Catherine D. Madilyn Fireman, MD (PCP) Seen for arm pain. Right shoulder pain-suspect bursitis.  Recommend ice, anti-inflammatory and range of motion exercises given on handout.  05/20/20-Catherine D. Madilyn Fireman, MD (PCP) Medicare annual wellness. Mammogram ordered. 05/19/20-Catherine D. Madilyn Fireman, MD (PCP) Diabetic follow up. Start with metformin 5 mg ER. Labs  ordered. Follow up in 3 months.   Recent consult visits:  03/09/20-Brian Kenton [facility]) Notes not available.   Hospital visits:  None in previous 6 months  Objective:  Lab Results  Component Value Date   CREATININE 0.78 12/07/2020   CREATININE 0.72 05/19/2020   CREATININE 0.75 11/07/2019    Lab Results  Component Value Date   HGBA1C 8.1 (A) 12/07/2020   Last diabetic Eye exam:  Lab Results  Component Value Date/Time   HMDIABEYEEXA No Retinopathy 03/09/2020 12:00 AM    Last diabetic Foot exam: No results found for: HMDIABFOOTEX      Component Value Date/Time   CHOL 196 12/07/2020 1033   TRIG 101 12/07/2020 1033   HDL 68 12/07/2020 1033   CHOLHDL 2.9 12/07/2020 1033   VLDL 21 04/29/2015 0907   LDLCALC 109 (H) 12/07/2020 1033    Hepatic Function Latest Ref Rng & Units 12/07/2020 11/07/2019 07/18/2019  Total Protein 6.1 - 8.1 g/dL 7.3 7.2 6.9  Albumin 3.6 - 5.1 g/dL - - -  AST 10 - 35 U/L 11 9(L) 10  ALT 6 - 29 U/L _0 Alk Phosphatase 33 - 130 U/L - - -  Total Bilirubin 0.2 - 1.2 mg/dL 0.7 0.7 0.5    Lab Results  Component Value Date/Time   TSH 2.60 07/29/2015 09:00 AM   TSH 2.567 05/13/2010 09:03 AM    CBC Latest Ref Rng & Units 12/07/2020 07/18/2019 07/29/2015  WBC 3.8 - 10.8 Thousand/uL 5.9 6.3 5.3  Hemoglobin 11.7 - 15.5 g/dL 14.4 13.4 14.0  Hematocrit 35.0 - 45.0 % 42.1 38.1 41.2  Platelets 140 - 400 Thousand/uL 260 320 271    Lab Results  Component Value Date/Time   VD25OH 32 07/29/2015 09:00 AM  Clinical ASCVD: No  The 10-year ASCVD risk score (Arnett DK, et al., 2019) is: 14.5%   Values used to calculate the score:     Age: 71 years     Sex: Female     Is Non-Hispanic African American: No     Diabetic: Yes     Tobacco smoker: No     Systolic Blood Pressure: 161 mmHg     Is BP treated: No     HDL Cholesterol: 68 mg/dL     Total Cholesterol: 196 mg/dL    Other: (CHADS2VASc if Afib, PHQ9 if depression, MMRC or CAT for COPD, ACT,  DEXA)  Social History   Tobacco Use  Smoking Status Never  Smokeless Tobacco Never   BP Readings from Last 3 Encounters:  12/07/20 (!) 118/56  08/24/20 114/64  06/01/20 135/68   Pulse Readings from Last 3 Encounters:  12/07/20 76  08/24/20 79  06/01/20 92   Wt Readings from Last 3 Encounters:  12/07/20 183 lb (83 kg)  08/24/20 182 lb (82.6 kg)  06/01/20 180 lb (81.6 kg)    Assessment: Review of patient past medical history, allergies, medications, health status, including review of consultants reports, laboratory and other test data, was performed as part of comprehensive evaluation and provision of chronic care management services.   SDOH:  (Social Determinants of Health) assessments and interventions performed:    CCM Care Plan  Allergies  Allergen Reactions   Metformin And Related Other (See Comments)    Joint pain and constipation   Moxifloxacin Hcl In Nacl Hives   Penicillins Hives    REACTION: unknown rx   Quinolones Hives    In particular Avelox    Medications Reviewed Today     Reviewed by Darius Bump, [place] (Pharmacist) on 01/21/21 at 1318  Med List Status: <None>   Medication Order Taking? Sig Documenting Provider Last Dose Status Informant  AMBULATORY NON FORMULARY MEDICATION [id number] Yes Take 1 tablet by mouth daily. Medication Name:  Berberine supplement [provider] Taking Active Self  AMBULATORY NON FORMULARY MEDICATION [id number] Yes Take 1 capsule by mouth in the morning and at bedtime. Medication Name: BONE BUILDING SUPPLEMENT [provider] Taking Active Self  B Complex Vitamins (B-COMPLEX/B-12 SL) [id number] Yes Place 1 tablet under the tongue daily. [provider] Taking Active   Blood Glucose Monitoring Suppl (ONE TOUCH ULTRA SYSTEM KIT) W/DEVICE KIT [id number] Yes 1 kit by Does not apply route once. Lancets, test strips for testing 3 times a week. Dx: IFG Dx code: B28.41 Hali Marry, MD Taking Active    Cholecalciferol (VITAMIN D3) 125 MCG (5000 UT) CAPS [id number] Yes Take 1 capsule by mouth daily. 122mg (7500 units) per integrative medicine [provider] Taking Active   glucose blood (ONE TOUCH ULTRA TEST) test strip [id number] Dx: E11.9. Check fasting blood sugar daily. MHali Marry MD Taking Active   Lancets (ONETOUCH DELICA PLUS [id number] [id number] USE 1  TO CHECK GLUCOSE IN THE MORNING MHali Marry MD Taking Active   MAGNESIUM PO [id number] Take 1,350 mg by mouth daily. Mg Maleate (3 capsule daily) [provider] Taking Active   metFORMIN (GLUCOPHAGE-XR) 500 MG 24 hr tablet [id number] Take 2 tablets (1,000 mg total) by mouth daily with breakfast. MHali Marry MD Taking Active   Misc Natural Products (TBrian Head CAPS [id number] Take 1 capsule by mouth daily. 7549mcapsule [provider] Taking Active   Vitamin Mixture (  VITAMIN C) LIQD [id number] Yes Take 1,000 mg by mouth daily. [provider] Taking Active             Patient Active Problem List   Diagnosis Date Noted   History of COVID-19 02/20/2020   Microalbuminuria due to type 2 diabetes mellitus ([place]) 07/10/2018   Ductal carcinoma ([place]) 12/05/2016   Coccydynia 09/21/2016   Osteopenia 10/29/2013   VASOMOTOR RHINITIS 06/16/2008   HAY FEVER 04/14/2008   HEMATURIA 04/24/2006   Uncontrolled diabetes mellitus 03/22/2006   History of breast cancer 10/18/2005   MENOPAUSAL SYNDROME 10/18/2005    Immunization History  Administered Date(s) Administered   Td 01/10/2002    Conditions to be addressed/monitored: HLD and DMII  Care Plan : Medication Management  Updates made by Darius Bump, [place] since 01/22/2021 12:00 AM     Problem: DM, HLD      Long-Range Goal: Disease Progression Prevention   Start Date: 09/08/2020  Recent Progress: On track  Priority: High  Note:   Current Barriers:  None at present   Pharmacist  Clinical Goal(s):  Over the next 120 days, patient will achieve control of chronic conditions as evidenced by lab values, vital signs, and medication fill history through collaboration with PharmD and provider.   Interventions: 1:1 collaboration with Hali Marry, MD regarding development and update of comprehensive plan of care as evidenced by provider attestation and co-signature Inter-disciplinary care team collaboration (see longitudinal plan of care) Comprehensive medication review performed; medication list updated in electronic medical record  Diabetes:  Uncontrolled, current treatment:metformin XR 1072m daily; a1c 8.1  Current glucose readings: fasting glucose: 130-140s, post prandial glucose: 150-170s   Denies hypoglycemic/hyperglycemic symptoms  Current meal patterns: breakfast: oatmeal, bacon/eggs/avocado/tomato, whole wheat cereal; lunch: salads, wraps, baked foods, roasted veggies, soup; dinner: similar to lunch; snacks: not a big snacker, sometimes fruit, kind bar, nuts, popcorn, baked potato chips; drinks: coffee or tea in AM, water +lemon, iced tea unsweet + stevia  Current exercise: walking, stretches, resistance bands/hand weights  Counseled on BG & A1c goals, lifestyle management via diet/exercise Recommended continue current regimen, patient prefers non-pharmacologic therapy & integrative medicine as primary approach. Discussed the need for ozempic or rybelsus if a1c >7 at next PCP visit.    Hyperlipidemia:  Uncontrolled; current treatment:lifestyle modifications only;   Medications previously tried: none yet   Counseled on risk vs benefit of statin medications Recommended continue current regimen. Consider rosuvastatin 551mdaily, pt declines at this time. Osteopenia:  Controlled; supplementing Vit D+ multiple bone support supplements  Recommended continue current regimen.  Patient Goals/Self-Care Activities Over the next 120 days, patient will:  take  medications as prescribed, target a minimum of 150 minutes of moderate intensity exercise weekly, and engage in dietary modifications by opting for lean meats + veggies, and consuming carbs/sugar in moderation.  Follow Up Plan: Telephone follow up appointment with care management team member scheduled for:  5 months       Medication Assistance: None required.  Patient affirms current coverage meets needs.  [facility] [address] [place] [place]one: [phone]: [phone] Uses pill box? Yes Pt endorses 100% compliance  Follow Up:  Patient agrees to Care Plan and Follow-up.  Plan: Telephone follow up appointment with care management team member scheduled for:  5 months  KeLarinda ButteryPharmD Clinical Pharmacist [facility]  [phone]

## 2021-01-22 NOTE — Patient Instructions (Signed)
Visit Information  Thank you for taking time to visit with me today. Please don't hesitate to contact me if I can be of assistance to you before our next scheduled telephone appointment.  Following are the goals we discussed today:  Patient Goals/Self-Care Activities Over the next 120 days, patient will:  take medications as prescribed, target a minimum of 150 minutes of moderate intensity exercise weekly, and engage in dietary modifications by opting for lean meats + veggies, and consuming carbs/sugar in moderation.  Follow Up Plan: Telephone follow up appointment with care management team member scheduled for:  5 months   Please call the care guide team at [phone] if you need to cancel or reschedule your appointment.    Patient verbalizes understanding of instructions and care plan provided today and agrees to view in [place]. Active MyChart status confirmed with patient.    Darius Bump

## 2021-02-09 DIAGNOSIS — E1169 Type 2 diabetes mellitus with other specified complication: Secondary | ICD-10-CM

## 2021-02-09 DIAGNOSIS — Z7984 Long term (current) use of oral hypoglycemic drugs: Secondary | ICD-10-CM

## 2021-02-09 DIAGNOSIS — E785 Hyperlipidemia, unspecified: Secondary | ICD-10-CM

## 2021-03-10 ENCOUNTER — Other Ambulatory Visit: Payer: Self-pay

## 2021-03-10 ENCOUNTER — Ambulatory Visit (INDEPENDENT_AMBULATORY_CARE_PROVIDER_SITE_OTHER): Payer: Medicare HMO | Admitting: Family Medicine

## 2021-03-10 ENCOUNTER — Encounter: Payer: Self-pay | Admitting: Family Medicine

## 2021-03-10 VITALS — BP 123/70 | HR 77 | Resp 16 | Ht 67.0 in | Wt 183.0 lb

## 2021-03-10 DIAGNOSIS — R809 Proteinuria, unspecified: Secondary | ICD-10-CM

## 2021-03-10 DIAGNOSIS — E1129 Type 2 diabetes mellitus with other diabetic kidney complication: Secondary | ICD-10-CM

## 2021-03-10 DIAGNOSIS — E1165 Type 2 diabetes mellitus with hyperglycemia: Secondary | ICD-10-CM | POA: Diagnosis not present

## 2021-03-10 DIAGNOSIS — E1169 Type 2 diabetes mellitus with other specified complication: Secondary | ICD-10-CM | POA: Diagnosis not present

## 2021-03-10 LAB — POCT GLYCOSYLATED HEMOGLOBIN (HGB A1C): Hemoglobin A1C: 7.4 % — AB (ref 4.0–5.6)

## 2021-03-10 LAB — POCT UA - MICROALBUMIN

## 2021-03-10 MED ORDER — METFORMIN HCL ER 500 MG PO TB24: 1000.0000 mg | ORAL_TABLET | Freq: Every day | ORAL | 1 refills | Status: DC

## 2021-03-10 NOTE — Progress Notes (Signed)
Hi Janet Mccullough, no excess protein in the urine which is great.

## 2021-03-10 NOTE — Progress Notes (Signed)
Established Patient Office Visit  Subjective:  Patient ID: Janet Mccullough, female    DOB: 23-Jul-1950  Age: 71 y.o. MRN: [id number]  CC:  Chief Complaint  Patient presents with   Diabetes    Follow up    Diabetes Eye Exam     Patient has appointment scheduled in 04/2021 with [facility]     Harrison Mons Kathrin Ruddy presents for   Diabetes - no hypoglycemic events. No wounds or sores that are not healing well. No increased thirst or urination. Checking glucose at home. Taking medications as prescribed without any side effects.  Upon her metformin at the last visit in November she also had temporary elevation in her sugars after having had COVID.  She has been tolerating the increased dose of metformin well without any side effects or problems.  Patient has appointment scheduled in 04/2021 with [facility]  She is still struggling with low energy.  Not currently exercising.  She did stop her DHEA and says she really has not noticed much of a difference off of it.  Past Medical History:  Diagnosis Date   Cancer ([place])    breast   Impaired fasting glucose    Microscopic hematuria    renal ultrasound normal (Dr Diona Fanti)   Normal echocardiogram    EF 60-65%    Past Surgical History:  Procedure Laterality Date   ABDOMINAL HYSTERECTOMY  07/2001   for DUB/ cysts   MASTECTOMY Left    uterine cyst removed  05/1997    Family History  Problem Relation Age of Onset   Diabetes Father    Heart disease Father    Diabetes Brother    Diabetes Brother        type 2    Social History   Socioeconomic History   Marital status: Widowed    Spouse name: Not on file   Number of children: 2   Years of education: 58   Highest education level: Some college, no degree  Occupational History    Comment: Retired  Tobacco Use   Smoking status: Never   Smokeless tobacco: Never  Substance and Sexual Activity   Alcohol use: No   Drug use: No   Sexual activity: Not on file  Other Topics  Concern   Not on file  Social History Narrative   Lives alone. She has two children of her own and two step children. She enjoys doing crafts, read and learning how to quilt.   Social Determinants of Health   Financial Resource Strain: Low Risk    Difficulty of Paying Living Expenses: Not hard at all  Food Insecurity: No Food Insecurity   Worried About Charity fundraiser in the Last Year: Never true   [place] in the Last Year: Never true  Transportation Needs: No Transportation Needs   Lack of Transportation (Medical): No   Lack of Transportation (Non-Medical): No  Physical Activity: Inactive   Days of Exercise per Week: 0 days   Minutes of Exercise per Session: 0 min  Stress: No Stress Concern Present   Feeling of Stress : Not at all  Social Connections: Moderately Isolated   Frequency of Communication with Friends and Family: More than three times a week   Frequency of Social Gatherings with Friends and Family: More than three times a week   Attends Religious Services: Never   Marine scientist or Organizations: Yes   Attends Music therapist: More than 4  times per year   Marital Status: Widowed  Human resources officer Violence: Not At Risk   Fear of Current or Ex-Partner: No   Emotionally Abused: No   Physically Abused: No   Sexually Abused: No    Outpatient Medications Prior to Visit  Medication Sig Dispense Refill   AMBULATORY NON FORMULARY MEDICATION Take 1 tablet by mouth daily. Medication Name:  Berberine supplement     AMBULATORY NON FORMULARY MEDICATION Take 1 capsule by mouth in the morning and at bedtime. Medication Name: BONE BUILDING SUPPLEMENT     B Complex Vitamins (B-COMPLEX/B-12 SL) Place 1 tablet under the tongue daily.     Blood Glucose Monitoring Suppl (ONE TOUCH ULTRA SYSTEM KIT) W/DEVICE KIT 1 kit by Does not apply route once. Lancets, test strips for testing 3 times a week. Dx: IFG Dx code: R73.01 1 each 0   Cholecalciferol (VITAMIN  D3) 125 MCG (5000 UT) CAPS Take 1 capsule by mouth daily. 132mg (7500 units) per integrative medicine     glucose blood (ONE TOUCH ULTRA TEST) test strip Dx: E11.9. Check fasting blood sugar daily. 100 each 11   Lancets (ONETOUCH DELICA PLUS [id number] MISC USE 1  TO CHECK GLUCOSE IN THE MORNING 100 each prn   MAGNESIUM PO Take 1,350 mg by mouth daily. Mg Maleate (3 capsule daily)     Misc Natural Products (TURMERIC CURCUMIN) CAPS Take 1 capsule by mouth daily. 7527mcapsule     Vitamin Mixture (VITAMIN C) LIQD Take 1,000 mg by mouth daily.     metFORMIN (GLUCOPHAGE-XR) 500 MG 24 hr tablet Take 2 tablets (1,000 mg total) by mouth daily with breakfast. 180 tablet 1   No facility-administered medications prior to visit.    Allergies  Allergen Reactions   Metformin And Related Other (See Comments)    Joint pain and constipation   Moxifloxacin Hcl In Nacl Hives   Penicillins Hives    REACTION: unknown rx   Quinolones Hives    In particular Avelox    ROS Review of Systems    Objective:    Physical Exam Constitutional:      Appearance: Normal appearance. She is well-developed.  HENT:     Head: Normocephalic and atraumatic.  Cardiovascular:     Rate and Rhythm: Normal rate and regular rhythm.     Heart sounds: Normal heart sounds.  Pulmonary:     Effort: Pulmonary effort is normal.     Breath sounds: Normal breath sounds.  Skin:    General: Skin is warm and dry.  Neurological:     Mental Status: She is alert and oriented to person, place, and time.  Psychiatric:        Behavior: Behavior normal.    BP 123/70    Pulse 77    Resp 16    Ht 5' 7"  (1.702 m)    Wt 183 lb (83 kg)    SpO2 98%    BMI 28.66 kg/m  Wt Readings from Last 3 Encounters:  03/10/21 183 lb (83 kg)  12/07/20 183 lb (83 kg)  08/24/20 182 lb (82.6 kg)     Health Maintenance Due  Topic Date Due   OPHTHALMOLOGY EXAM  03/09/2021    There are no preventive care reminders to display for this  patient.  Lab Results  Component Value Date   TSH 2.60 07/29/2015   Lab Results  Component Value Date   WBC 5.9 12/07/2020   HGB 14.4 12/07/2020   HCT 42.1 12/07/2020  MCV 91.3 12/07/2020   PLT 260 12/07/2020   Lab Results  Component Value Date   NA 139 12/07/2020   K 4.6 12/07/2020   CO2 27 12/07/2020   GLUCOSE 213 (H) 12/07/2020   BUN 18 12/07/2020   CREATININE 0.78 12/07/2020   BILITOT 0.7 12/07/2020   ALKPHOS 54 05/05/2016   AST 11 12/07/2020   ALT 18 12/07/2020   PROT 7.3 12/07/2020   ALBUMIN 4.3 05/05/2016   CALCIUM 9.5 12/07/2020   EGFR 82 12/07/2020   Lab Results  Component Value Date   CHOL 196 12/07/2020   Lab Results  Component Value Date   HDL 68 12/07/2020   Lab Results  Component Value Date   LDLCALC 109 (H) 12/07/2020   Lab Results  Component Value Date   TRIG 101 12/07/2020   Lab Results  Component Value Date   CHOLHDL 2.9 12/07/2020   Lab Results  Component Value Date   HGBA1C 7.4 (A) 03/10/2021      Assessment & Plan:   Problem List Items Addressed This Visit       Endocrine   Type 2 diabetes mellitus with other specified complication ([place])    O2U actually looks much better today.  Down to 7.4 we did discuss that the goal is to get it under 7.  She is not actively exercising so we discussed trying to get back onto a new routine now that she is into retirement and figuring out a way to start doing some cardio and some resistance training she does have some exercise bands at home.  Follow-up in 3 months to recheck A1c again she is doing great and has made some great progress she is also been tolerating the increased dose of metformin.  So we will go ahead and send over refills for the next 6 months.  Urine microalbumin performed today.  Labs are up-to-date.      Relevant Medications   metFORMIN (GLUCOPHAGE-XR) 500 MG 24 hr tablet   Other Relevant Orders   POCT HgB A1C (Completed)   POCT UA - Microalbumin (Completed)    Microalbuminuria due to type 2 diabetes mellitus ([place]) - Primary   Relevant Medications   metFORMIN (GLUCOPHAGE-XR) 500 MG 24 hr tablet   Other Relevant Orders   POCT UA - Microalbumin (Completed)   Other Visit Diagnoses     Uncontrolled type 2 diabetes mellitus with hyperglycemia ([place])       Relevant Medications   metFORMIN (GLUCOPHAGE-XR) 500 MG 24 hr tablet       Meds ordered this encounter  Medications   metFORMIN (GLUCOPHAGE-XR) 500 MG 24 hr tablet    Sig: Take 2 tablets (1,000 mg total) by mouth daily with breakfast.    Dispense:  180 tablet    Refill:  1    Follow-up: Return in about 3 months (around 06/14/2021) for Diabetes follow-up.    Beatrice Lecher, MD

## 2021-03-10 NOTE — Assessment & Plan Note (Signed)
A1c actually looks much better today.  Down to 7.4 we did discuss that the goal is to get it under 7.  She is not actively exercising so we discussed trying to get back onto a new routine now that she is into retirement and figuring out a way to start doing some cardio and some resistance training she does have some exercise bands at home.  Follow-up in 3 months to recheck A1c again she is doing great and has made some great progress she is also been tolerating the increased dose of metformin.  So we will go ahead and send over refills for the next 6 months.  Urine microalbumin performed today.  Labs are up-to-date. ?

## 2021-04-14 DIAGNOSIS — C50912 Malignant neoplasm of unspecified site of left female breast: Secondary | ICD-10-CM | POA: Diagnosis not present

## 2021-05-03 DIAGNOSIS — H5203 Hypermetropia, bilateral: Secondary | ICD-10-CM | POA: Diagnosis not present

## 2021-05-03 DIAGNOSIS — H524 Presbyopia: Secondary | ICD-10-CM | POA: Diagnosis not present

## 2021-05-03 DIAGNOSIS — H04123 Dry eye syndrome of bilateral lacrimal glands: Secondary | ICD-10-CM | POA: Diagnosis not present

## 2021-05-03 DIAGNOSIS — H2513 Age-related nuclear cataract, bilateral: Secondary | ICD-10-CM | POA: Diagnosis not present

## 2021-05-03 DIAGNOSIS — E119 Type 2 diabetes mellitus without complications: Secondary | ICD-10-CM | POA: Diagnosis not present

## 2021-05-03 DIAGNOSIS — E139 Other specified diabetes mellitus without complications: Secondary | ICD-10-CM | POA: Diagnosis not present

## 2021-05-03 DIAGNOSIS — Z135 Encounter for screening for eye and ear disorders: Secondary | ICD-10-CM | POA: Diagnosis not present

## 2021-05-03 LAB — HM DIABETES EYE EXAM

## 2021-05-21 ENCOUNTER — Encounter: Payer: Self-pay | Admitting: Family Medicine

## 2021-06-09 DIAGNOSIS — C50912 Malignant neoplasm of unspecified site of left female breast: Secondary | ICD-10-CM | POA: Diagnosis not present

## 2021-06-14 ENCOUNTER — Encounter: Payer: Self-pay | Admitting: Family Medicine

## 2021-06-14 ENCOUNTER — Ambulatory Visit (INDEPENDENT_AMBULATORY_CARE_PROVIDER_SITE_OTHER): Payer: Medicare HMO | Admitting: Family Medicine

## 2021-06-14 VITALS — BP 108/68 | HR 77 | Resp 16 | Ht 67.0 in | Wt 181.0 lb

## 2021-06-14 DIAGNOSIS — E1169 Type 2 diabetes mellitus with other specified complication: Secondary | ICD-10-CM | POA: Diagnosis not present

## 2021-06-14 DIAGNOSIS — R5383 Other fatigue: Secondary | ICD-10-CM | POA: Diagnosis not present

## 2021-06-14 LAB — POCT GLYCOSYLATED HEMOGLOBIN (HGB A1C): Hemoglobin A1C: 7.8 % — AB (ref 4.0–5.6)

## 2021-06-14 NOTE — Progress Notes (Signed)
Established Patient Office Visit  Subjective   Patient ID: Janet Mccullough, female    DOB: December 02, 1950  Age: 71 y.o. MRN: [id number]  Chief Complaint  Patient presents with   Diabetes    Follow up     HPI  Diabetes - no hypoglycemic events. No wounds or sores that are not healing well. No increased thirst or urination. Checking glucose at home. Taking medications as prescribed without any side effects.  She says she did have a few days where her blood sugar ran really high but she is not sure why nothing seemed to be different or unusual.  She was not sick around that time.  She has noticed that she feels more tired in general since increasing the metformin to 2 tabs.  She will have some occasional diarrhea but most of the time battles constipation.     ROS    Objective:     BP 108/68   Pulse 77   Resp 16   Ht '5\' 7"'$  (1.702 m)   Wt 181 lb (82.1 kg)   SpO2 96%   BMI 28.35 kg/m    Physical Exam Vitals and nursing note reviewed.  Constitutional:      Appearance: She is well-developed.  HENT:     Head: Normocephalic and atraumatic.  Cardiovascular:     Rate and Rhythm: Normal rate and regular rhythm.     Heart sounds: Normal heart sounds.  Pulmonary:     Effort: Pulmonary effort is normal.     Breath sounds: Normal breath sounds.  Skin:    General: Skin is warm and dry.  Neurological:     Mental Status: She is alert and oriented to person, place, and time.  Psychiatric:        Behavior: Behavior normal.    Results for orders placed or performed in visit on 06/14/21  POCT HgB A1C  Result Value Ref Range   Hemoglobin A1C 7.8 (A) 4.0 - 5.6 %   HbA1c POC (<> result, manual entry)     HbA1c, POC (prediabetic range)     HbA1c, POC (controlled diabetic range)        The 10-year ASCVD risk score (Arnett DK, et al., 2019) is: 13.8%    Assessment & Plan:   Problem List Items Addressed This Visit       Endocrine   Type 2 diabetes mellitus with other specified  complication ([place]) - Primary    We did discuss the possibility of GLP-1 again she seems most interested in possibly Rybelsus but wants to do a little bit more reading first and let me know.  I think that is perfectly reasonable.  Continue to work on healthy diet and regular exercise..  We also discussed separating the metformin so instead of taking 2 tabs in the morning may be taking 1 in the morning and 1 in the evening to see if that helps with the fatigue that she has been experiencing.  A1c did go up up slightly to 7.8.  It looks better back in March at 7.4.  She will let me know about the GLP-1.  Unable to give a urine sample as she had just gone to the bathroom this morning.       Relevant Orders   POCT HgB A1C (Completed)   BASIC METABOLIC PANEL WITH GFR   Other Visit Diagnoses     Other fatigue           Return in about 14 weeks (around  09/20/2021) for Diabetes follow-up.   I spent 30 minutes on the day of the encounter to include pre-visit record review, face-to-face time with the patient and post visit ordering of test.   Beatrice Lecher, MD

## 2021-06-14 NOTE — Assessment & Plan Note (Signed)
We did discuss the possibility of GLP-1 again she seems most interested in possibly Rybelsus but wants to do a little bit more reading first and let me know.  I think that is perfectly reasonable.  Continue to work on healthy diet and regular exercise..  We also discussed separating the metformin so instead of taking 2 tabs in the morning may be taking 1 in the morning and 1 in the evening to see if that helps with the fatigue that she has been experiencing.  A1c did go up up slightly to 7.8.  It looks better back in March at 7.4.  She will let me know about the GLP-1.  Unable to give a urine sample as she had just gone to the bathroom this morning.

## 2021-06-15 LAB — BASIC METABOLIC PANEL WITH GFR
BUN: 16 mg/dL (ref 7–25)
CO2: 27 mmol/L (ref 20–32)
Calcium: 9.6 mg/dL (ref 8.6–10.4)
Chloride: 103 mmol/L (ref 98–110)
Creat: 0.62 mg/dL (ref 0.60–1.00)
Glucose, Bld: 191 mg/dL — ABNORMAL HIGH (ref 65–99)
Potassium: 4.4 mmol/L (ref 3.5–5.3)
Sodium: 139 mmol/L (ref 135–146)
eGFR: 95 mL/min/{1.73_m2} (ref >=60)

## 2021-06-15 NOTE — Progress Notes (Signed)
Your lab work is within acceptable range and there are no concerning findings.   ?

## 2021-06-17 ENCOUNTER — Ambulatory Visit (INDEPENDENT_AMBULATORY_CARE_PROVIDER_SITE_OTHER): Payer: Medicare HMO | Admitting: Pharmacist

## 2021-06-17 DIAGNOSIS — M8580 Other specified disorders of bone density and structure, unspecified site: Secondary | ICD-10-CM

## 2021-06-17 DIAGNOSIS — E1169 Type 2 diabetes mellitus with other specified complication: Secondary | ICD-10-CM

## 2021-06-17 NOTE — Patient Instructions (Signed)
Visit Information  Thank you for taking time to visit with me today. Please don't hesitate to contact me if I can be of assistance to you before our next scheduled telephone appointment.  Following are the goals we discussed today:  Patient Goals/Self-Care Activities Over the next 90 days, patient will:  take medications as prescribed, target a minimum of 150 minutes of moderate intensity exercise weekly, and engage in dietary modifications by opting for lean meats + veggies, and consuming carbs/sugar in moderation.  Follow Up Plan: Telephone follow up appointment with care management team member scheduled for:  3-6 months   Please call the care guide team at [phone] if you need to cancel or reschedule your appointment.   Patient verbalizes understanding of instructions and care plan provided today and agrees to view in [place]. Active MyChart status and patient understanding of how to access instructions and care plan via MyChart confirmed with patient.     Janet Mccullough

## 2021-06-17 NOTE — Progress Notes (Signed)
Chronic Care Management Pharmacy Note  06/17/2021 Name:  Delisa Finck MRN:  [id number] DOB:  06-30-1950  Summary: addressed DM, HLD, osteopenia. Patient prefers non-pharmacologic therapy & integrative medicine as primary approach. Adjusting metformin to 1 tablet in AM and 1 tablet in PM, instead of 2 tablets in AM.  Also discussed cholesterol, goal LDL <70. May need to incorporate statin use, patient declines at this time.  Patient reported BG: mostly 130s fasting, and 180s postprandial.  Recommendations/Changes made from today's visit: none  Provided discussion and counseling regarding GLP1, highly recommend rybelsus at next PCP visit if A1c >7. Patient expressed understanding.  Plan: f/u with pharmacist in 3-6 months  Subjective: Sugey Trevathan is an 71 y.o. year old female who is a primary patient of Metheney, Rene Kocher, MD.  The CCM team was consulted for assistance with disease management and care coordination needs.    Engaged with patient by telephone for follow up visit in response to provider referral for pharmacy case management and/or care coordination services.   Consent to Services:  The patient was given information about Chronic Care Management services, agreed to services, and gave verbal consent prior to initiation of services.  Please see initial visit note for detailed documentation.   Patient Care Team: Hali Marry, MD as PCP - General (Family Medicine) Darius Bump, [facility] as Pharmacist (Pharmacist)  Recent office visits:  08/24/20-Catherine D. Madilyn Fireman, MD (PCP) Diabetic follow up. Labs ordered. Follow up in 15 weeks. 06/01/20-Catherine D. Madilyn Fireman, MD (PCP) Seen for arm pain. Right shoulder pain-suspect bursitis.  Recommend ice, anti-inflammatory and range of motion exercises given on handout.  05/20/20-Catherine D. Madilyn Fireman, MD (PCP) Medicare annual wellness. Mammogram ordered. 05/19/20-Catherine D. Madilyn Fireman, MD (PCP) Diabetic follow up. Start with metformin 5 mg ER. Labs ordered. Follow up in 3 months.   Recent consult  visits:  03/09/20-Brian Kenton [facility]) Notes not available.   Hospital visits:  None in previous 6 months  Objective:  Lab Results  Component Value Date   CREATININE 0.62 06/14/2021   CREATININE 0.78 12/07/2020   CREATININE 0.72 05/19/2020    Lab Results  Component Value Date   HGBA1C 7.8 (A) 06/14/2021   Last diabetic Eye exam:  Lab Results  Component Value Date/Time   HMDIABEYEEXA No Retinopathy 05/03/2021 12:00 AM    Last diabetic Foot exam: No results found for: "HMDIABFOOTEX"      Component Value Date/Time   CHOL 196 12/07/2020 1033   TRIG 101 12/07/2020 1033   HDL 68 12/07/2020 1033   CHOLHDL 2.9 12/07/2020 1033   VLDL 21 04/29/2015 0907   LDLCALC 109 (H) 12/07/2020 1033       Latest Ref Rng & Units 12/07/2020   10:33 AM 11/07/2019   10:03 AM 07/18/2019    4:54 PM  Hepatic Function  Total Protein 6.1 - 8.1 g/dL 7.3  7.2  6.9   AST 10 - 35 U/L 11  9  10    ALT 6 - 29 U/L 18  11  18    Total Bilirubin 0.2 - 1.2 mg/dL 0.7  0.7  0.5     Lab Results  Component Value Date/Time   TSH 2.60 07/29/2015 09:00 AM   TSH 2.567 05/13/2010 09:03 AM       Latest Ref Rng & Units 12/07/2020   10:33 AM 07/18/2019    4:54 PM 07/29/2015    9:00 AM  CBC  WBC 3.8 - 10.8 Thousand/uL 5.9  6.3  5.3   Hemoglobin 11.7 - 15.5 g/dL 14.4  13.4  14.0   Hematocrit 35.0 - 45.0 % 42.1  38.1  41.2   Platelets 140 - 400 Thousand/uL 260  320  271     Lab Results  Component Value Date/Time   VD25OH 32 07/29/2015 09:00 AM     BP Readings from Last 3 Encounters:  06/14/21 108/68  03/10/21 123/70  12/07/20 (!) 118/56   Pulse Readings from Last 3 Encounters:  06/14/21 77  03/10/21 77  12/07/20 76   Wt Readings from Last 3 Encounters:  06/14/21 181 lb (82.1 kg)  03/10/21 183 lb (83 kg)  12/07/20 183 lb (83 kg)    Assessment: Review of patient past medical history, allergies, medications, health status, including review of consultants reports, laboratory and other test  data, was performed as part of comprehensive evaluation and provision of chronic care management services.   SDOH:  (Social Determinants of Health) assessments and interventions performed:    CCM Care Plan  Allergies  Allergen Reactions   Moxifloxacin Hcl In Nacl Hives   Penicillins Hives    REACTION: unknown rx   Quinolones Hives    In particular Avelox    Medications Reviewed Today     Reviewed by Hali Marry, MD (Physician) on 06/14/21 at [phone]  Med List Status: <None>   Medication Order Taking? Sig Documenting Provider Last Dose Status Informant  AMBULATORY NON FORMULARY MEDICATION [id number] Yes Take 1 tablet by mouth daily. Medication Name:  Berberine supplement [provider] Taking Active Self  AMBULATORY NON FORMULARY MEDICATION [id number] Yes Take 1 capsule by mouth in the morning and at bedtime. Medication Name: BONE BUILDING SUPPLEMENT [provider] Taking Active Self  B Complex Vitamins (B-COMPLEX/B-12 SL) [id number] Yes Place 1 tablet under the tongue daily. [provider] Taking Active   Blood Glucose Monitoring Suppl (ONE TOUCH ULTRA SYSTEM KIT) W/DEVICE KIT [id number] Yes 1 kit by Does not apply route once. Lancets, test strips for testing 3 times a week. Dx: IFG Dx code: J67.34 Hali Marry, MD Taking Active   Cholecalciferol (VITAMIN D3) 125 MCG (5000 UT) CAPS [id number] Yes Take 1 capsule by mouth daily. 168mg (7500 units) per integrative medicine [provider] Taking Active   glucose blood (ONE TOUCH ULTRA TEST) test strip [id number] Dx: E11.9. Check fasting blood sugar daily. MHali Marry MD Taking Active   Lancets (ONETOUCH DELICA PLUS LEQASTM19Q [id number] USE 1  TO CHECK GLUCOSE IN THE MORNING MHali Marry MD Taking Active   MAGNESIUM PO [id number] Take 1,350 mg by mouth daily. Mg Maleate (3 capsule daily) [provider] Taking Active   metFORMIN (GLUCOPHAGE-XR)  500 MG 24 hr tablet [id number] Take 2 tablets (1,000 mg total) by mouth daily with breakfast. MHali Marry MD Taking Active   Misc Natural Products ([place] CAPS [id number] Take 1 capsule by mouth daily. 756mcapsule [provider] Taking Active   Vitamin Mixture (VITAMIN C) LIQD [id number] Take 1,000 mg by mouth daily. [provider] Taking Active             Patient Active Problem List   Diagnosis Date Noted   History of COVID-19 02/20/2020   Microalbuminuria due to type 2 diabetes mellitus (HCWhiteface06/30/2020   Ductal carcinoma (HCSleepy Hollow11/26/2018   Coccydynia 09/21/2016   Osteopenia 10/29/2013   VASOMOTOR RHINITIS 06/16/2008   HAY FEVER 04/14/2008   HEMATURIA 04/24/2006   Type 2 diabetes mellitus with other specified complication (HCThayer0341/28/7867 History of breast cancer 10/18/2005   MENOPAUSAL SYNDROME 10/18/2005    Immunization History  Administered Date(s) Administered   Td 01/10/2002    Conditions to be addressed/monitored: HLD and DMII  There are no care plans that you recently modified to display for this patient.    Medication Assistance: None required.  Patient affirms current coverage meets needs.  Patient's preferred pharmacy is:  [facility] [place] [facility] [place] [address] [place] [address] Phone: [phone] Fax: [phone]   Uses pill box? Yes Pt endorses 100% compliance  Follow Up:  Patient agrees to Care Plan and Follow-up.  Plan: Telephone follow up appointment with care management team member scheduled for:  3-6 months  Larinda Buttery, PharmD Clinical Pharmacist [place] [facility] [phone]

## 2021-07-01 ENCOUNTER — Ambulatory Visit (INDEPENDENT_AMBULATORY_CARE_PROVIDER_SITE_OTHER): Payer: Medicare HMO | Admitting: Family Medicine

## 2021-07-01 ENCOUNTER — Telehealth: Payer: Self-pay | Admitting: General Practice

## 2021-07-01 DIAGNOSIS — Z0000 Encounter for general adult medical examination without abnormal findings: Secondary | ICD-10-CM

## 2021-07-01 NOTE — Progress Notes (Cosign Needed)
MEDICARE ANNUAL WELLNESS VISIT  07/01/2021  Telephone Visit Disclaimer This Medicare AWV was conducted by telephone due to national recommendations for restrictions regarding the COVID-19 Pandemic (e.g. social distancing).  I verified, using two identifiers, that I am speaking with Janet Mccullough or their authorized healthcare agent. I discussed the limitations, risks, security, and privacy concerns of performing an evaluation and management service by telephone and the potential availability of an in-person appointment in the future. The patient expressed understanding and agreed to proceed.  Location of Patient: Home Location of Provider (nurse):  Provider home  Subjective:    Janet Mccullough is a 71 y.o. female patient of Metheney, Rene Kocher, MD who had a Medicare Annual Wellness Visit today via telephone. Janet Mccullough is Retired and lives alone. she has 2 children. she reports that she is socially active and does interact with friends/family regularly. she is minimally physically active and enjoys doing crafts, read and learning how to quilt.  Patient Care Team: Hali Marry, MD as PCP - General (Family Medicine) Darius Bump, [facility] as Pharmacist (Pharmacist)     07/01/2021    3:56 PM 05/20/2020   11:29 AM 10/03/2016   10:17 AM  Advanced Directives  Does Patient Have a Medical Advance Directive? Yes Yes Yes  Type of Advance Directive Living will [place];Living will [place];Living will  Does patient want to make changes to medical advance directive? No - Patient declined No - Patient declined   Copy of [place] in Chart?  No - copy requested No - copy requested  Would patient like information on creating a medical advance directive?  No - Patient declined     Hospital Utilization Over the Past 12 Months: # of hospitalizations or ER visits: 0 # of surgeries: 0  Review of Systems    Patient reports that her  overall health is unchanged compared to last year.  History obtained from chart review and the patient  Patient Reported Readings (BP, Pulse, CBG, Weight, etc) none  Pain Assessment Pain : No/denies pain     Current Medications & Allergies (verified) Allergies as of 07/01/2021       Reactions   Moxifloxacin Hcl In Nacl Hives   Penicillins Hives   REACTION: unknown rx   Quinolones Hives   In particular Avelox        Medication List        Accurate as of July 01, 2021  4:08 PM. If you have any questions, ask your nurse or doctor.          AMBULATORY NON FORMULARY MEDICATION Take 1 tablet by mouth daily. Medication Name:  Berberine supplement   AMBULATORY NON FORMULARY MEDICATION Take 1 capsule by mouth in the morning and at bedtime. Medication Name: BONE BUILDING SUPPLEMENT   B-COMPLEX/B-12 SL Place 1 tablet under the tongue daily.   glucose blood test strip Commonly known as: ONE TOUCH ULTRA TEST Dx: E11.9. Check fasting blood sugar daily.   MAGNESIUM PO Take 1,350 mg by mouth daily. Mg Maleate (3 capsule daily)   metFORMIN 500 MG 24 hr tablet Commonly known as: GLUCOPHAGE-XR Take 2 tablets (1,000 mg total) by mouth daily with breakfast.   ONE TOUCH ULTRA SYSTEM KIT w/Device Kit 1 kit by Does not apply route once. Lancets, test strips for testing 3 times a week. Dx: IFG Dx code: T01.60   OneTouch Delica Plus [id number] Misc USE 1  TO CHECK GLUCOSE IN THE  MORNING   Turmeric Curcumin Caps Take 1 capsule by mouth daily. 729m capsule   Vitamin C Liqd Take 1,000 mg by mouth daily.   Vitamin D3 125 MCG (5000 UT) Caps Take 1 capsule by mouth daily. 1864m (7500 units) per integrative medicine        History (reviewed): Past Medical History:  Diagnosis Date   Allergy as child   Pennicillin, Avalox (few years ago)   Arthritis    Blood transfusion without reported diagnosis [id number] During surgery   Cancer (HCLostant   breast   GERD (gastroesophageal  reflux disease) Late 80's   no current problems   Impaired fasting glucose    Microscopic hematuria    renal ultrasound normal (Dr DaDiona Fanti  Normal echocardiogram    EF 60-65%   Past Surgical History:  Procedure Laterality Date   ABDOMINAL HYSTERECTOMY  07/2001   for DUB/ cysts   BREAST SURGERY  1987   Mastectomy - left   MASTECTOMY Left    TUBAL LIGATION  1979   uterine cyst removed  05/1997   Family History  Problem Relation Age of Onset   Diabetes Mother    Diabetes Father    Heart disease Father    Stroke Father    Diabetes Brother    Diabetes Brother        type 2   ADD / ADHD Son    Social History   Socioeconomic History   Marital status: Widowed    Spouse name: Not on file   Number of children: 2   Years of education: 1328 Highest education level: Some college, no degree  Occupational History    Comment: Retired  Tobacco Use   Smoking status: Never   Smokeless tobacco: Never  Substance and Sexual Activity   Alcohol use: Not Currently    Comment: Occasionally   Drug use: Never   Sexual activity: Not Currently    Birth control/protection: None  Other Topics Concern   Not on file  Social History Narrative   Lives alone. She has two children of her own and two step children. She enjoys doing crafts, read and learning how to quilt.   Social Determinants of Health   Financial Resource Strain: Low Risk  (06/27/2021)   Overall Financial Resource Strain (CARDIA)    Difficulty of Paying Living Expenses: Not hard at all  Food Insecurity: No Food Insecurity (06/27/2021)   Hunger Vital Sign    Worried About Running Out of Food in the Last Year: Never true    Ran Out of Food in the Last Year: Never true  Transportation Needs: No Transportation Needs (06/27/2021)   PRAPARE - TrHydrologistMedical): No    Lack of Transportation (Non-Medical): No  Physical Activity: Insufficiently Active (06/27/2021)   Exercise Vital Sign    Days of  Exercise per Week: 1 day    Minutes of Exercise per Session: 20 min  Stress: No Stress Concern Present (06/27/2021)   Fi[place]l  Feeling of Stress : Not at all  Social Connections: Moderately Isolated (07/01/2021)   Social Connection and Isolation Panel [NHANES]    Frequency of Communication with Friends and Family: More than three times a week    Frequency of Social Gatherings with Friends and Family: Twice a week    Attends Religious Services: Never    AcMarine scientistr Organizations: Yes  Attends Archivist Meetings: 1 to 4 times per year    Marital Status: Widowed    Activities of Daily Living    06/27/2021    3:29 PM  In your present state of health, do you have any difficulty performing the following activities:  Hearing? 0  Vision? 0  Difficulty concentrating or making decisions? 0  Walking or climbing stairs? 0  Dressing or bathing? 0  Doing errands, shopping? 0  Preparing Food and eating ? N  Using the Toilet? N  In the past six months, have you accidently leaked urine? Y  Do you have problems with loss of bowel control? N  Managing your Medications? N  Managing your Finances? N  Housekeeping or managing your Housekeeping? N    Patient Education/ Literacy How often do you need to have someone help you when you read instructions, pamphlets, or other written materials from your doctor or pharmacy?: 1 - Never What is the last grade level you completed in school?: 12th grade and some college  Exercise Current Exercise Habits: The patient does not participate in regular exercise at present, Exercise limited by: None identified  Diet Patient reports consuming 2 meals a day and 1 snack(s) a day Patient reports that her primary diet is: Regular Patient reports that she does have regular access to food.   Depression Screen    07/01/2021    3:57 PM 03/10/2021    9:41 AM 08/24/2020     9:26 AM 05/20/2020   11:24 AM 05/19/2020    8:53 AM 08/07/2019    9:17 AM 05/08/2019    9:05 AM  PHQ 2/9 Scores  PHQ - 2 Score 0 0 0 0 0 0 0  PHQ- 9 Score      3      Fall Risk    06/27/2021    3:29 PM 06/14/2021    8:39 AM 03/10/2021    9:41 AM 08/24/2020    9:26 AM 05/20/2020   11:24 AM  [place] in the past year? 0 0 0 0 0  Number falls in past yr:  0 0 0 0  Injury with Fall?  0 0 0 0  Risk for fall due to :  No Fall Risks No Fall Risks No Fall Risks   Follow up  Falls prevention discussed;Falls evaluation completed Falls prevention discussed;Falls evaluation completed Falls prevention discussed;Falls evaluation completed Education provided;Falls evaluation completed     Objective:  Janet Mccullough seemed alert and oriented and she participated appropriately during our telephone visit.  Blood Pressure Weight BMI  BP Readings from Last 3 Encounters:  06/14/21 108/68  03/10/21 123/70  12/07/20 (!) 118/56   Wt Readings from Last 3 Encounters:  06/14/21 181 lb (82.1 kg)  03/10/21 183 lb (83 kg)  12/07/20 183 lb (83 kg)   BMI Readings from Last 1 Encounters:  06/14/21 28.35 kg/m    *Unable to obtain current vital signs, weight, and BMI due to telephone visit type  Hearing/Vision  Janet Mccullough did not seem to have difficulty with hearing/understanding during the telephone conversation Reports that she has had a formal eye exam by an eye care professional within the past year Reports that she has not had a formal hearing evaluation within the past year *Unable to fully assess hearing and vision during telephone visit type  Cognitive Function:    07/01/2021    4:02 PM 05/20/2020   11:33 AM  6CIT Screen  What Year? 0  points 0 points  What month? 0 points 0 points  What time? 0 points 0 points  Count back from 20 0 points 0 points  Months in reverse 0 points 0 points  Repeat phrase 0 points 0 points  Total Score 0 points 0 points   (Normal:0-7, Significant for  Dysfunction: >8)  Normal Cognitive Function Screening: Yes   Immunization & Health Maintenance Record Immunization History  Administered Date(s) Administered   Td 01/10/2002    Health Maintenance  Topic Date Due   Zoster Vaccines- Shingrix (1 of 2) 09/14/2021 (Originally 03/04/1969)   TETANUS/TDAP  12/07/2021 (Originally 01/11/2012)   Pneumonia Vaccine 53+ Years old (1 - PCV) 03/11/2022 (Originally 03/05/2015)   COVID-19 Vaccine (1) 05/07/2024 (Originally 09/02/1950)   INFLUENZA VACCINE  04/10/2027 (Originally 08/10/2021)   HEMOGLOBIN A1C  12/14/2021   FOOT EXAM  03/11/2022   URINE MICROALBUMIN  03/11/2022   OPHTHALMOLOGY EXAM  05/04/2022   Fecal DNA (Cologuard)  09/05/2022   MAMMOGRAM  11/19/2022   DEXA SCAN  10/23/2028   Hepatitis C Screening  Completed   HPV VACCINES  Aged Out       Assessment  This is a routine wellness examination for Janet Mccullough.  Health Maintenance: Due or Overdue There are no preventive care reminders to display for this patient.  Janet Mccullough does not need a referral for Community Assistance: Care Management:   no Social Work:    no Prescription Assistance:  no Nutrition/Diabetes Education:  no   Plan:  Personalized Goals  Goals Addressed               This Visit's Progress     Patient Stated (pt-stated)        07/01/2021 AWV Goal: Diabetes Management  Patient will maintain an A1C level below 8.0 Patient will not develop any diabetic foot complications Patient will not experience any hypoglycemic episodes over the next 3 months Patient will notify our office of any CBG readings outside of the provider recommended range by calling [phone] Patient will adhere to provider recommendations for diabetes management  Patient Self Management Activities take all medications as prescribed and report any negative side effects monitor and record blood sugar readings as directed adhere to a low carbohydrate diet that incorporates lean  proteins, vegetables, whole grains, low glycemic fruits check feet daily noting any sores, cracks, injuries, or callous formations see PCP or podiatrist if she notices any changes in her legs, feet, or toenails Patient will visit PCP and have an A1C level checked every 3 to 6 months as directed  have a yearly eye exam to monitor for vascular changes associated with diabetes and will request that the report be sent to her pcp.  consult with her PCP regarding any changes in her health or new or worsening symptoms        Personalized Health Maintenance & Screening Recommendations  Pneumococcal vaccine  Td vaccine Shingrix vaccine  Patient declined all of the vaccines at this time.  Lung Cancer Screening Recommended: no (Low Dose CT Chest recommended if Age 19-80 years, 30 pack-year currently smoking OR have quit w/in past 15 years) Hepatitis C Screening recommended: no HIV Screening recommended: no  Advanced Directives: Written information was not prepared per patient's request.  Referrals & Orders No orders of the defined types were placed in this encounter.   Follow-up Plan Follow-up with Hali Marry, MD as planned Medicare wellness visit in one year.  Patient will access AVS on my chart.  I have personally reviewed and noted the following in the patient's chart:   Medical and social history Use of alcohol, tobacco or illicit drugs  Current medications and supplements Functional ability and status Nutritional status Physical activity Advanced directives List of other physicians Hospitalizations, surgeries, and ER visits in previous 12 months Vitals Screenings to include cognitive, depression, and falls Referrals and appointments  In addition, I have reviewed and discussed with Janet Mccullough certain preventive protocols, quality metrics, and best practice recommendations. A written personalized care plan for preventive services as well as general preventive  health recommendations is available and can be mailed to the patient at her request.      Tinnie Gens  07/01/2021

## 2021-07-01 NOTE — Patient Instructions (Addendum)
[facility] Maintenance Summary and Written Plan of Care  Ms. Janet Mccullough ,  Thank you for allowing me to perform your Medicare Annual Wellness Visit and for your ongoing commitment to your health.   Health Maintenance & Immunization History Health Maintenance  Topic Date Due  . Zoster Vaccines- Shingrix (1 of 2) 09/14/2021 (Originally 03/04/1969)  . TETANUS/TDAP  12/07/2021 (Originally 01/11/2012)  . Pneumonia Vaccine 48+ Years old (1 - PCV) 03/11/2022 (Originally 03/05/2015)  . COVID-19 Vaccine (1) 05/07/2024 (Originally 09/02/1950)  . INFLUENZA VACCINE  04/10/2027 (Originally 08/10/2021)  . HEMOGLOBIN A1C  12/14/2021  . FOOT EXAM  03/11/2022  . URINE MICROALBUMIN  03/11/2022  . OPHTHALMOLOGY EXAM  05/04/2022  . Fecal DNA (Cologuard)  09/05/2022  . MAMMOGRAM  11/19/2022  . DEXA SCAN  10/23/2028  . Hepatitis C Screening  Completed  . HPV VACCINES  Aged Out   Immunization History  Administered Date(s) Administered  . Td 01/10/2002    These are the patient goals that we discussed:  Goals Addressed              This Visit's Progress   .  Patient Stated (pt-stated)        07/01/2021 AWV Goal: Diabetes Management  Patient will maintain an A1C level below 8.0 Patient will not develop any diabetic foot complications Patient will not experience any hypoglycemic episodes over the next 3 months Patient will notify our office of any CBG readings outside of the provider recommended range by calling [phone] Patient will adhere to provider recommendations for diabetes management  Patient Self Management Activities take all medications as prescribed and report any negative side effects monitor and record blood sugar readings as directed adhere to a low carbohydrate diet that incorporates lean proteins, vegetables, whole grains, low glycemic fruits check feet daily noting any sores, cracks, injuries, or callous formations see PCP or podiatrist if she notices  any changes in her legs, feet, or toenails Patient will visit PCP and have an A1C level checked every 3 to 6 months as directed  have a yearly eye exam to monitor for vascular changes associated with diabetes and will request that the report be sent to her pcp.  consult with her PCP regarding any changes in her health or new or worsening symptoms         This is a list of Health Maintenance Items that are overdue or due now: Pneumococcal vaccine  Td vaccine Shingrix vaccine  Patient declined all of the vaccines at this time.  Orders/Referrals Placed Today: No orders of the defined types were placed in this encounter.  (Contact our referral department at [phone] if you have not spoken with someone about your referral appointment within the next 5 days)    Follow-up Plan Follow-up with Hali Marry, MD as planned Medicare wellness visit in one year.  Patient will access AVS on my chart.      Health Maintenance, Female Adopting a healthy lifestyle and getting preventive care are important in promoting health and wellness. Ask your health care provider about: The right schedule for you to have regular tests and exams. Things you can do on your own to prevent diseases and keep yourself healthy. What should I know about diet, weight, and exercise? Eat a healthy diet  Eat a diet that includes plenty of vegetables, fruits, low-fat dairy products, and lean protein. Do not eat a lot of foods that are high in solid fats, added sugars, or sodium. Maintain  a healthy weight Body mass index (BMI) is used to identify weight problems. It estimates body fat based on height and weight. Your health care provider can help determine your BMI and help you achieve or maintain a healthy weight. Get regular exercise Get regular exercise. This is one of the most important things you can do for your health. Most adults should: Exercise for at least 150 minutes each week. The exercise  should increase your heart rate and make you sweat (moderate-intensity exercise). Do strengthening exercises at least twice a week. This is in addition to the moderate-intensity exercise. Spend less time sitting. Even light physical activity can be beneficial. Watch cholesterol and blood lipids Have your blood tested for lipids and cholesterol at 71 years of age, then have this test every 5 years. Have your cholesterol levels checked more often if: Your lipid or cholesterol levels are high. You are older than 71 years of age. You are at high risk for heart disease. What should I know about cancer screening? Depending on your health history and family history, you may need to have cancer screening at various ages. This may include screening for: Breast cancer. Cervical cancer. Colorectal cancer. Skin cancer. Lung cancer. What should I know about heart disease, diabetes, and high blood pressure? Blood pressure and heart disease High blood pressure causes heart disease and increases the risk of stroke. This is more likely to develop in people who have high blood pressure readings or are overweight. Have your blood pressure checked: Every 3-5 years if you are 70-19 years of age. Every year if you are 24 years old or older. Diabetes Have regular diabetes screenings. This checks your fasting blood sugar level. Have the screening done: Once every three years after age 83 if you are at a normal weight and have a low risk for diabetes. More often and at a younger age if you are overweight or have a high risk for diabetes. What should I know about preventing infection? Hepatitis B If you have a higher risk for hepatitis B, you should be screened for this virus. Talk with your health care provider to find out if you are at risk for hepatitis B infection. Hepatitis C Testing is recommended for: Everyone born from [birth year] through 1965. Anyone with known risk factors for hepatitis C. Sexually  transmitted infections (STIs) Get screened for STIs, including gonorrhea and chlamydia, if: You are sexually active and are younger than 71 years of age. You are older than 71 years of age and your health care provider tells you that you are at risk for this type of infection. Your sexual activity has changed since you were last screened, and you are at increased risk for chlamydia or gonorrhea. Ask your health care provider if you are at risk. Ask your health care provider about whether you are at high risk for HIV. Your health care provider may recommend a prescription medicine to help prevent HIV infection. If you choose to take medicine to prevent HIV, you should first get tested for HIV. You should then be tested every 3 months for as long as you are taking the medicine. Pregnancy If you are about to stop having your period (premenopausal) and you may become pregnant, seek counseling before you get pregnant. Take 400 to 800 micrograms (mcg) of folic acid every day if you become pregnant. Ask for birth control (contraception) if you want to prevent pregnancy. Osteoporosis and menopause Osteoporosis is a disease in which the bones lose minerals  and strength with aging. This can result in bone fractures. If you are 10 years old or older, or if you are at risk for osteoporosis and fractures, ask your health care provider if you should: Be screened for bone loss. Take a calcium or vitamin D supplement to lower your risk of fractures. Be given hormone replacement therapy (HRT) to treat symptoms of menopause. Follow these instructions at home: Alcohol use Do not drink alcohol if: Your health care provider tells you not to drink. You are pregnant, may be pregnant, or are planning to become pregnant. If you drink alcohol: Limit how much you have to: 0-1 drink a day. Know how much alcohol is in your drink. In the [place] one drink equals one 12 oz bottle of beer (355 mL), one 5 oz glass of wine (148  mL), or one 1 oz glass of hard liquor (44 mL). Lifestyle Do not use any products that contain nicotine or tobacco. These products include cigarettes, chewing tobacco, and vaping devices, such as e-cigarettes. If you need help quitting, ask your health care provider. Do not use street drugs. Do not share needles. Ask your health care provider for help if you need support or information about quitting drugs. General instructions Schedule regular health, dental, and eye exams. Stay current with your vaccines. Tell your health care provider if: You often feel depressed. You have ever been abused or do not feel safe at home. Summary Adopting a healthy lifestyle and getting preventive care are important in promoting health and wellness. Follow your health care provider's instructions about healthy diet, exercising, and getting tested or screened for diseases. Follow your health care provider's instructions on monitoring your cholesterol and blood pressure. This information is not intended to replace advice given to you by your health care provider. Make sure you discuss any questions you have with your health care provider. Document Revised: 05/18/2020 Document Reviewed: 05/18/2020 Elsevier Patient Education  [place].

## 2021-07-01 NOTE — Telephone Encounter (Signed)
error 

## 2021-07-09 DIAGNOSIS — E1169 Type 2 diabetes mellitus with other specified complication: Secondary | ICD-10-CM

## 2021-07-09 DIAGNOSIS — E785 Hyperlipidemia, unspecified: Secondary | ICD-10-CM

## 2021-07-12 ENCOUNTER — Encounter: Payer: Self-pay | Admitting: Family Medicine

## 2021-07-12 ENCOUNTER — Ambulatory Visit (INDEPENDENT_AMBULATORY_CARE_PROVIDER_SITE_OTHER): Payer: Medicare HMO | Admitting: Family Medicine

## 2021-07-12 ENCOUNTER — Other Ambulatory Visit: Payer: Self-pay | Admitting: Family Medicine

## 2021-07-12 VITALS — BP 111/55 | HR 82 | Ht 67.0 in | Wt 180.0 lb

## 2021-07-12 DIAGNOSIS — E1129 Type 2 diabetes mellitus with other diabetic kidney complication: Secondary | ICD-10-CM

## 2021-07-12 DIAGNOSIS — D229 Melanocytic nevi, unspecified: Secondary | ICD-10-CM | POA: Diagnosis not present

## 2021-07-12 DIAGNOSIS — Z8589 Personal history of malignant neoplasm of other organs and systems: Secondary | ICD-10-CM

## 2021-07-12 DIAGNOSIS — R809 Proteinuria, unspecified: Secondary | ICD-10-CM

## 2021-07-12 DIAGNOSIS — D0362 Melanoma in situ of left upper limb, including shoulder: Secondary | ICD-10-CM | POA: Diagnosis not present

## 2021-07-12 DIAGNOSIS — C4360 Malignant melanoma of unspecified upper limb, including shoulder: Secondary | ICD-10-CM

## 2021-07-12 DIAGNOSIS — D2262 Melanocytic nevi of left upper limb, including shoulder: Secondary | ICD-10-CM

## 2021-07-12 LAB — POCT UA - MICROALBUMIN
Albumin/Creatinine Ratio, Urine, POC: <30
Creatinine, POC: 200 mg/dL
Microalbumin Ur, POC: 30 mg/L

## 2021-07-12 NOTE — Progress Notes (Signed)
   Established Patient Office Visit  Subjective   Patient ID: Janet Mccullough, female    DOB: 08-Oct-1950  Age: 71 y.o. MRN: [id number]  No chief complaint on file.   HPI  Here today for mole removal.  It is on her left forearm just distal to the elbow.  She says its been gradually getting larger.  It is not painful or itchy.  She has not noticed a significant change in color.     ROS    Objective:     BP (!) 111/55   Pulse 82   Ht '5\' 7"'$  (1.702 m)   Wt 180 lb (81.6 kg)   SpO2 97%   BMI 28.19 kg/m     Physical Exam Vitals and nursing note reviewed.  Constitutional:      Appearance: She is well-developed.  HENT:     Head: Normocephalic and atraumatic.  Pulmonary:     Effort: Pulmonary effort is normal.  Skin:    General: Skin is warm and dry.     Comments: Almost heart-shaped lesion on left forearm just distal to the elbow measuring approximately 7 x 9 mm in size.  No scabbing or scaling.  A medium brown color.  Neurological:     Mental Status: She is alert and oriented to person, place, and time.  Psychiatric:        Behavior: Behavior normal.     Results for orders placed or performed in visit on 07/12/21  POCT UA - Microalbumin  Result Value Ref Range   Microalbumin Ur, POC 30 mg/L   Creatinine, POC 200 mg/dL   Albumin/Creatinine Ratio, Urine, POC <30        The 10-year ASCVD risk score (Arnett DK, et al., 2019) is: 14.5%    Assessment & Plan:   Problem List Items Addressed This Visit       Endocrine   Microalbuminuria due to type 2 diabetes mellitus ([place])   Relevant Orders   POCT UA - Microalbumin (Completed)   Other Visit Diagnoses     Atypical nevus    -  Primary   Relevant Orders   Surgical pathology      Shave Biopsy Procedure Note  Pre-operative Diagnosis: Suspicious lesion  Post-operative Diagnosis: same  Locations:left  forearm.   Indications: getting larger   Anesthesia: Lidocaine 1% with epinephrine without added sodium  bicarbonate  Procedure Details  History of allergy to iodine: no  Patient informed of the risks (including bleeding and infection) and benefits of the  procedure and Verbal informed consent obtained.  The lesion and surrounding area were given a sterile prep using chlorhexidine and draped in the usual sterile fashion. A scalpel was used to shave an area of skin approximately 16m by 159m  Hemostasis achieved with alumuninum chloride. Antibiotic ointment and a sterile dressing applied.  The specimen was sent for pathologic examination. The patient tolerated the procedure well.  EBL: trace  Findings: Await pathology  Condition: Stable  Complications: none.  Plan: 1. Instructed to keep the wound dry and covered for 24-48h and clean thereafter. 2. Warning signs of infection were reviewed.   3. Recommended that the patient use OTC acetaminophen as needed for pain.  4. Return PRN   Return if symptoms worsen or fail to improve.    CaBeatrice LecherMD

## 2021-07-12 NOTE — Patient Instructions (Addendum)
Apply Vaseline to area twice a day.  Do not have to keep covered and left you are going to get into something particularly dirty or dusty. Pathology report should be back in about a week.

## 2021-07-16 DIAGNOSIS — C4360 Malignant melanoma of unspecified upper limb, including shoulder: Secondary | ICD-10-CM | POA: Insufficient documentation

## 2021-07-16 DIAGNOSIS — Z8589 Personal history of malignant neoplasm of other organs and systems: Secondary | ICD-10-CM | POA: Insufficient documentation

## 2021-07-16 NOTE — Progress Notes (Signed)
Called pt and review results. Will place dermatology referral.   Orders Placed This Encounter     Ambulatory referral to Dermatology         Referral Priority:Routine         Referral Type:Consultation         Referral Reason:Specialty Services Required         Requested Specialty:Dermatology         Number of Visits Requested:1

## 2021-07-16 NOTE — Addendum Note (Signed)
Addended by: Beatrice Lecher D on: 07/16/2021 09:39 AM   Modules accepted: Orders

## 2021-07-21 DIAGNOSIS — C4490 Unspecified malignant neoplasm of skin, unspecified: Secondary | ICD-10-CM | POA: Diagnosis not present

## 2021-07-27 DIAGNOSIS — D0362 Melanoma in situ of left upper limb, including shoulder: Secondary | ICD-10-CM | POA: Diagnosis not present

## 2021-08-03 DIAGNOSIS — L905 Scar conditions and fibrosis of skin: Secondary | ICD-10-CM | POA: Diagnosis not present

## 2021-08-10 DIAGNOSIS — D225 Melanocytic nevi of trunk: Secondary | ICD-10-CM | POA: Diagnosis not present

## 2021-08-10 DIAGNOSIS — L821 Other seborrheic keratosis: Secondary | ICD-10-CM | POA: Diagnosis not present

## 2021-08-10 DIAGNOSIS — L570 Actinic keratosis: Secondary | ICD-10-CM | POA: Diagnosis not present

## 2021-08-10 DIAGNOSIS — D239 Other benign neoplasm of skin, unspecified: Secondary | ICD-10-CM | POA: Diagnosis not present

## 2021-08-10 DIAGNOSIS — X32XXXS Exposure to sunlight, sequela: Secondary | ICD-10-CM | POA: Diagnosis not present

## 2021-08-10 DIAGNOSIS — Z85820 Personal history of malignant melanoma of skin: Secondary | ICD-10-CM | POA: Diagnosis not present

## 2021-08-10 DIAGNOSIS — L814 Other melanin hyperpigmentation: Secondary | ICD-10-CM | POA: Diagnosis not present

## 2021-09-20 ENCOUNTER — Encounter: Payer: Self-pay | Admitting: Family Medicine

## 2021-09-20 ENCOUNTER — Ambulatory Visit (INDEPENDENT_AMBULATORY_CARE_PROVIDER_SITE_OTHER): Payer: Medicare HMO | Admitting: Family Medicine

## 2021-09-20 VITALS — BP 130/65 | HR 83 | Wt 181.0 lb

## 2021-09-20 DIAGNOSIS — E1169 Type 2 diabetes mellitus with other specified complication: Secondary | ICD-10-CM | POA: Diagnosis not present

## 2021-09-20 DIAGNOSIS — E1165 Type 2 diabetes mellitus with hyperglycemia: Secondary | ICD-10-CM

## 2021-09-20 DIAGNOSIS — C4362 Malignant melanoma of left upper limb, including shoulder: Secondary | ICD-10-CM

## 2021-09-20 LAB — POCT GLYCOSYLATED HEMOGLOBIN (HGB A1C)
Hemoglobin A1C: 8.2 % — AB (ref 4.0–5.6)
Hemoglobin A1C: 8.3 % — AB (ref 4.0–5.6)

## 2021-09-20 MED ORDER — DAPAGLIFLOZIN PRO-METFORMIN ER 5-1000 MG PO TB24: 1.0000 | ORAL_TABLET | Freq: Every day | ORAL | 0 refills | Status: DC

## 2021-09-20 NOTE — Assessment & Plan Note (Signed)
Discussed options.  Since A1c greater than 7 we will go ahead and switch to a combination pill we will do dapagliflozin/metformin 05/998 if she tolerates it well then we can try adjusting the dose to 10/998.  Follow-up in 3 months.  Continue work on [employer] and regular exercise.

## 2021-09-20 NOTE — Progress Notes (Signed)
Established Patient Office Visit  Subjective   Patient ID: Janet Mccullough, female    DOB: 02/28/1950  Age: 71 y.o. MRN: [id number]  Chief Complaint  Patient presents with   Diabetes    HPI  Diabetes - no hypoglycemic events. No wounds or sores that are not healing well. No increased thirst or urination. Checking glucose at home. Taking medications as prescribed without any side effects.  She reports for the last 2 weeks her blood sugars have been running in the 200s she cannot quite pinpoint what it is she tried limiting a couple things from her diet it does not really seem to make a difference she said about a week ago she felt extra tired for a few days and thought maybe she was getting sick but she feels better now and nothing different happened.     ROS    Objective:     BP 130/65   Pulse 83   Wt 181 lb (82.1 kg)   SpO2 96%   BMI 28.35 kg/m    Physical Exam Vitals and nursing note reviewed.  Constitutional:      Appearance: She is well-developed.  HENT:     Head: Normocephalic and atraumatic.  Cardiovascular:     Rate and Rhythm: Normal rate and regular rhythm.     Heart sounds: Normal heart sounds.  Pulmonary:     Effort: Pulmonary effort is normal.     Breath sounds: Normal breath sounds.  Skin:    General: Skin is warm and dry.  Neurological:     Mental Status: She is alert and oriented to person, place, and time.  Psychiatric:        Behavior: Behavior normal.      Results for orders placed or performed in visit on 09/20/21  POCT glycosylated hemoglobin (Hb A1C)  Result Value Ref Range   Hemoglobin A1C 8.3 (A) 4.0 - 5.6 %   HbA1c POC (<> result, manual entry)     HbA1c, POC (prediabetic range)     HbA1c, POC (controlled diabetic range)    POCT glycosylated hemoglobin (Hb A1C)  Result Value Ref Range   Hemoglobin A1C 8.2 (A) 4.0 - 5.6 %   HbA1c POC (<> result, manual entry)     HbA1c, POC (prediabetic range)     HbA1c, POC (controlled diabetic  range)        The 10-year ASCVD risk score (Arnett DK, et al., 2019) is: 19.3%    Assessment & Plan:   Problem List Items Addressed This Visit       Endocrine   Type 2 diabetes mellitus with other specified complication ([place]) - Primary    Discussed options.  Since A1c greater than 7 we will go ahead and switch to a combination pill we will do dapagliflozin/metformin 05/998 if she tolerates it well then we can try adjusting the dose to 10/998.  Follow-up in 3 months.  Continue work on [employer] and regular exercise.      Relevant Medications   Dapagliflozin-metFORMIN HCl ER 05-998 MG TB24   Other Relevant Orders   POCT glycosylated hemoglobin (Hb A1C) (Completed)   POCT glycosylated hemoglobin (Hb A1C) (Completed)     Other   Malignant melanoma of upper extremity, including shoulder ([place])    He did well with full excision and the incision is healing very well.  She does have a follow-up in November with dermatology.      Other Visit Diagnoses  Type 2 diabetes mellitus with hyperglycemia, without long-term current use of insulin ([facility])       Relevant Medications   Dapagliflozin-metFORMIN HCl ER 05-998 MG TB24       Really do not have a great explanation for why her blood sugars have jumped up significantly in the last couple weeks it does not sound like she has had any major changes in diet or activity etc. she may be had a week where she felt a little bit more tired but says that seems to have resolved we will just do some extra labs today just to rule out inflammation or anemia or possible infection.  In the meantime we will go ahead and adjust her medication regimen.  Return in about 3 months (around 12/20/2021) for Diabetes follow-up.    Beatrice Lecher, MD

## 2021-09-20 NOTE — Assessment & Plan Note (Signed)
He did well with full excision and the incision is healing very well.  She does have a follow-up in November with dermatology.

## 2021-09-22 ENCOUNTER — Encounter: Payer: Self-pay | Admitting: Family Medicine

## 2021-09-29 ENCOUNTER — Other Ambulatory Visit: Payer: Self-pay | Admitting: Family Medicine

## 2021-09-29 DIAGNOSIS — Z1231 Encounter for screening mammogram for malignant neoplasm of breast: Secondary | ICD-10-CM

## 2021-10-07 MED ORDER — RYBELSUS 3 MG PO TABS: 3.0000 mg | ORAL_TABLET | Freq: Every day | ORAL | 1 refills | Status: DC

## 2021-10-07 NOTE — Addendum Note (Signed)
Addended by: Beatrice Lecher D on: 10/07/2021 01:36 PM   Modules accepted: Orders

## 2021-10-12 ENCOUNTER — Telehealth: Payer: Medicare HMO

## 2021-10-14 ENCOUNTER — Ambulatory Visit (INDEPENDENT_AMBULATORY_CARE_PROVIDER_SITE_OTHER): Payer: Medicare HMO | Admitting: Pharmacist

## 2021-10-14 DIAGNOSIS — E785 Hyperlipidemia, unspecified: Secondary | ICD-10-CM

## 2021-10-14 DIAGNOSIS — E1169 Type 2 diabetes mellitus with other specified complication: Secondary | ICD-10-CM

## 2021-10-14 NOTE — Progress Notes (Signed)
Chronic Care Management Pharmacy Note  10/14/2021 Name:  Janet Mccullough MRN:  [id number] DOB:  July 23, 1950  Summary: addressed HLD, and primarily DM.  Patient picked up rybelsus, but has not started yet.  She did not pick up xigduo as she was uncomfortable with research of side effects.           Patient declines statin.     No other medication changes, per patient report.  Recommendations/Changes made from today's visit: none  Provided discussion and counseling regarding GLP1, encouraged patient to begin taking rybelsus.  Will facilitate with PCP regarding discontinue of xigduo RX, and re-entry of metformin RX (without combination).   Plan: f/u with pharmacist in 1 month  Subjective: Janet Mccullough is an 71 y.o. year old female who is a primary patient of Metheney, Rene Kocher, MD.  The CCM team was consulted for assistance with disease management and care coordination needs.    Engaged with patient by telephone for follow up visit in response to provider referral for pharmacy case management and/or care coordination services.   Consent to Services:  The patient was given information about Chronic Care Management services, agreed to services, and gave verbal consent prior to initiation of services.  Please see initial visit note for detailed documentation.   Patient Care Team: Hali Marry, MD as PCP - General (Family Medicine) Darius Bump, [facility] as Pharmacist (Pharmacist)  Recent office visits:  08/24/20-Catherine D. Madilyn Fireman, MD (PCP) Diabetic follow up. Labs ordered. Follow up in 15 weeks. 06/01/20-Catherine D. Madilyn Fireman, MD (PCP) Seen for arm pain. Right shoulder pain-suspect bursitis.  Recommend ice, anti-inflammatory and range of motion exercises given on handout.  05/20/20-Catherine D. Madilyn Fireman, MD (PCP) Medicare annual wellness. Mammogram ordered. 05/19/20-Catherine D. Madilyn Fireman, MD (PCP) Diabetic follow up. Start with metformin 5 mg ER. Labs ordered. Follow up in 3 months.   Recent consult visits:  03/09/20-Brian Kenton [facility]) Notes not  available.   Hospital visits:  None in previous 6 months  Objective:  Lab Results  Component Value Date   CREATININE 0.62 06/14/2021   CREATININE 0.78 12/07/2020   CREATININE 0.72 05/19/2020    Lab Results  Component Value Date   HGBA1C 8.3 (A) 09/20/2021   Last diabetic Eye exam:  Lab Results  Component Value Date/Time   HMDIABEYEEXA No Retinopathy 05/03/2021 12:00 AM    Last diabetic Foot exam: No results found for: "HMDIABFOOTEX"      Component Value Date/Time   CHOL 196 12/07/2020 1033   TRIG 101 12/07/2020 1033   HDL 68 12/07/2020 1033   CHOLHDL 2.9 12/07/2020 1033   VLDL 21 04/29/2015 0907   LDLCALC 109 (H) 12/07/2020 1033       Latest Ref Rng & Units 12/07/2020   10:33 AM 11/07/2019   10:03 AM 07/18/2019    4:54 PM  Hepatic Function  Total Protein 6.1 - 8.1 g/dL 7.3  7.2  6.9   AST 10 - 35 U/L _0 ALT 6 - 29 U/L _1 Total Bilirubin 0.2 - 1.2 mg/dL 0.7  0.7  0.5     Lab Results  Component Value Date/Time   TSH 2.60 07/29/2015 09:00 AM   TSH 2.567 05/13/2010 09:03 AM       Latest Ref Rng & Units 12/07/2020   10:33 AM 07/18/2019    4:54 PM 07/29/2015    9:00 AM  CBC  WBC 3.8 - 10.8 Thousand/uL 5.9  6.3  5.3   Hemoglobin 11.7 - 15.5 g/dL 14.4  13.4  14.0   Hematocrit 35.0 - 45.0 % 42.1  38.1  41.2   Platelets 140 - 400 Thousand/uL 260  320  271     Lab Results  Component Value Date/Time   VD25OH 32 07/29/2015 09:00 AM     BP Readings from Last 3 Encounters:  09/20/21 130/65  07/12/21 (!) 111/55  06/14/21 108/68  Pulse Readings from Last 3 Encounters:  09/20/21 83  07/12/21 82  06/14/21 77   Wt Readings from Last 3 Encounters:  09/20/21 181 lb (82.1 kg)  07/12/21 180 lb (81.6 kg)  06/14/21 181 lb (82.1 kg)    Assessment: Review of patient past medical history, allergies, medications, health status, including review of consultants reports, laboratory and other test data, was performed as part of comprehensive  evaluation and provision of chronic care management services.   SDOH:  (Social Determinants of Health) assessments and interventions performed:  SDOH Interventions    Flowsheet Row Office Visit from 05/20/2020 in [place] Office Visit from 08/07/2019 in [place] Interventions Intervention Not Indicated --  Housing Interventions Intervention Not Indicated --  Transportation Interventions Intervention Not Indicated --  Depression Interventions/Treatment  -- Counseling  Financial Strain Interventions Intervention Not Indicated --  Physical Activity Interventions Intervention Not Indicated --  Stress Interventions Intervention Not Indicated --  Social Connections Interventions Intervention Not Indicated --       CCM Care Plan  Allergies  Allergen Reactions   Moxifloxacin Hcl In Nacl Hives   Penicillins Hives    REACTION: unknown rx   Quinolones Hives    In particular Avelox    Medications Reviewed Today     Reviewed by Hali Marry, MD (Physician) on 09/20/21 at [phone]  Med List Status: <None>   Medication Order Taking? Sig Documenting Provider Last Dose Status Informant  AMBULATORY NON FORMULARY MEDICATION [id number] Yes Take 1 tablet by mouth daily. Medication Name:  Berberine supplement [provider] Taking Active Self  AMBULATORY NON FORMULARY MEDICATION [id number] Yes Take 1 capsule by mouth in the morning and at bedtime. Medication Name: BONE BUILDING SUPPLEMENT [provider] Taking Active Self  B Complex Vitamins (B-COMPLEX/B-12 SL) [id number] Yes Place 1 tablet under the tongue daily. [provider] Taking Active   Blood Glucose Monitoring Suppl (ONE TOUCH ULTRA SYSTEM KIT) W/DEVICE KIT [id number] Yes 1 kit by Does not apply route once. Lancets, test strips for testing 3 times a week. Dx: IFG Dx code: M84.13 Hali Marry, MD Taking Active   Cholecalciferol (VITAMIN D3) 125 MCG (5000 UT) CAPS [id number] Yes Take 1 capsule by mouth daily. 174mg (7500 units) per integrative medicine [provider] Taking Active   glucose blood (ONE TOUCH ULTRA TEST) test strip [id number] Dx: E11.9. Check fasting blood sugar daily. MHali Marry MD Taking Active   Lancets (ONETOUCH DELICA PLUS [id number] [id number] USE 1  TO CHECK GLUCOSE IN THE MORNING MHali Marry MD Taking Active   MAGNESIUM PO [id number] Take 1,350 mg by mouth daily. Mg Maleate (3 capsule daily) [provider] Taking Active   Misc Natural Products (TURMERIC CURCUMIN) CAPS [id number] Take 1 capsule by mouth daily. 755mcapsule [provider] Taking Active   Vitamin Mixture (VITAMIN C) LIQD [id number] Take 1,000 mg by mouth daily. [provider] Taking Active             Patient Active Problem List   Diagnosis Date Noted   Malignant melanoma of upper extremity, including shoulder (HCTowaoc07/07/2021   History of squamous cell carcinoma 07/16/2021   History of COVID-19 02/20/2020   Microalbuminuria due to type 2 diabetes mellitus (HCDaykin06/30/2020   Ductal carcinoma (HCCidra11/26/2018   Coccydynia 09/21/2016   Osteopenia 10/29/2013  VASOMOTOR RHINITIS 06/16/2008   HAY FEVER 04/14/2008   HEMATURIA 04/24/2006   Type 2 diabetes mellitus with other specified complication ([place]) 78/24/2353   History of breast cancer 10/18/2005   MENOPAUSAL SYNDROME 10/18/2005    Immunization History  Administered Date(s) Administered   Td 01/10/2002    Conditions to be addressed/monitored: HLD and DMII  There are no care plans that you recently modified to display for this patient.    Medication Assistance: None required.  Patient affirms current coverage meets needs.  Patient's preferred pharmacy is:  [place], [place] - [place] [address] [place] [address] Phone: [phone] Fax: [phone]   Uses pill box? Yes Pt endorses 100% compliance  Follow Up:  Patient agrees to Care Plan and Follow-up.  Plan: Telephone follow up appointment with care management team member scheduled for:  1 months  Larinda Buttery, PharmD Clinical Pharmacist [facility] [phone]

## 2021-10-15 NOTE — Patient Instructions (Signed)
Janet Mccullough,  It was a pleasure chatting! Remember to start taking your rybelsus '3mg'$  daily.  Have a great weekend,  [facility]

## 2021-10-18 MED ORDER — METFORMIN HCL ER (OSM) 500 MG PO TB24: 1000.0000 mg | ORAL_TABLET | Freq: Every day | ORAL | 1 refills | Status: DC

## 2021-10-18 NOTE — Progress Notes (Signed)
Meds ordered this encounter  Medications   metformin (FORTAMET) 500 MG (OSM) 24 hr tablet    Sig: Take 2 tablets (1,000 mg total) by mouth daily with breakfast.    Dispense:  180 tablet    Refill:  1   D/C's Xigduo  Orders Placed This Encounter  Procedures   AMB Referral to Pharmacy Medication Management    Referral Priority:   Routine    Referral Type:   Consultation    Referral Reason:   Pharmacy Medication Management    Number of Visits Requested:   1   Beatrice Lecher, MD

## 2021-10-18 NOTE — Addendum Note (Signed)
Addended by: Beatrice Lecher D on: 10/18/2021 10:29 AM   Modules accepted: Orders

## 2021-10-20 ENCOUNTER — Telehealth: Payer: Self-pay

## 2021-10-20 NOTE — Telephone Encounter (Signed)
Initiated Prior authorization KRC:[id number] HCl ER (OSM) '500MG'$  er tablets  Via: Covermymeds Case/Key:[id number] Status: approved  as of 10/20/21 Reason:This approval authorizes your coverage from 01/10/2021 - 01/09/2022 Notified Pt via: Mychart

## 2021-10-29 ENCOUNTER — Telehealth: Payer: Self-pay

## 2021-10-29 DIAGNOSIS — E119 Type 2 diabetes mellitus without complications: Secondary | ICD-10-CM

## 2021-10-29 MED ORDER — GLUCOSE BLOOD VI STRP: ORAL_STRIP | 11 refills | Status: DC

## 2021-10-29 MED ORDER — ONETOUCH DELICA PLUS LANCET33G MISC: 99 refills | Status: DC

## 2021-11-02 NOTE — Telephone Encounter (Signed)
error 

## 2021-11-10 DIAGNOSIS — L570 Actinic keratosis: Secondary | ICD-10-CM | POA: Diagnosis not present

## 2021-11-10 DIAGNOSIS — L821 Other seborrheic keratosis: Secondary | ICD-10-CM | POA: Diagnosis not present

## 2021-11-10 DIAGNOSIS — Z85820 Personal history of malignant melanoma of skin: Secondary | ICD-10-CM | POA: Diagnosis not present

## 2021-11-10 DIAGNOSIS — E663 Overweight: Secondary | ICD-10-CM | POA: Diagnosis not present

## 2021-11-10 DIAGNOSIS — L814 Other melanin hyperpigmentation: Secondary | ICD-10-CM | POA: Diagnosis not present

## 2021-11-10 DIAGNOSIS — D1801 Hemangioma of skin and subcutaneous tissue: Secondary | ICD-10-CM | POA: Diagnosis not present

## 2021-11-10 DIAGNOSIS — D225 Melanocytic nevi of trunk: Secondary | ICD-10-CM | POA: Diagnosis not present

## 2021-11-10 DIAGNOSIS — X32XXXS Exposure to sunlight, sequela: Secondary | ICD-10-CM | POA: Diagnosis not present

## 2021-11-17 ENCOUNTER — Ambulatory Visit (INDEPENDENT_AMBULATORY_CARE_PROVIDER_SITE_OTHER): Payer: Medicare HMO | Admitting: Pharmacist

## 2021-11-17 DIAGNOSIS — E1169 Type 2 diabetes mellitus with other specified complication: Secondary | ICD-10-CM

## 2021-11-17 DIAGNOSIS — E785 Hyperlipidemia, unspecified: Secondary | ICD-10-CM

## 2021-11-17 NOTE — Progress Notes (Signed)
Chronic Care Management Pharmacy Note  11/17/2021 Name:  Janet Mccullough MRN:  [id number] DOB:  12/24/1950  Summary: addressed HLD, and primarily DM. Patient does not notice any drastic side effects with rybelsus. She does notice feeling full more quickly She is on Rybelsus 44m daily, and states her BG readings are about 20 points lower. BG readings at home: 140, 146, 159, 134 Patient states she is attempting to be more active, and notices more energy.       Patient declines statin.  Recommendations/Changes made from today's visit:  - Recommend patient increase to rybelsus 44m daily at next PCP visit. - Counseled patient on options for patient assistance if she ever feels rybelsus is not affordable (current cost ~$47 monthly which she is comfortable with).  Plan: f/u with pharmacist in 3-6 month  Subjective: Janet Pedigois an 71y.o. year old female who is a primary patient of Metheney, CRene Kocher MD.  The CCM team was consulted for assistance with disease management and care coordination needs.    Engaged with patient by telephone for follow up visit in response to provider referral for pharmacy case management and/or care coordination services.   Consent to Services:  The patient was given information about Chronic Care Management services, agreed to services, and gave verbal consent prior to initiation of services.  Please see initial visit note for detailed documentation.   Patient Care Team: MHali Marry MD as PCP - General (Family Medicine) KDarius [facility]ump [facility] Pharmacist (Pharmacist)  Recent office visits:  08/24/20-Janet D. MMadilyn Fireman MD (PCP) Diabetic follow up. Labs ordered. Follow up in 15 weeks. 06/01/20-Janet D. MMadilyn Fireman MD (PCP) Seen for arm pain. Right shoulder pain-suspect bursitis.  Recommend ice, anti-inflammatory and range of motion exercises given on handout.  05/20/20-Janet D. MMadilyn Fireman MD (PCP) Medicare annual wellness. Mammogram ordered. 05/19/20-Janet D. MMadilyn Fireman MD (PCP) Diabetic follow up. Start with metformin 5  mg ER. Labs ordered. Follow up in 3 months.   Recent consult visits:  03/09/20-Janet Mccullough([facility] Notes not available.   Hospital visits:  None in previous 6 months  Objective:  Lab Results  Component Value Date   CREATININE 0.62 06/14/2021   CREATININE 0.78 12/07/2020   CREATININE 0.72 05/19/2020    Lab Results  Component Value Date   HGBA1C 8.3 (A) 09/20/2021   Last diabetic Eye exam:  Lab Results  Component Value Date/Time   HMDIABEYEEXA No Retinopathy 05/03/2021 12:00 AM    Last diabetic Foot exam: No results found for: "HMDIABFOOTEX"      Component Value Date/Time   CHOL 196 12/07/2020 1033   TRIG 101 12/07/2020 1033   HDL 68 12/07/2020 1033   CHOLHDL 2.9 12/07/2020 1033   VLDL 21 04/29/2015 0907   LDLCALC 109 (H) 12/07/2020 1033       Latest Ref Rng & Units 12/07/2020   10:33 AM 11/07/2019   10:03 AM 07/18/2019    4:54 PM  Hepatic Function  Total Protein 6.1 - 8.1 g/dL 7.3  7.2  6.9   AST 10 - 35 U/L _0 ALT 6 - 29 U/L _1 Total Bilirubin 0.2 - 1.2 mg/dL 0.7  0.7  0.5     Lab Results  Component Value Date/Time   TSH 2.60 07/29/2015 09:00 AM   TSH 2.567 05/13/2010 09:03 AM       Latest Ref Rng & Units 12/07/2020   10:33 AM 07/18/2019    4:54 PM 07/29/2015    9:00 AM  CBC  WBC 3.8 - 10.8 Thousand/uL 5.9  6.3  5.3   Hemoglobin 11.7 - 15.5 g/dL 14.4  13.4  14.0   Hematocrit 35.0 - 45.0 % 42.1  38.1  41.2   Platelets 140 - 400 Thousand/uL 260  320  271     Lab Results  Component Value Date/Time   VD25OH 32 07/29/2015 09:00 AM     BP Readings from Last 3 Encounters:  09/20/21 130/65  07/12/21 (Marland Kitchen  111/55  06/14/21 108/68   Pulse Readings from Last 3 Encounters:  09/20/21 83  07/12/21 82  06/14/21 77   Wt Readings from Last 3 Encounters:  09/20/21 181 lb (82.1 kg)  07/12/21 180 lb (81.6 kg)  06/14/21 181 lb (82.1 kg)    Assessment: Review of patient past medical history, allergies, medications, health status,  including review of consultants reports, laboratory and other test data, was performed as part of comprehensive evaluation and provision of chronic care management services.   SDOH:  (Social Determinants of Health) assessments and interventions performed:  SDOH Interventions    Flowsheet Row Office Visit from 05/20/2020 in [place] Office Visit from 08/07/2019 in [place] Interventions Intervention Not Indicated --  Housing Interventions Intervention Not Indicated --  Transportation Interventions Intervention Not Indicated --  Depression Interventions/Treatment  -- Counseling  Financial Strain Interventions Intervention Not Indicated --  Physical Activity Interventions Intervention Not Indicated --  Stress Interventions Intervention Not Indicated --  Social Connections Interventions Intervention Not Indicated --       CCM Care Plan  Allergies  Allergen Reactions   Moxifloxacin Hcl In Nacl Hives   Penicillins Hives    REACTION: unknown rx   Quinolones Hives    In particular Avelox    Medications Reviewed Today     Reviewed by Darius Bump, [facility] (Pharmacist) on 10/14/21 at 1336  Med List Status: <None>   Medication Order Taking? Sig Documenting Provider Last Dose Status Informant  AMBULATORY NON FORMULARY MEDICATION [id number] No Take 1 tablet by mouth daily. Medication Name:  Berberine supplement [provider] Taking Active Self  AMBULATORY NON FORMULARY MEDICATION [id number] No Take 1 capsule by mouth in the morning and at bedtime. Medication Name: BONE BUILDING SUPPLEMENT [provider] Taking Active Self  B Complex Vitamins (B-COMPLEX/B-12 SL) [id number] No Place 1 tablet under the tongue daily. [provider] Taking Active   Blood Glucose Monitoring Suppl (ONE TOUCH ULTRA SYSTEM KIT) W/DEVICE KIT [id number] No 1 kit by Does not apply  route once. Lancets, test strips for testing 3 times a week. Dx: IFG Dx code: Z22.48 Hali Marry, MD Taking Active   Cholecalciferol (VITAMIN D3) 125 MCG (5000 UT) CAPS [id number] No Take 1 capsule by mouth daily. 182mg (7500 units) per integrative medicine [provider] Taking Active   Dapagliflozin-metFORMIN HCl ER 05-998 MG TB24 [id number] Take 1 tablet by mouth daily. MHali Marry MD  Active   glucose blood (ONE TOUCH ULTRA TEST) test strip [id number] Dx: E11.9. Check fasting blood sugar daily. MHali Marry MD Taking Active   Lancets (ONETOUCH DELICA PLUS [id number] [id number] USE 1  TO CHECK GLUCOSE IN THE MORNING MHali Marry MD Taking Active   MAGNESIUM PO [id number] Take 1,350 mg by mouth daily. Mg Maleate (3 capsule daily) [provider] Taking Active   Misc Natural Products (TURMERIC CURCUMIN) CAPS [id number] Take 1 capsule by mouth daily. 7567mcapsule [provider] Taking Active   Semaglutide (RYBELSUS) 3 MG TABS [id number] 3 mg by mouth daily. MeHali MarryMD  Active   Vitamin Mixture (VITAMIN C) LIQD [id number] Take 1,000 mg by mouth daily. [provider] Taking Active             Patient Active Problem List   Diagnosis Date Noted  Malignant melanoma of upper extremity, including shoulder ([place]) 07/16/2021   History of squamous cell carcinoma 07/16/2021   History of COVID-19 02/20/2020   Microalbuminuria due to type 2 diabetes mellitus ([place]) 07/10/2018   Ductal carcinoma ([place]) 12/05/2016   Coccydynia 09/21/2016   Osteopenia 10/29/2013   VASOMOTOR RHINITIS 06/16/2008   HAY FEVER 04/14/2008   HEMATURIA 04/24/2006   Type 2 diabetes mellitus with other specified complication ([place]) 03/10/3141   History of breast cancer 10/18/2005   MENOPAUSAL SYNDROME 10/18/2005    Immunization History  Administered Date(s) Administered   Td 01/10/2002    Conditions to be  addressed/monitored: HLD and DMII   Medication Assistance: None required.  Patient affirms current coverage meets needs.  Patient's preferred pharmacy is:  [place], [place] - [place] [address] [place] [address] Phone: [phone] Fax: [phone]   Uses pill box? Yes Pt endorses 100% compliance  Follow Up:  Patient agrees to Care Plan and Follow-up.  Plan: Telephone follow up appointment with care management team member scheduled for:  3-6 months  Larinda Buttery, PharmD Clinical Pharmacist [facility] [phone]

## 2021-11-25 ENCOUNTER — Ambulatory Visit (INDEPENDENT_AMBULATORY_CARE_PROVIDER_SITE_OTHER): Payer: Medicare HMO

## 2021-11-25 DIAGNOSIS — Z1231 Encounter for screening mammogram for malignant neoplasm of breast: Secondary | ICD-10-CM

## 2021-11-29 ENCOUNTER — Other Ambulatory Visit: Payer: Self-pay | Admitting: Family Medicine

## 2021-11-30 NOTE — Progress Notes (Signed)
Please call patient. Normal mammogram.  Repeat in 1 year.  

## 2021-12-01 ENCOUNTER — Encounter: Payer: Self-pay | Admitting: Family Medicine

## 2021-12-01 DIAGNOSIS — E1169 Type 2 diabetes mellitus with other specified complication: Secondary | ICD-10-CM

## 2021-12-01 MED ORDER — RYBELSUS 7 MG PO TABS: 7.0000 mg | ORAL_TABLET | Freq: Every day | ORAL | 0 refills | Status: DC

## 2021-12-01 NOTE — Telephone Encounter (Signed)
Meds ordered this encounter  Medications   Semaglutide (RYBELSUS) 7 MG TABS    Sig: Take 7 mg by mouth daily.    Dispense:  30 tablet    Refill:  0   HIgher dose sent

## 2021-12-03 ENCOUNTER — Other Ambulatory Visit: Payer: Self-pay | Admitting: Family Medicine

## 2021-12-03 DIAGNOSIS — E1165 Type 2 diabetes mellitus with hyperglycemia: Secondary | ICD-10-CM

## 2021-12-20 ENCOUNTER — Encounter: Payer: Self-pay | Admitting: Family Medicine

## 2021-12-20 ENCOUNTER — Ambulatory Visit (INDEPENDENT_AMBULATORY_CARE_PROVIDER_SITE_OTHER): Payer: Medicare HMO | Admitting: Family Medicine

## 2021-12-20 VITALS — BP 125/58 | HR 81 | Ht 67.0 in | Wt 180.0 lb

## 2021-12-20 DIAGNOSIS — E1169 Type 2 diabetes mellitus with other specified complication: Secondary | ICD-10-CM | POA: Diagnosis not present

## 2021-12-20 DIAGNOSIS — H6992 Unspecified Eustachian tube disorder, left ear: Secondary | ICD-10-CM | POA: Diagnosis not present

## 2021-12-20 DIAGNOSIS — E785 Hyperlipidemia, unspecified: Secondary | ICD-10-CM | POA: Diagnosis not present

## 2021-12-20 LAB — POCT GLYCOSYLATED HEMOGLOBIN (HGB A1C): Hemoglobin A1C: 7.9 % — AB (ref 4.0–5.6)

## 2021-12-20 NOTE — Assessment & Plan Note (Signed)
A1c is improving down to 7.9.  She just went up to the 7 mg Rybelsus about a week ago.  Will plan to go up to 14 mg in the next couple of months otherwise I will see her back in 3 months.  Work on [employer], portion control and regular exercise.

## 2021-12-20 NOTE — Progress Notes (Signed)
Established Patient Office Visit  Subjective   Patient ID: Janet Mccullough, female    DOB: 01/15/1950  Age: 72 y.o. MRN: [id number]  Chief Complaint  Patient presents with   Diabetes    HPI   Diabetes - no hypoglycemic events. No wounds or sores that are not healing well. No increased thirst or urination. Checking glucose at home. Taking medications as prescribed without any side effects.  Home sugars have been running in the 120s to 130s.  Though she did recently have a 116.  Went up to the 7 mg Rybelsus about a week ago.  She has noticed a little occasional reflux here and there which she does not normally have.  Due for labs today.   She also has had some pressure and popping in that left ear.  It has been going on on and off for a little while.  She did take Claritin for a short period of time.  She would also like to have her liver enzymes rechecked.    ROS    Objective:     BP (!) 125/58   Pulse 81   Ht '5\' 7"'$  (1.702 m)   Wt 180 lb (81.6 kg)   SpO2 98%   BMI 28.19 kg/m    Physical Exam Vitals and nursing note reviewed.  Constitutional:      Appearance: She is well-developed.  HENT:     Head: Normocephalic and atraumatic.     Right Ear: Tympanic membrane and ear canal normal.     Left Ear: Tympanic membrane and ear canal normal.     Ears:     Comments: There are some fluid bubbles behind the left TM Cardiovascular:     Rate and Rhythm: Normal rate and regular rhythm.     Heart sounds: Normal heart sounds.  Pulmonary:     Effort: Pulmonary effort is normal.     Breath sounds: Normal breath sounds.  Skin:    General: Skin is warm and dry.  Neurological:     Mental Status: She is alert and oriented to person, place, and time.  Psychiatric:        Behavior: Behavior normal.      Results for orders placed or performed in visit on 12/20/21  POCT glycosylated hemoglobin (Hb A1C)  Result Value Ref Range   Hemoglobin A1C 7.9 (A) 4.0 - 5.6 %   HbA1c POC  (<> result, manual entry)     HbA1c, POC (prediabetic range)     HbA1c, POC (controlled diabetic range)        The 10-year ASCVD risk score (Arnett DK, et al., 2019) is: 18%    Assessment & Plan:   Problem List Items Addressed This Visit       Endocrine   Type 2 diabetes mellitus with other specified complication ([place]) - Primary    A1c is improving down to 7.9.  She just went up to the 7 mg Rybelsus about a week ago.  Will plan to go up to 14 mg in the next couple of months otherwise I will see her back in 3 months.  Work on [employer], portion control and regular exercise.      Relevant Orders   POCT glycosylated hemoglobin (Hb A1C) (Completed)   COMPLETE METABOLIC PANEL WITH GFR   Lipid Panel w/reflex Direct LDL   CBC   Other Visit Diagnoses     Hyperlipidemia associated with type 2 diabetes mellitus ([place])  Relevant Orders   COMPLETE METABOLIC PANEL WITH GFR   Lipid Panel w/reflex Direct LDL   CBC   Acute dysfunction of left eustachian tube           Eustachian tube dysfunction-recommend a trial of either Claritin or nasal steroid spray but it can take weeks to start to notice improvement.  Return in about 3 months (around 03/29/2022) for Diabetes follow-up.    Beatrice Lecher, MD

## 2021-12-21 LAB — COMPLETE METABOLIC PANEL WITH GFR
AG Ratio: 1.7 (calc) (ref 1.0–2.5)
ALT: 18 U/L (ref 6–29)
AST: 11 U/L (ref 10–35)
Albumin: 4.4 g/dL (ref 3.6–5.1)
Alkaline phosphatase (APISO): 52 U/L (ref 37–153)
BUN: 14 mg/dL (ref 7–25)
CO2: 28 mmol/L (ref 20–32)
Calcium: 9.6 mg/dL (ref 8.6–10.4)
Chloride: 104 mmol/L (ref 98–110)
Creat: 0.7 mg/dL (ref 0.60–1.00)
Globulin: 2.6 g/dL (calc) (ref 1.9–3.7)
Glucose, Bld: 157 mg/dL — ABNORMAL HIGH (ref 65–99)
Potassium: 4.5 mmol/L (ref 3.5–5.3)
Sodium: 141 mmol/L (ref 135–146)
Total Bilirubin: 0.5 mg/dL (ref 0.2–1.2)
Total Protein: 7 g/dL (ref 6.1–8.1)
eGFR: 92 mL/min/{1.73_m2} (ref >=60)

## 2021-12-21 LAB — CBC
HCT: 40.7 % (ref 35.0–45.0)
Hemoglobin: 14.3 g/dL (ref 11.7–15.5)
MCH: 31.6 pg (ref 27.0–33.0)
MCHC: 35.1 g/dL (ref 32.0–36.0)
MCV: 90 fL (ref 80.0–100.0)
MPV: 12 fL (ref 7.5–12.5)
Platelets: 273 10*3/uL (ref 140–400)
RBC: 4.52 10*6/uL (ref 3.80–5.10)
RDW: 12.7 % (ref 11.0–15.0)
WBC: 5.9 10*3/uL (ref 3.8–10.8)

## 2021-12-21 LAB — LIPID PANEL W/REFLEX DIRECT LDL
Cholesterol: 178 mg/dL (ref <200)
HDL: 75 mg/dL (ref >=50)
LDL Cholesterol (Calc): 86 mg/dL (calc)
Non-HDL Cholesterol (Calc): 103 mg/dL (calc) (ref <130)
Total CHOL/HDL Ratio: 2.4 (calc) (ref <5.0)
Triglycerides: 84 mg/dL (ref <150)

## 2021-12-21 NOTE — Progress Notes (Signed)
Your lab work is within acceptable range and there are no concerning findings.   ?

## 2022-01-09 ENCOUNTER — Encounter: Payer: Self-pay | Admitting: Family Medicine

## 2022-01-09 DIAGNOSIS — E1169 Type 2 diabetes mellitus with other specified complication: Secondary | ICD-10-CM

## 2022-01-11 NOTE — Telephone Encounter (Signed)
She go back to the lower strength?  Does she need more of the lower strength?.  She can also take a softener daily if she needs to to help with her bowel movements.  Such as Colace.

## 2022-01-12 MED ORDER — RYBELSUS 3 MG PO TABS: 3.0000 mg | ORAL_TABLET | Freq: Every day | ORAL | 1 refills | Status: DC

## 2022-01-12 MED ORDER — METFORMIN HCL ER (OSM) 500 MG PO TB24: 1000.0000 mg | ORAL_TABLET | Freq: Every day | ORAL | 1 refills | Status: DC

## 2022-02-16 ENCOUNTER — Telehealth: Payer: Medicare HMO

## 2022-02-16 ENCOUNTER — Other Ambulatory Visit: Payer: Medicare HMO | Admitting: Pharmacist

## 2022-02-16 NOTE — Progress Notes (Signed)
02/16/2022 Name: Janet Mccullough MRN: [id number] DOB: 1950/10/07    Janet Mccullough is a 72 y.o. year old female who presented for a telephone visit.   They were referred to the pharmacist by their PCP for assistance in managing diabetes and hyperlipidemia.   Subjective:  Care Team: Primary Care Provider: Hali Marry, MD ; Next Scheduled Visit: 03/29/22   Medication Access/Adherence  Current Pharmacy:  [place], [place] - [phone] [facility] [address] [place] [address] Phone: [phone] Fax: [phone]   Patient reports affordability concerns with their medications: No  Patient reports access/transportation concerns to their pharmacy: No  Patient reports adherence concerns with their medications:  No     Diabetes:  Current medications:   metformin '1000mg'$  daily,  rybelsus '3mg'$  daily (prescribed, not currently taking)  Patient describes an event of upper abodminal cramping and pain after restarting her first dose of rybelsus '3mg'$ . Prior use: rybelsus '3mg'$  daily x 60 days through Oct & Nov, and then Rybelsus '7mg'$  daily for about 2 weeks but discontinued due to nausea and constipation.  Current glucose readings: 130-140s fasting, 170-200 Using traditional meter; testing 1-2 times daily  Patient denies hypoglycemic s/sx including dizziness, shakiness, sweating. Patient denies hyperglycemic symptoms including polyuria, polydipsia, polyphagia, nocturia, neuropathy, blurred vision.  Current meal patterns: recently tightened up her food choices, aiming for mediterranean style diet  Current physical activity: elastic bands, walking, but limited due to time constraints   Hyperlipidemia/ASCVD Risk Reduction  Current lipid lowering medications: none at present, patient declines   Objective:  Lab Results  Component Value Date   HGBA1C 7.9 (A) 12/20/2021    Lab Results  Component Value Date   CREATININE 0.70  12/20/2021   BUN 14 12/20/2021   NA 141 12/20/2021   K 4.5 12/20/2021   CL 104 12/20/2021   CO2 28 12/20/2021    Lab Results  Component Value Date   CHOL 178 12/20/2021   HDL 75 12/20/2021   LDLCALC 86 12/20/2021   TRIG 84 12/20/2021   CHOLHDL 2.4 12/20/2021    Medications Reviewed Today     Reviewed by Darius Bump, RPH (Pharmacist) on 02/16/22 at 1351  Med List Status: <None>   Medication Order Taking? Sig Documenting Provider Last Dose Status Informant  AMBULATORY NON FORMULARY MEDICATION [id number] Yes Take 1 capsule by mouth in the morning and at bedtime. Medication Name: BONE BUILDING SUPPLEMENT [provider] Taking Active Self  B Complex Vitamins (B-COMPLEX/B-12 SL) [id number] Yes Place 1 tablet under the tongue daily. [provider] Taking Active   Blood Glucose Monitoring Suppl (ONE TOUCH ULTRA SYSTEM KIT) W/DEVICE KIT [id number] Yes 1 kit by Does not apply route once. Lancets, test strips for testing 3 times a week. Dx: IFG Dx code: G26.94 Hali Marry, MD Taking Active   Cholecalciferol (VITAMIN D3) 125 MCG (5000 UT) CAPS [id number] Yes Take 1 capsule by mouth daily. 177mg (7500 units) per integrative medicine [provider] Taking Active   glucose blood (ONE TOUCH ULTRA TEST) test strip [id number] Dx: E11.9. Check fasting blood sugar daily. MHali Marry MD Taking Active   Lancets (ONETOUCH DELICA PLUS [id number] USE 1  TO CHECK GLUCOSE IN THE MORNING MHali Marry MD Taking Active   MAGNESIUM PO [id number] Take 1,350 mg by mouth daily. Mg Maleate (3 capsule daily) [provider] Taking Active   metformin (FORTAMET) 500 MG (OSM) 24 hr tablet  [id number] Yes Take 2 tablets (1,000 mg total) by mouth daily with breakfast. Hali Marry, MD Taking Active   Vitamin Mixture (VITAMIN C) LIQD [id number] Yes Take 1,000 mg by mouth daily. [provider] Taking Active                Assessment/Plan:   Diabetes: - Currently uncontrolled - Reviewed long term cardiovascular and renal outcomes of uncontrolled blood sugar - Reviewed goal A1c, goal fasting, and goal 2 hour post prandial glucose - Reviewed dietary modifications including lean protein, vegetables, fruit - Reviewed lifestyle modifications including:increased physical activity if patient desires more non-pharmacologic approach - Recommend to continue current medications for now, but discussed need for additional glucose control: - Counseled on two options: increase metformin to 2 tablets AM, 1 tablet PM - OR - initiate jardiance '10mg'$  daily with goal to titrate to '25mg'$  daily. - Recommend to check glucose 1-2 times daily   Hyperlipidemia/ASCVD Risk Reduction: - Currently uncontrolled. , goal LDL <70, current 89, however is trending down using just lifestyle modifications - Recommend to continue lifestyle modifications as patient declines medication    Follow Up Plan: provided patient information to reconnect with pharmacist as needed  Larinda Buttery, PharmD Clinical Pharmacist [facility] [phone]

## 2022-02-16 NOTE — Patient Instructions (Addendum)
Hi Janet Mccullough,  - Great job on cholesterol heading in the right direction!  - For sugar control, we talked about two options today: 1) increase metformin (This would look like 2 tablets AM, 1 tablet PM) OR 2) Begin jardiance (Dose would be '10mg'$  daily with goal to titrate to '25mg'$  daily.)  Continue to optimize nutrition and exercise to meet your health goals if you prefer to minimize medication, and work with Dr. Madilyn Fireman on these above considerations at your next appointment.   Take care, Luana Shu, PharmD Clinical Pharmacist [facility] [phone]

## 2022-03-16 DIAGNOSIS — D225 Melanocytic nevi of trunk: Secondary | ICD-10-CM | POA: Diagnosis not present

## 2022-03-16 DIAGNOSIS — L814 Other melanin hyperpigmentation: Secondary | ICD-10-CM | POA: Diagnosis not present

## 2022-03-16 DIAGNOSIS — L821 Other seborrheic keratosis: Secondary | ICD-10-CM | POA: Diagnosis not present

## 2022-03-16 DIAGNOSIS — Z85820 Personal history of malignant melanoma of skin: Secondary | ICD-10-CM | POA: Diagnosis not present

## 2022-03-16 DIAGNOSIS — L570 Actinic keratosis: Secondary | ICD-10-CM | POA: Diagnosis not present

## 2022-03-16 DIAGNOSIS — D2271 Melanocytic nevi of right lower limb, including hip: Secondary | ICD-10-CM | POA: Diagnosis not present

## 2022-03-16 DIAGNOSIS — D2272 Melanocytic nevi of left lower limb, including hip: Secondary | ICD-10-CM | POA: Diagnosis not present

## 2022-03-29 ENCOUNTER — Encounter: Payer: Self-pay | Admitting: Family Medicine

## 2022-03-29 ENCOUNTER — Ambulatory Visit (INDEPENDENT_AMBULATORY_CARE_PROVIDER_SITE_OTHER): Payer: Medicare HMO | Admitting: Family Medicine

## 2022-03-29 VITALS — BP 123/58 | HR 72 | Ht 67.0 in | Wt 179.0 lb

## 2022-03-29 DIAGNOSIS — E1169 Type 2 diabetes mellitus with other specified complication: Secondary | ICD-10-CM

## 2022-03-29 DIAGNOSIS — M8580 Other specified disorders of bone density and structure, unspecified site: Secondary | ICD-10-CM | POA: Diagnosis not present

## 2022-03-29 DIAGNOSIS — L293 Anogenital pruritus, unspecified: Secondary | ICD-10-CM

## 2022-03-29 LAB — POCT GLYCOSYLATED HEMOGLOBIN (HGB A1C): Hemoglobin A1C: 7.9 % — AB (ref 4.0–5.6)

## 2022-03-29 MED ORDER — DAPAGLIFLOZIN PROPANEDIOL 5 MG PO TABS: 5.0000 mg | ORAL_TABLET | Freq: Every day | ORAL | 2 refills | Status: DC

## 2022-03-29 MED ORDER — NYSTATIN 100000 UNIT/GM EX CREA: 1.0000 | TOPICAL_CREAM | Freq: Two times a day (BID) | CUTANEOUS | 2 refills | Status: DC

## 2022-03-29 NOTE — Assessment & Plan Note (Signed)
1C is stable at 7.9 but still uncontrolled.  Will do a trial with Wilder Glade did warn about potential risk for increased UTIs and yeast infections and will need to monitor for increased in symptoms.  I did encourage her to stay on the metformin she was really wanting to wean off.  If she does really well in the [place] then we could always consider decreasing the metformin but we really need to get her A1c under 7.  Continue to work on [employer] and regular exercise.

## 2022-03-29 NOTE — Progress Notes (Signed)
Established Patient Office Visit  Subjective   Patient ID: Janet Mccullough, female    DOB: 1950-02-09  Age: 72 y.o. MRN: [id number]  Chief Complaint  Patient presents with   Diabetes    HPI Diabetes - no hypoglycemic events. No wounds or sores that are not healing well. No increased thirst or urination. Checking glucose at home.  She was unable to tolerate the Rybelsus.  It caused some nausea and decreased appetite.  She also woke up 1 night having midsternal chest pain that she felt was related to the medication so she stopped it.  She is still taking the metformin.  Occasionally gets some vaginal irritation and itching.  Sometimes she will just use Monistat over-the-counter and that does seem to help.  No specific rash.  Most of the time the itching is more external near the labia.    ROS    Objective:     BP (!) 123/58   Pulse 72   Ht 5\' 7"  (1.702 m)   Wt 179 lb (81.2 kg)   SpO2 96%   BMI 28.04 kg/m    Physical Exam Vitals and nursing note reviewed.  Constitutional:      Appearance: She is well-developed.  HENT:     Head: Normocephalic and atraumatic.  Cardiovascular:     Rate and Rhythm: Normal rate and regular rhythm.     Heart sounds: Normal heart sounds.  Pulmonary:     Effort: Pulmonary effort is normal.     Breath sounds: Normal breath sounds.  Skin:    General: Skin is warm and dry.  Neurological:     Mental Status: She is alert and oriented to person, place, and time.  Psychiatric:        Behavior: Behavior normal.     Results for orders placed or performed in visit on 03/29/22  POCT glycosylated hemoglobin (Hb A1C)  Result Value Ref Range   Hemoglobin A1C 7.9 (A) 4.0 - 5.6 %   HbA1c POC (<> result, manual entry)     HbA1c, POC (prediabetic range)     HbA1c, POC (controlled diabetic range)        The 10-year ASCVD risk score (Arnett DK, et al., 2019) is: 19%    Assessment & Plan:   Problem List Items Addressed This Visit        Endocrine   Type 2 diabetes mellitus with other specified complication ([place]) - Primary    1C is stable at 7.9 but still uncontrolled.  Will do a trial with Wilder Glade did warn about potential risk for increased UTIs and yeast infections and will need to monitor for increased in symptoms.  I did encourage her to stay on the metformin she was really wanting to wean off.  If she does really well in the [place] then we could always consider decreasing the metformin but we really need to get her A1c under 7.  Continue to work on [employer] and regular exercise.      Relevant Medications   dapagliflozin propanediol (FARXIGA) 5 MG TABS tablet   Other Relevant Orders   POCT glycosylated hemoglobin (Hb A1C) (Completed)     Musculoskeletal and Integument   Osteopenia    Due for repeat bone density.      Relevant Orders   DG Bone Density   Other Visit Diagnoses     Perineal itching, female       Relevant Medications   nystatin cream (MYCOSTATIN)  Itching-we will go ahead and treat with nystatin cream it is unclear if this is just some itching or if she is truly having an active yeast infection.  Can workup further if needed.  Return in about 3 months (around 06/29/2022) for Diabetes follow-up.    Beatrice Lecher, MD

## 2022-03-29 NOTE — Assessment & Plan Note (Signed)
Due for re-peat bone density 

## 2022-04-06 ENCOUNTER — Ambulatory Visit (INDEPENDENT_AMBULATORY_CARE_PROVIDER_SITE_OTHER): Payer: Medicare HMO

## 2022-04-06 DIAGNOSIS — M8580 Other specified disorders of bone density and structure, unspecified site: Secondary | ICD-10-CM | POA: Diagnosis not present

## 2022-04-06 DIAGNOSIS — M8589 Other specified disorders of bone density and structure, multiple sites: Secondary | ICD-10-CM | POA: Diagnosis not present

## 2022-04-06 NOTE — Progress Notes (Signed)
Hi Janet Mccullough, bone density shows a T-score of -1.9 this is consistent with mildly thin bones called osteopenia.  The great news is there was no statistically significant change from prior bone density in 2020.  That is fantastic!   The current recommendation for osteopenia (mildly thin bones) treatment includes:   #1 calcium-total of 1200 mg of calcium daily.  If you eat a very calcium rich diet you may be able to obtain that without a supplement.  If not, then I recommend calcium 500 mg twice a day.  There are several products over-the-counter such as Caltrate D and Viactiv chews which are great options that contain calcium and vitamin D. #2 vitamin D-recommend 800 international units daily. #3 exercise-recommend 30 minutes of weightbearing exercise 3 days a week.  Resistance training ,such as doing bands and light weights, can be particularly helpful.

## 2022-06-16 DIAGNOSIS — H524 Presbyopia: Secondary | ICD-10-CM | POA: Diagnosis not present

## 2022-06-16 DIAGNOSIS — E119 Type 2 diabetes mellitus without complications: Secondary | ICD-10-CM | POA: Diagnosis not present

## 2022-06-16 DIAGNOSIS — H5203 Hypermetropia, bilateral: Secondary | ICD-10-CM | POA: Diagnosis not present

## 2022-06-16 DIAGNOSIS — H2513 Age-related nuclear cataract, bilateral: Secondary | ICD-10-CM | POA: Diagnosis not present

## 2022-06-16 DIAGNOSIS — Z135 Encounter for screening for eye and ear disorders: Secondary | ICD-10-CM | POA: Diagnosis not present

## 2022-06-16 DIAGNOSIS — H04123 Dry eye syndrome of bilateral lacrimal glands: Secondary | ICD-10-CM | POA: Diagnosis not present

## 2022-06-16 LAB — HM DIABETES EYE EXAM

## 2022-06-23 ENCOUNTER — Other Ambulatory Visit: Payer: Self-pay | Admitting: Family Medicine

## 2022-06-23 DIAGNOSIS — E1169 Type 2 diabetes mellitus with other specified complication: Secondary | ICD-10-CM

## 2022-06-28 ENCOUNTER — Encounter: Payer: Self-pay | Admitting: Family Medicine

## 2022-07-03 ENCOUNTER — Other Ambulatory Visit: Payer: Self-pay | Admitting: Family Medicine

## 2022-07-03 DIAGNOSIS — E1169 Type 2 diabetes mellitus with other specified complication: Secondary | ICD-10-CM

## 2022-07-05 ENCOUNTER — Ambulatory Visit (INDEPENDENT_AMBULATORY_CARE_PROVIDER_SITE_OTHER): Payer: Medicare HMO | Admitting: Family Medicine

## 2022-07-05 DIAGNOSIS — Z0000 Encounter for general adult medical examination without abnormal findings: Secondary | ICD-10-CM

## 2022-07-05 NOTE — Patient Instructions (Addendum)
MEDICARE ANNUAL WELLNESS VISIT Health Maintenance Summary and Written Plan of Care  Janet Mccullough ,  Thank you for allowing me to perform your Medicare Annual Wellness Visit and for your ongoing commitment to your health.   Health Maintenance & Immunization History Health Maintenance  Topic Date Due   DTaP/Tdap/Td (2 - Tdap) 01/11/2012   Diabetic kidney evaluation - Urine ACR  07/13/2022   Zoster Vaccines- Shingrix (1 of 2) 10/05/2022 (Originally 03/04/1969)   Pneumonia Vaccine 61+ Years old (1 of 1 - PCV) 07/05/2023 (Originally 03/05/2015)   COVID-19 Vaccine (1) 05/07/2024 (Originally 03/05/1955)   INFLUENZA VACCINE  04/10/2027 (Originally 08/11/2022)   Fecal DNA (Cologuard)  09/05/2022   HEMOGLOBIN A1C  09/29/2022   Diabetic kidney evaluation - eGFR measurement  12/21/2022   FOOT EXAM  03/29/2023   OPHTHALMOLOGY EXAM  06/16/2023   Medicare Annual Wellness (AWV)  07/05/2023   MAMMOGRAM  11/26/2023   DEXA SCAN  04/05/2032   Hepatitis C Screening  Completed   HPV VACCINES  Aged Out   Immunization History  Administered Date(s) Administered   Td 01/10/2002    These are the patient goals that we discussed:  Goals Addressed               This Visit's Progress     Patient Stated (pt-stated)        07/05/2022 AWV Goal: Diabetes Management  Patient will maintain an A1C level below 8.0 Patient will not develop any diabetic foot complications Patient will not experience any hypoglycemic episodes over the next 3 months Patient will notify our office of any CBG readings outside of the provider recommended range by calling [phone] Patient will adhere to provider recommendations for diabetes management  Patient Self Management Activities take all medications as prescribed and report any negative side effects monitor and record blood sugar readings as directed adhere to a low carbohydrate diet that incorporates lean proteins, vegetables, whole grains, low glycemic fruits check  feet daily noting any sores, cracks, injuries, or callous formations see PCP or podiatrist if she notices any changes in her legs, feet, or toenails Patient will visit PCP and have an A1C level checked every 3 to 6 months as directed  have a yearly eye exam to monitor for vascular changes associated with diabetes and will request that the report be sent to her pcp.  consult with her PCP regarding any changes in her health or new or worsening symptoms          This is a list of Health Maintenance Items that are overdue or due now: Pneumococcal vaccine  Td vaccine Shingles vaccine Cologuard - due at the end of August. Urine ACR  Patient declined the vaccines.  Orders/Referrals Placed Today: No orders of the defined types were placed in this encounter.  (Contact our referral department at [phone] if you have not spoken with someone about your referral appointment within the next 5 days)    Follow-up Plan Follow-up with Agapito Games, MD as planned Urine ACR can be done after 07/13/22. Cologuard due in August.  Medicare wellness visit in one year.  Patient will access AVS on my chart.      Health Maintenance, Female Adopting a healthy lifestyle and getting preventive care are important in promoting health and wellness. Ask your health care provider about: The right schedule for you to have regular tests and exams. Things you can do on your own to prevent diseases and keep yourself healthy. What should I know  about diet, weight, and exercise? Eat a healthy diet  Eat a diet that includes plenty of vegetables, fruits, low-fat dairy products, and lean protein. Do not eat a lot of foods that are high in solid fats, added sugars, or sodium. Maintain a healthy weight Body mass index (BMI) is used to identify weight problems. It estimates body fat based on height and weight. Your health care provider can help determine your BMI and help you achieve or maintain a healthy  weight. Get regular exercise Get regular exercise. This is one of the most important things you can do for your health. Most adults should: Exercise for at least 150 minutes each week. The exercise should increase your heart rate and make you sweat (moderate-intensity exercise). Do strengthening exercises at least twice a week. This is in addition to the moderate-intensity exercise. Spend less time sitting. Even light physical activity can be beneficial. Watch cholesterol and blood lipids Have your blood tested for lipids and cholesterol at 72 years of age, then have this test every 5 years. Have your cholesterol levels checked more often if: Your lipid or cholesterol levels are high. You are older than 73 years of age. You are at high risk for heart disease. What should I know about cancer screening? Depending on your health history and family history, you may need to have cancer screening at various ages. This may include screening for: Breast cancer. Cervical cancer. Colorectal cancer. Skin cancer. Lung cancer. What should I know about heart disease, diabetes, and high blood pressure? Blood pressure and heart disease High blood pressure causes heart disease and increases the risk of stroke. This is more likely to develop in people who have high blood pressure readings or are overweight. Have your blood pressure checked: Every 3-5 years if you are 57-62 years of age. Every year if you are 15 years old or older. Diabetes Have regular diabetes screenings. This checks your fasting blood sugar level. Have the screening done: Once every three years after age 51 if you are at a normal weight and have a low risk for diabetes. More often and at a younger age if you are overweight or have a high risk for diabetes. What should I know about preventing infection? Hepatitis B If you have a higher risk for hepatitis B, you should be screened for this virus. Talk with your health care provider to  find out if you are at risk for hepatitis B infection. Hepatitis C Testing is recommended for: Everyone born from [birth year] through 1965. Anyone with known risk factors for hepatitis C. Sexually transmitted infections (STIs) Get screened for STIs, including gonorrhea and chlamydia, if: You are sexually active and are younger than 72 years of age. You are older than 72 years of age and your health care provider tells you that you are at risk for this type of infection. Your sexual activity has changed since you were last screened, and you are at increased risk for chlamydia or gonorrhea. Ask your health care provider if you are at risk. Ask your health care provider about whether you are at high risk for HIV. Your health care provider may recommend a prescription medicine to help prevent HIV infection. If you choose to take medicine to prevent HIV, you should first get tested for HIV. You should then be tested every 3 months for as long as you are taking the medicine. Pregnancy If you are about to stop having your period (premenopausal) and you may become pregnant, seek counseling  before you get pregnant. Take 400 to 800 micrograms (mcg) of folic acid every day if you become pregnant. Ask for birth control (contraception) if you want to prevent pregnancy. Osteoporosis and menopause Osteoporosis is a disease in which the bones lose minerals and strength with aging. This can result in bone fractures. If you are 33 years old or older, or if you are at risk for osteoporosis and fractures, ask your health care provider if you should: Be screened for bone loss. Take a calcium or vitamin D supplement to lower your risk of fractures. Be given hormone replacement therapy (HRT) to treat symptoms of menopause. Follow these instructions at home: Alcohol use Do not drink alcohol if: Your health care provider tells you not to drink. You are pregnant, may be pregnant, or are planning to become pregnant. If you  drink alcohol: Limit how much you have to: 0-1 drink a day. Know how much alcohol is in your drink. In the [place] one drink equals one 12 oz bottle of beer (355 mL), one 5 oz glass of wine (148 mL), or one 1 oz glass of hard liquor (44 mL). Lifestyle Do not use any products that contain nicotine or tobacco. These products include cigarettes, chewing tobacco, and vaping devices, such as e-cigarettes. If you need help quitting, ask your health care provider. Do not use street drugs. Do not share needles. Ask your health care provider for help if you need support or information about quitting drugs. General instructions Schedule regular health, dental, and eye exams. Stay current with your vaccines. Tell your health care provider if: You often feel depressed. You have ever been abused or do not feel safe at home. Summary Adopting a healthy lifestyle and getting preventive care are important in promoting health and wellness. Follow your health care provider's instructions about healthy diet, exercising, and getting tested or screened for diseases. Follow your health care provider's instructions on monitoring your cholesterol and blood pressure. This information is not intended to replace advice given to you by your health care provider. Make sure you discuss any questions you have with your health care provider. Document Revised: 05/18/2020 Document Reviewed: 05/18/2020 Elsevier Patient Education  2024 [employer].

## 2022-07-05 NOTE — Progress Notes (Signed)
MEDICARE ANNUAL WELLNESS VISIT  07/05/2022  Telephone Visit Disclaimer This Medicare AWV was conducted by telephone due to national recommendations for restrictions regarding the COVID-19 Pandemic (e.g. social distancing).  I verified, using two identifiers, that I am speaking with Janet Mccullough or their authorized healthcare agent. I discussed the limitations, risks, security, and privacy concerns of performing an evaluation and management service by telephone and the potential availability of an in-person appointment in the future. The patient expressed understanding and agreed to proceed.  Location of Patient: Home Location of Provider (nurse):  Provider home  Subjective:    Janet Mccullough is a 72 y.o. female patient of Metheney, Barbarann Ehlers, MD who had a Medicare Annual Wellness Visit today via telephone. Janet Mccullough is Retired and lives alone. she has 2 children. she reports that she is socially active and does interact with friends/family regularly. she is moderately physically active and enjoys doing crafts, read and learning how to quilt.  Patient Care Team: Agapito Games, MD as PCP - General (Family Medicine) Gabriel Carina, [facility] as Pharmacist (Pharmacist)     07/05/2022   10:56 AM 07/01/2021    3:56 PM 05/20/2020   11:29 AM 10/03/2016   10:17 AM  Advanced Directives  Does Patient Have a Medical Advance Directive? Yes Yes Yes Yes  Type of Advance Directive Living will Living will Healthcare Power of [place];Living will Healthcare Power of [place];Living will  Does patient want to make changes to medical advance directive? No - Patient declined No - Patient declined No - Patient declined   Copy of Healthcare Power of Attorney in Chart?   No - copy requested No - copy requested  Would patient like information on creating a medical advance directive?   No - Patient declined     Hospital Utilization Over the Past 12 Months: # of hospitalizations or ER visits: 0 # of  surgeries: 0  Review of Systems    Patient reports that her overall health is unchanged compared to last year.  History obtained from chart review and the patient  Patient Reported Readings (BP, Pulse, CBG, Weight, etc) none  Pain Assessment Pain : No/denies pain     Current Medications & Allergies (verified) Allergies as of 07/05/2022       Reactions   Moxifloxacin Hcl In Nacl Hives   Penicillins Hives   REACTION: unknown rx   Quinolones Hives   In particular Avelox   Semaglutide Nausea Only, Other (See Comments)   Nausea and constipation on 7mg  dosing, nausea/indigestion episode when restarting 3mg  daily        Medication List        Accurate as of July 05, 2022 11:14 AM. If you have any questions, ask your nurse or doctor.          Adv Turmeric Curcumin Complex Caps Take 1 capsule by mouth daily.   AMBULATORY NON FORMULARY MEDICATION Take 1 capsule by mouth in the morning and at bedtime. Medication Name: BONE BUILDING SUPPLEMENT   B-COMPLEX/B-12 SL Place 1 tablet under the tongue daily.   Farxiga 5 MG Tabs tablet Generic drug: dapagliflozin propanediol TAKE 1 TABLET BY MOUTH ONCE DAILY BEFORE BREAKFAST   glucose blood test strip Commonly known as: ONE TOUCH ULTRA TEST Dx: E11.9. Check fasting blood sugar daily.   MAGNESIUM PO Take 1,350 mg by mouth daily. Mg Maleate (3 capsule daily)   metFORMIN 500 MG 24 hr tablet Commonly known as: GLUCOPHAGE-XR TAKE 2 TABLETS BY MOUTH ONCE  DAILY WITH BREAKFAST   nystatin cream Commonly known as: MYCOSTATIN Apply 1 Application topically 2 (two) times daily. X 1 week   ONE TOUCH ULTRA SYSTEM KIT w/Device Kit 1 kit by Does not apply route once. Lancets, test strips for testing 3 times a week. Dx: IFG Dx code: R73.01   OneTouch Delica Plus Lancet33G Misc USE 1  TO CHECK GLUCOSE IN THE MORNING   Vitamin C Liqd Take 1,000 mg by mouth daily.   Vitamin D3 125 MCG (5000 UT) Caps Take 1 capsule by mouth daily.  (7500 units) per integrative medicine        History (reviewed): Past Medical History:  Diagnosis Date   Allergy as child   Pennicillin, Avalox (few years ago)   Arthritis    Blood transfusion without reported diagnosis [phone]   During surgery   Cancer (HCC)    breast   GERD (gastroesophageal reflux disease) Late 80's   no current problems   Impaired fasting glucose    Microscopic hematuria    renal ultrasound normal (Dr Retta Diones)   Normal echocardiogram    EF 60-65%   Past Surgical History:  Procedure Laterality Date   ABDOMINAL HYSTERECTOMY  07/2001   for DUB/ cysts   BREAST SURGERY  1987   Mastectomy - left   MASTECTOMY Left    TUBAL LIGATION  1979   uterine cyst removed  05/1997   Family History  Problem Relation Age of Onset   Diabetes Mother    Diabetes Father    Heart disease Father    Stroke Father    Diabetes Brother    Diabetes Brother        type 2   ADD / ADHD Son    Social History   Socioeconomic History   Marital status: Widowed    Spouse name: Not on file   Number of children: 2   Years of education: 2   Highest education level: Some college, no degree  Occupational History    Comment: Retired  Tobacco Use   Smoking status: Never   Smokeless tobacco: Never  Substance and Sexual Activity   Alcohol use: Not Currently    Comment: Occasionally   Drug use: Never   Sexual activity: Not Currently    Birth control/protection: None  Other Topics Concern   Not on file  Social History Narrative   Lives alone. She has two children of her own and two step children. She enjoys doing crafts, read and learning how to quilt.   Social Determinants of Health   Financial Resource Strain: Low Risk  (07/05/2022)   Overall Financial Resource Strain (CARDIA)    Difficulty of Paying Living Expenses: Not hard at all  Food Insecurity: No Food Insecurity (07/05/2022)   Hunger Vital Sign    Worried About Running Out of Food in the Last Year: Never  true    Ran Out of Food in the Last Year: Never true  Transportation Needs: No Transportation Needs (07/05/2022)   PRAPARE - Administrator, Civil Service (Medical): No    Lack of Transportation (Non-Medical): No  Physical Activity: Insufficiently Active (07/05/2022)   Exercise Vital Sign    Days of Exercise per Week: 7 days    Minutes of Exercise per Session: 20 min  Stress: No Stress Concern Present (07/05/2022)   [employer] of Occupational Health - Occupational Stress Questionnaire    Feeling of Stress : Not at all  Social Connections: Moderately Isolated (07/05/2022)  Social Connection and Isolation Panel [NHANES]    Frequency of Communication with Friends and Family: More than three times a week    Frequency of Social Gatherings with Friends and Family: Three times a week    Attends Religious Services: Never    Active Member of Clubs or Organizations: Yes    Attends Banker Meetings: More than 4 times per year    Marital Status: Widowed    Activities of Daily Living    07/05/2022   11:01 AM  In your present state of health, do you have any difficulty performing the following activities:  Hearing? 0  Vision? 0  Difficulty concentrating or making decisions? 0  Walking or climbing stairs? 0  Dressing or bathing? 0  Doing errands, shopping? 0  Preparing Food and eating ? N  Using the Toilet? N  In the past six months, have you accidently leaked urine? Y  Comment stress incontinence  Do you have problems with loss of bowel control? N  Managing your Medications? N  Managing your Finances? N  Housekeeping or managing your Housekeeping? N    Patient Education/ Literacy How often do you need to have someone help you when you read instructions, pamphlets, or other written materials from your doctor or pharmacy?: 1 - Never What is the last grade level you completed in school?: 12th grade and some college  Exercise    Diet Patient reports  consuming 2 meals a day and 1 snack(s) a day Patient reports that her primary diet is: Regular Patient reports that she does have regular access to food.   Depression Screen    07/05/2022   10:58 AM 07/01/2021    3:57 PM 03/10/2021    9:41 AM 08/24/2020    9:26 AM 05/20/2020   11:24 AM 05/19/2020    8:53 AM 08/07/2019    9:17 AM  PHQ 2/9 Scores  PHQ - 2 Score 0 0 0 0 0 0 0  PHQ- 9 Score       3     Fall Risk    07/05/2022   10:58 AM 03/29/2022    8:46 AM 06/27/2021    3:29 PM 06/14/2021    8:39 AM 03/10/2021    9:41 AM  Fall Risk   Falls in the past year? 0 0 0 0 0  Number falls in past yr: 0 0  0 0  Injury with Fall? 0 0  0 0  Risk for fall due to : No Fall Risks No Fall Risks  No Fall Risks No Fall Risks  Follow up Falls evaluation completed Falls evaluation completed  Falls prevention discussed;Falls evaluation completed Falls prevention discussed;Falls evaluation completed     Objective:  Janet Mccullough seemed alert and oriented and she participated appropriately during our telephone visit.  Blood Pressure Weight BMI  BP Readings from Last 3 Encounters:  03/29/22 (!) 123/58  12/20/21 (!) 125/58  09/20/21 130/65   Wt Readings from Last 3 Encounters:  03/29/22 179 lb (81.2 kg)  12/20/21 180 lb (81.6 kg)  09/20/21 181 lb (82.1 kg)   BMI Readings from Last 1 Encounters:  03/29/22 28.04 kg/m    *Unable to obtain current vital signs, weight, and BMI due to telephone visit type  Hearing/Vision  [place] did not seem to have difficulty with hearing/understanding during the telephone conversation Reports that she has had a formal eye exam by an eye care professional within the past year Reports that she has not had  a formal hearing evaluation within the past year *Unable to fully assess hearing and vision during telephone visit type  Cognitive Function:    07/05/2022   11:03 AM 07/01/2021    4:02 PM 05/20/2020   11:33 AM  6CIT Screen  What Year? 0 points 0 points 0 points   What month? 0 points 0 points 0 points  What time? 0 points 0 points 0 points  Count back from 20 0 points 0 points 0 points  Months in reverse 0 points 0 points 0 points  Repeat phrase 0 points 0 points 0 points  Total Score 0 points 0 points 0 points   (Normal:0-7, Significant for Dysfunction: >8)  Normal Cognitive Function Screening: Yes   Immunization & Health Maintenance Record Immunization History  Administered Date(s) Administered   Td 01/10/2002    Health Maintenance  Topic Date Due   DTaP/Tdap/Td (2 - Tdap) 01/11/2012   Diabetic kidney evaluation - Urine ACR  07/13/2022   Zoster Vaccines- Shingrix (1 of 2) 10/05/2022 (Originally 03/04/1969)   Pneumonia Vaccine 74+ Years old (1 of 1 - PCV) 07/05/2023 (Originally 03/05/2015)   COVID-19 Vaccine (1) 05/07/2024 (Originally 03/05/1955)   INFLUENZA VACCINE  04/10/2027 (Originally 08/11/2022)   Fecal DNA (Cologuard)  09/05/2022   HEMOGLOBIN A1C  09/29/2022   Diabetic kidney evaluation - eGFR measurement  12/21/2022   FOOT EXAM  03/29/2023   OPHTHALMOLOGY EXAM  06/16/2023   Medicare Annual Wellness (AWV)  07/05/2023   MAMMOGRAM  11/26/2023   DEXA SCAN  04/05/2032   Hepatitis C Screening  Completed   HPV VACCINES  Aged Out       Assessment  This is a routine wellness examination for [employer].  Health Maintenance: Due or Overdue Health Maintenance Due  Topic Date Due   DTaP/Tdap/Td (2 - Tdap) 01/11/2012   Diabetic kidney evaluation - Urine ACR  07/13/2022    Janet Mccullough does not need a referral for Community Assistance: Care Management:   no Social Work:    no Prescription Assistance:  no Nutrition/Diabetes Education:  no   Plan:  Personalized Goals  Goals Addressed               This Visit's Progress     Patient Stated (pt-stated)        07/05/2022 AWV Goal: Diabetes Management  Patient will maintain an A1C level below 8.0 Patient will not develop any diabetic foot complications Patient will  not experience any hypoglycemic episodes over the next 3 months Patient will notify our office of any CBG readings outside of the provider recommended range by calling [phone] Patient will adhere to provider recommendations for diabetes management  Patient Self Management Activities take all medications as prescribed and report any negative side effects monitor and record blood sugar readings as directed adhere to a low carbohydrate diet that incorporates lean proteins, vegetables, whole grains, low glycemic fruits check feet daily noting any sores, cracks, injuries, or callous formations see PCP or podiatrist if she notices any changes in her legs, feet, or toenails Patient will visit PCP and have an A1C level checked every 3 to 6 months as directed  have a yearly eye exam to monitor for vascular changes associated with diabetes and will request that the report be sent to her pcp.  consult with her PCP regarding any changes in her health or new or worsening symptoms        Personalized Health Maintenance & Screening Recommendations  Pneumococcal vaccine  Td  vaccine Shingles vaccine Cologuard - due at the end of August. Urine ACR  Patient declined the vaccines.  Lung Cancer Screening Recommended: no (Low Dose CT Chest recommended if Age 57-80 years, 20 pack-year currently smoking OR have quit w/in past 15 years) Hepatitis C Screening recommended: no HIV Screening recommended: no  Advanced Directives: Written information was not prepared per patient's request.  Referrals & Orders No orders of the defined types were placed in this encounter.   Follow-up Plan Follow-up with Agapito Games, MD as planned Urine ACR can be done after 07/13/22. Cologuard due in August.  Medicare wellness visit in one year.  Patient will access AVS on my chart.    I have personally reviewed and noted the following in the patient's chart:   Medical and social history Use of alcohol,  tobacco or illicit drugs  Current medications and supplements Functional ability and status Nutritional status Physical activity Advanced directives List of other physicians Hospitalizations, surgeries, and ER visits in previous 12 months Vitals Screenings to include cognitive, depression, and falls Referrals and appointments  In addition, I have reviewed and discussed with Janet Mccullough certain preventive protocols, quality metrics, and best practice recommendations. A written personalized care plan for preventive services as well as general preventive health recommendations is available and can be mailed to the patient at her request.      Modesto Charon, RN BSN  07/05/2022

## 2022-07-06 ENCOUNTER — Ambulatory Visit (INDEPENDENT_AMBULATORY_CARE_PROVIDER_SITE_OTHER): Payer: Medicare HMO | Admitting: Family Medicine

## 2022-07-06 ENCOUNTER — Encounter: Payer: Self-pay | Admitting: Family Medicine

## 2022-07-06 VITALS — BP 128/73 | HR 78 | Ht 67.0 in | Wt 176.0 lb

## 2022-07-06 DIAGNOSIS — R809 Proteinuria, unspecified: Secondary | ICD-10-CM

## 2022-07-06 DIAGNOSIS — E1169 Type 2 diabetes mellitus with other specified complication: Secondary | ICD-10-CM

## 2022-07-06 DIAGNOSIS — R590 Localized enlarged lymph nodes: Secondary | ICD-10-CM

## 2022-07-06 DIAGNOSIS — E1129 Type 2 diabetes mellitus with other diabetic kidney complication: Secondary | ICD-10-CM

## 2022-07-06 LAB — POCT GLYCOSYLATED HEMOGLOBIN (HGB A1C): Hemoglobin A1C: 7.2 % — AB (ref 4.0–5.6)

## 2022-07-06 LAB — POCT UA - MICROALBUMIN
Creatinine, POC: 50 mg/dL
Microalbumin Ur, POC: 10 mg/L

## 2022-07-06 MED ORDER — DAPAGLIFLOZIN PRO-METFORMIN ER 5-1000 MG PO TB24: 1.0000 | ORAL_TABLET | Freq: Every day | ORAL | 0 refills | Status: DC

## 2022-07-06 NOTE — Assessment & Plan Note (Signed)
She is positive for microalbuminuria today.  We discussed the benefits of ACE/ARB to help reduce progression of kidney issues related to diabetes.  Encouraged her to consider it.

## 2022-07-06 NOTE — Progress Notes (Signed)
Established Patient Office Visit  Subjective   Patient ID: Janet Mccullough, female    DOB: [birth year]  Age: 72 y.o. MRN: [id number]  Chief Complaint  Patient presents with   Diabetes    HPI Having neck pain radiating down to her traps from her base of skull. Worse on the right.  She says she has had some neck pain on and off for years.  She had an infection in her neck when she was a child and had multiple injuries growing up even in her 30s she was told she had significant OA in her neck.  Diabetes - no hypoglycemic events. No wounds or sores that are not healing well. No increased thirst or urination. Checking glucose at home. Taking medications as prescribed without any side effects.    ROS    Objective:     BP 128/73   Pulse 78   Ht 5\' 7"  (1.702 m)   Wt 176 lb (79.8 kg)   SpO2 100%   BMI 27.57 kg/m    Physical Exam Constitutional:      Appearance: She is well-developed.  HENT:     Head: Normocephalic and atraumatic.     Right Ear: External ear normal.     Left Ear: Tympanic membrane, ear canal and external ear normal.     Nose: Nose normal.  Eyes:     Conjunctiva/sclera: Conjunctivae normal.     Pupils: Pupils are equal, round, and reactive to light.  Neck:     Thyroid: No thyromegaly.  Cardiovascular:     Rate and Rhythm: Normal rate and regular rhythm.     Heart sounds: Normal heart sounds.  Pulmonary:     Effort: Pulmonary effort is normal.     Breath sounds: Normal breath sounds. No wheezing.  Musculoskeletal:     Cervical back: Neck supple.     Comments: Tightness in both trapezius muscles.  Lymphadenopathy:     Cervical: No cervical adenopathy.  Skin:    General: Skin is warm and dry.     Comments: Posterior left side of neck there is approximately 1 cm palpable lymph node.  Neurological:     Mental Status: She is alert and oriented to person, place, and time.      Results for orders placed or performed in visit on 07/06/22  POCT  glycosylated hemoglobin (Hb A1C)  Result Value Ref Range   Hemoglobin A1C 7.2 (A) 4.0 - 5.6 %   HbA1c POC (<> result, manual entry)     HbA1c, POC (prediabetic range)     HbA1c, POC (controlled diabetic range)    POCT UA - Microalbumin  Result Value Ref Range   Microalbumin Ur, POC 10 mg/L   Creatinine, POC 50 mg/dL   Albumin/Creatinine Ratio, Urine, POC 30-300       The 10-year ASCVD risk score (Arnett DK, et al., 2019) is: 20.3%    Assessment & Plan:   Problem List Items Addressed This Visit       Endocrine   Type 2 diabetes mellitus with other specified complication (HCC) - Primary    Doing well on current regimen so she is can I feel the combo pill together to simplify her medication regimen.  Follow-up in 3 months.  Continue to work on healthy diet and regular exercise.      Relevant Medications   Dapagliflozin Pro-metFORMIN ER 05-998 MG TB24   Other Relevant Orders   POCT glycosylated hemoglobin (Hb A1C) (Completed)   POCT UA -  Microalbumin (Completed)   CBC with Differential/Platelet   COMPLETE METABOLIC PANEL WITH GFR   Microalbuminuria due to type 2 diabetes mellitus (HCC)    She is positive for microalbuminuria today.  We discussed the benefits of ACE/ARB to help reduce progression of kidney issues related to diabetes.  Encouraged her to consider it.      Relevant Medications   Dapagliflozin Pro-metFORMIN ER 05-998 MG TB24   Other Visit Diagnoses     Cervical lymphadenopathy       Relevant Orders   US Soft Tissue Head/Neck (NON-THYROID)   CBC with Differential/Platelet   COMPLETE METABOLIC PANEL WITH GFR      Posterior cervical lymphadenopathy-will schedule for ultrasound for further evaluation she noticed the lump a couple of weeks ago.  She is not sure if it is changed but she has had a lot of tightness and soreness in the left side of her neck and even around that left ear.  Return in about 3 months (around 10/13/2022) for Diabetes follow-up.     Nani Gasser, MD

## 2022-07-06 NOTE — Assessment & Plan Note (Addendum)
Doing well on current regimen so she is can I feel the combo pill together to simplify her medication regimen.  Follow-up in 3 months.  Continue to work on healthy diet and regular exercise.

## 2022-07-07 ENCOUNTER — Ambulatory Visit (INDEPENDENT_AMBULATORY_CARE_PROVIDER_SITE_OTHER): Payer: Medicare HMO

## 2022-07-07 DIAGNOSIS — R590 Localized enlarged lymph nodes: Secondary | ICD-10-CM

## 2022-07-07 LAB — CBC WITH DIFFERENTIAL/PLATELET
Absolute Monocytes: 336 cells/uL (ref 200–950)
Basophils Absolute: 50 cells/uL (ref 0–200)
Basophils Relative: 0.9 %
Eosinophils Absolute: 101 cells/uL (ref 15–500)
Eosinophils Relative: 1.8 %
HCT: 45.2 % — ABNORMAL HIGH (ref 35.0–45.0)
Hemoglobin: 15.1 g/dL (ref 11.7–15.5)
Lymphs Abs: 2246 cells/uL (ref 850–3900)
MCH: 31.1 pg (ref 27.0–33.0)
MCHC: 33.4 g/dL (ref 32.0–36.0)
MCV: 93 fL (ref 80.0–100.0)
MPV: 11.1 fL (ref 7.5–12.5)
Monocytes Relative: 6 %
Neutro Abs: 2867 cells/uL (ref 1500–7800)
Neutrophils Relative %: 51.2 %
Platelets: 257 10*3/uL (ref 140–400)
RBC: 4.86 10*6/uL (ref 3.80–5.10)
RDW: 12.8 % (ref 11.0–15.0)
Total Lymphocyte: 40.1 %
WBC: 5.6 10*3/uL (ref 3.8–10.8)

## 2022-07-07 LAB — COMPLETE METABOLIC PANEL WITH GFR
AG Ratio: 1.9 (calc) (ref 1.0–2.5)
ALT: 18 U/L (ref 6–29)
AST: 11 U/L (ref 10–35)
Albumin: 4.6 g/dL (ref 3.6–5.1)
Alkaline phosphatase (APISO): 54 U/L (ref 37–153)
BUN: 15 mg/dL (ref 7–25)
CO2: 26 mmol/L (ref 20–32)
Calcium: 9.6 mg/dL (ref 8.6–10.4)
Chloride: 104 mmol/L (ref 98–110)
Creat: 0.65 mg/dL (ref 0.60–1.00)
Globulin: 2.4 g/dL (calc) (ref 1.9–3.7)
Glucose, Bld: 162 mg/dL — ABNORMAL HIGH (ref 65–99)
Potassium: 4.7 mmol/L (ref 3.5–5.3)
Sodium: 140 mmol/L (ref 135–146)
Total Bilirubin: 0.5 mg/dL (ref 0.2–1.2)
Total Protein: 7 g/dL (ref 6.1–8.1)
eGFR: 93 mL/min/{1.73_m2} (ref >=60)

## 2022-07-07 NOTE — Progress Notes (Signed)
Your lab work is within acceptable range and there are no concerning findings.   ?

## 2022-07-11 NOTE — Progress Notes (Signed)
HI Janet Mccullough. Looks like LN is about 1.2  x 1.1 cm .I would recommend repeat US in 6 weeks to see if getting larger or smaller.

## 2022-07-12 ENCOUNTER — Encounter: Payer: Self-pay | Admitting: Family Medicine

## 2022-07-12 DIAGNOSIS — R590 Localized enlarged lymph nodes: Secondary | ICD-10-CM

## 2022-07-12 NOTE — Telephone Encounter (Signed)
Orders Placed This Encounter  Procedures   US Soft Tissue Head/Neck (NON-THYROID)    Standing Status:   Future    Standing Expiration Date:   07/12/2023    Scheduling Instructions:     F/u enlarged LN    Order Specific Question:   Reason for Exam (SYMPTOM  OR DIAGNOSIS REQUIRED)    Answer:   swollen LN on left posterior lateral neck    Order Specific Question:   Preferred imaging location?    Answer:   [facility]

## 2022-07-18 DIAGNOSIS — L570 Actinic keratosis: Secondary | ICD-10-CM | POA: Diagnosis not present

## 2022-07-18 DIAGNOSIS — D225 Melanocytic nevi of trunk: Secondary | ICD-10-CM | POA: Diagnosis not present

## 2022-07-18 DIAGNOSIS — L821 Other seborrheic keratosis: Secondary | ICD-10-CM | POA: Diagnosis not present

## 2022-07-18 DIAGNOSIS — D2261 Melanocytic nevi of right upper limb, including shoulder: Secondary | ICD-10-CM | POA: Diagnosis not present

## 2022-07-18 DIAGNOSIS — D2262 Melanocytic nevi of left upper limb, including shoulder: Secondary | ICD-10-CM | POA: Diagnosis not present

## 2022-07-21 ENCOUNTER — Other Ambulatory Visit: Payer: Self-pay | Admitting: *Deleted

## 2022-07-21 DIAGNOSIS — E1169 Type 2 diabetes mellitus with other specified complication: Secondary | ICD-10-CM

## 2022-08-18 ENCOUNTER — Other Ambulatory Visit: Payer: Medicare HMO

## 2022-08-19 ENCOUNTER — Ambulatory Visit: Payer: Medicare HMO

## 2022-08-19 DIAGNOSIS — R590 Localized enlarged lymph nodes: Secondary | ICD-10-CM | POA: Diagnosis not present

## 2022-08-22 ENCOUNTER — Other Ambulatory Visit: Payer: Self-pay | Admitting: *Deleted

## 2022-08-22 ENCOUNTER — Encounter: Payer: Self-pay | Admitting: Family Medicine

## 2022-08-22 DIAGNOSIS — R591 Generalized enlarged lymph nodes: Secondary | ICD-10-CM

## 2022-08-22 NOTE — Progress Notes (Signed)
Hi Janet Mccullough, they did check the area again and it has not changed in size or appearance.  It is not for sure that it is a lymph node.  It is a oval-shaped structure which could also be a cyst.  But again it has not changed from June so I do not think we need to repeat any imaging at this point unless you feel like it is getting larger.  If you start to notice any pain or tenderness we can always have a general surgeon take a look at it to see if they would recommend removal.

## 2022-09-16 ENCOUNTER — Telehealth: Payer: Self-pay | Admitting: Family Medicine

## 2022-09-16 NOTE — Telephone Encounter (Signed)
error 

## 2022-10-06 ENCOUNTER — Encounter: Payer: Self-pay | Admitting: Family Medicine

## 2022-10-06 ENCOUNTER — Ambulatory Visit (INDEPENDENT_AMBULATORY_CARE_PROVIDER_SITE_OTHER): Payer: Medicare HMO | Admitting: Family Medicine

## 2022-10-06 VITALS — BP 111/57 | HR 72 | Ht 67.0 in | Wt 176.0 lb

## 2022-10-06 DIAGNOSIS — R809 Proteinuria, unspecified: Secondary | ICD-10-CM

## 2022-10-06 DIAGNOSIS — E1169 Type 2 diabetes mellitus with other specified complication: Secondary | ICD-10-CM

## 2022-10-06 DIAGNOSIS — Z1211 Encounter for screening for malignant neoplasm of colon: Secondary | ICD-10-CM | POA: Diagnosis not present

## 2022-10-06 DIAGNOSIS — E1129 Type 2 diabetes mellitus with other diabetic kidney complication: Secondary | ICD-10-CM

## 2022-10-06 LAB — POCT GLYCOSYLATED HEMOGLOBIN (HGB A1C): Hemoglobin A1C: 7.2 % — AB (ref 4.0–5.6)

## 2022-10-06 MED ORDER — DAPAGLIFLOZIN PRO-METFORMIN ER 10-500 MG PO TB24: 1.0000 | ORAL_TABLET | Freq: Every morning | ORAL | 3 refills | Status: DC

## 2022-10-06 NOTE — Progress Notes (Signed)
Established Patient Office Visit  Subjective   Patient ID: Janet Mccullough, female    DOB: 09-16-1950  Age: 72 y.o. MRN: [id number]  Chief Complaint  Patient presents with   Diabetes    HPI  Diabetes - no hypoglycemic events. No wounds or sores that are not healing well. No increased thirst or urination. Checking glucose at home. Taking medications as prescribed without any side effects.  He does have an ENT appointment coming up for the tightness and fullness sensation that she is getting in her upper neck and under her jaw.  She also wanted to let me know that she received a notification about getting her Cologuard updated it has been 3 years this month.  And she tried to click on the responses but never heard back anything.  She is okay with going ahead and moving forward with Cologuard.      ROS    Objective:     BP (!) 111/57   Pulse 72   Ht 5\' 7"  (1.702 m)   Wt 176 lb (79.8 kg)   SpO2 100%   BMI 27.57 kg/m    Physical Exam Vitals and nursing note reviewed.  Constitutional:      Appearance: Normal appearance.  HENT:     Head: Normocephalic and atraumatic.  Eyes:     Conjunctiva/sclera: Conjunctivae normal.  Cardiovascular:     Rate and Rhythm: Normal rate and regular rhythm.  Pulmonary:     Effort: Pulmonary effort is normal.     Breath sounds: Normal breath sounds.  Skin:    General: Skin is warm and dry.  Neurological:     Mental Status: She is alert.  Psychiatric:        Mood and Affect: Mood normal.      Results for orders placed or performed in visit on 10/06/22  POCT HgB A1C  Result Value Ref Range   Hemoglobin A1C 7.2 (A) 4.0 - 5.6 %   HbA1c POC (<> result, manual entry)     HbA1c, POC (prediabetic range)     HbA1c, POC (controlled diabetic range)        The 10-year ASCVD risk score (Arnett DK, et al., 2019) is: 15.8%    Assessment & Plan:   Problem List Items Addressed This Visit       Endocrine   Type 2 diabetes mellitus  with other specified complication (HCC) - Primary    A1c today which is stable.  We had previously combined her [place] and metformin together.  She has been tolerating that well without any problems so we will increase the [place] component to 10 mg.  Otherwise follow-up in 3 to 4 months.      Relevant Medications   Dapagliflozin Pro-metFORMIN ER 10-500 MG TB24   Other Relevant Orders   POCT HgB A1C (Completed)   Microalbuminuria due to type 2 diabetes mellitus (HCC)    We had previously discussed benefits of ACE/ARB.  She still is thinking about it.      Relevant Medications   Dapagliflozin Pro-metFORMIN ER 10-500 MG TB24   Other Visit Diagnoses     Screening for colon cancer       Relevant Orders   Cologuard      Declines vaccines today.  Cologuard ordered.  Return in about 4 months (around 02/05/2023) for Diabetes follow-up.    Nani Gasser, MD

## 2022-10-06 NOTE — Assessment & Plan Note (Signed)
We had previously discussed benefits of ACE/ARB.  She still is thinking about it.

## 2022-10-06 NOTE — Assessment & Plan Note (Signed)
A1c today which is stable.  We had previously combined her [place] and metformin together.  She has been tolerating that well without any problems so we will increase the [place] component to 10 mg.  Otherwise follow-up in 3 to 4 months.

## 2022-10-07 DIAGNOSIS — R221 Localized swelling, mass and lump, neck: Secondary | ICD-10-CM | POA: Diagnosis not present

## 2022-10-07 DIAGNOSIS — H903 Sensorineural hearing loss, bilateral: Secondary | ICD-10-CM | POA: Diagnosis not present

## 2022-10-07 DIAGNOSIS — M26629 Arthralgia of temporomandibular joint, unspecified side: Secondary | ICD-10-CM | POA: Diagnosis not present

## 2022-10-11 ENCOUNTER — Telehealth: Payer: Self-pay | Admitting: Family Medicine

## 2022-10-11 NOTE — Telephone Encounter (Signed)
Patietn called because she has a CT Scan scheduled and was asked to stop taking her metformin/forxiga 48 hrs before. She is curious to know if she needs to reschulde her scan.Please advise

## 2022-10-12 ENCOUNTER — Other Ambulatory Visit: Payer: Self-pay | Admitting: Family Medicine

## 2022-10-12 DIAGNOSIS — Z1211 Encounter for screening for malignant neoplasm of colon: Secondary | ICD-10-CM | POA: Diagnosis not present

## 2022-10-12 DIAGNOSIS — Z1231 Encounter for screening mammogram for malignant neoplasm of breast: Secondary | ICD-10-CM

## 2022-10-12 NOTE — Telephone Encounter (Signed)
Why would she need to reschedule. I don't understand.  OK to hold med if that is her concern.

## 2022-10-14 DIAGNOSIS — R221 Localized swelling, mass and lump, neck: Secondary | ICD-10-CM | POA: Diagnosis not present

## 2022-10-19 LAB — COLOGUARD: COLOGUARD: NEGATIVE

## 2022-10-19 NOTE — Progress Notes (Signed)
Great news! Your Cologuard test is negative.  Recommend repeat colon cancer screening in 3 years.

## 2022-10-19 NOTE — Telephone Encounter (Signed)
Patient informed and has had the CT scan done - she did hold the medication 48 hours before.

## 2022-10-31 DIAGNOSIS — S8391XA Sprain of unspecified site of right knee, initial encounter: Secondary | ICD-10-CM | POA: Diagnosis not present

## 2022-11-03 ENCOUNTER — Telehealth: Payer: Self-pay | Admitting: Family Medicine

## 2022-11-03 NOTE — Telephone Encounter (Signed)
She still having pain then I would highly recommend that we get her in with Dr. Karie Schwalbe or sports med or orthopedics.  I tried to look for note from urgent care but did not see 1.

## 2022-11-03 NOTE — Telephone Encounter (Signed)
Patient called she was seen in Urgent Care for her ankle she is asking if she needs to continue what they told her to do or does she need to make a follow up appointment Please advise

## 2022-11-07 ENCOUNTER — Ambulatory Visit (INDEPENDENT_AMBULATORY_CARE_PROVIDER_SITE_OTHER): Payer: Medicare HMO | Admitting: Sports Medicine

## 2022-11-07 ENCOUNTER — Encounter: Payer: Self-pay | Admitting: Sports Medicine

## 2022-11-07 DIAGNOSIS — M25561 Pain in right knee: Secondary | ICD-10-CM | POA: Insufficient documentation

## 2022-11-07 DIAGNOSIS — M1711 Unilateral primary osteoarthritis, right knee: Secondary | ICD-10-CM | POA: Diagnosis not present

## 2022-11-07 MED ORDER — MELOXICAM 15 MG PO TABS
ORAL_TABLET | ORAL | 3 refills | Status: DC
Start: 2022-11-07 — End: 2023-07-06

## 2022-11-07 NOTE — Progress Notes (Signed)
    Procedures performed today:    None.  Independent interpretation of notes and tests performed by another provider:   None.  Brief History, Exam, Impression, and Recommendations:    Acute pain of right knee This is a very pleasant 72 year old female, right a week ago she had a fall, she was seen in urgent care, she had some x-rays that showed per her report osteoarthritis but no other acute findings. I was unable to see the x-rays myself. She was given some ibuprofen and discharge. Today she is here with persistence of the knee pain, mostly medial and posterior, she also swelling down the leg, bruising over her mid shin as well as swelling at the ankle. Ankle exam is unremarkable, I think were dealing mostly with dependent edema and she does have a negative Homans' sign. From her knee standpoint she has full motion and full strength however pain with terminal flexion with a positive McMurray's sign with pain but no catch. Mild effusion. She is heading the right direction so we will continue conservative treatment in the form of meloxicam, a reaction knee brace and home physical therapy, she will do partial weightbearing with a cane and I can see her back in about 3 weeks, at that point we will consider an injection +/- MRI if not better.    ____________________________________________ Ihor Austin. Benjamin Stain, M.D., ABFM., CAQSM., AME. Primary Care and Sports Medicine [facility]  Adjunct Professor of Family Medicine  [place] [employer] [facility] [employer]  Restaurant manager, fast food

## 2022-11-07 NOTE — Assessment & Plan Note (Signed)
This is a very pleasant 72 year old female, right a week ago she had a fall, she was seen in urgent care, she had some x-rays that showed per her report osteoarthritis but no other acute findings. I was unable to see the x-rays myself. She was given some ibuprofen and discharge. Today she is here with persistence of the knee pain, mostly medial and posterior, she also swelling down the leg, bruising over her mid shin as well as swelling at the ankle. Ankle exam is unremarkable, I think were dealing mostly with dependent edema and she does have a negative Homans' sign. From her knee standpoint she has full motion and full strength however pain with terminal flexion with a positive McMurray's sign with pain but no catch. Mild effusion. She is heading the right direction so we will continue conservative treatment in the form of meloxicam, a reaction knee brace and home physical therapy, she will do partial weightbearing with a cane and I can see her back in about 3 weeks, at that point we will consider an injection +/- MRI if not better.

## 2022-11-10 NOTE — Telephone Encounter (Signed)
Patient seen by Dr Benjamin Stain on Monday for this issue.

## 2022-11-18 ENCOUNTER — Encounter: Payer: Self-pay | Admitting: Sports Medicine

## 2022-11-18 DIAGNOSIS — R221 Localized swelling, mass and lump, neck: Secondary | ICD-10-CM | POA: Diagnosis not present

## 2022-11-27 ENCOUNTER — Other Ambulatory Visit: Payer: Self-pay | Admitting: Family Medicine

## 2022-11-27 DIAGNOSIS — E119 Type 2 diabetes mellitus without complications: Secondary | ICD-10-CM

## 2022-11-28 ENCOUNTER — Ambulatory Visit: Payer: Medicare HMO | Admitting: Sports Medicine

## 2022-11-28 ENCOUNTER — Ambulatory Visit (INDEPENDENT_AMBULATORY_CARE_PROVIDER_SITE_OTHER): Payer: Medicare HMO | Admitting: Sports Medicine

## 2022-11-28 ENCOUNTER — Ambulatory Visit: Payer: Medicare HMO

## 2022-11-28 DIAGNOSIS — M7989 Other specified soft tissue disorders: Secondary | ICD-10-CM

## 2022-11-28 DIAGNOSIS — L0291 Cutaneous abscess, unspecified: Secondary | ICD-10-CM | POA: Diagnosis not present

## 2022-11-28 DIAGNOSIS — M79604 Pain in right leg: Secondary | ICD-10-CM | POA: Diagnosis not present

## 2022-11-28 DIAGNOSIS — M79661 Pain in right lower leg: Secondary | ICD-10-CM | POA: Diagnosis not present

## 2022-11-28 DIAGNOSIS — R609 Edema, unspecified: Secondary | ICD-10-CM | POA: Diagnosis not present

## 2022-11-28 DIAGNOSIS — R600 Localized edema: Secondary | ICD-10-CM | POA: Diagnosis not present

## 2022-11-28 DIAGNOSIS — Z9181 History of falling: Secondary | ICD-10-CM | POA: Diagnosis not present

## 2022-11-28 DIAGNOSIS — M25561 Pain in right knee: Secondary | ICD-10-CM | POA: Diagnosis not present

## 2022-11-28 NOTE — Assessment & Plan Note (Signed)
Knee pain is doing a lot better after a fall approximately a month ago.

## 2022-11-28 NOTE — Assessment & Plan Note (Signed)
Janet Mccullough's knee is feeling a lot better since her fall, she is however having some discomfort over her right ankle, medial aspect there is radiation up to the mid lower leg. She does have a palpable subcutaneous mass that feels like a hematoma, organized. She has good motion and good strength in all directions with her ankle so I suspect this is more dependent edema from likely a great saphenous injury. We will get a DVT ultrasound, she will do a lower extremity graduated compression stockings, knee-high, return to see me in 6 weeks. I did advise massage and heating pad as well.

## 2022-11-28 NOTE — Progress Notes (Signed)
    Procedures performed today:    None.  Independent interpretation of notes and tests performed by another provider:   None.  Brief History, Exam, Impression, and Recommendations:    Pain and swelling of lower leg, right Tyjae's knee is feeling a lot better since her fall, she is however having some discomfort over her right ankle, medial aspect there is radiation up to the mid lower leg. She does have a palpable subcutaneous mass that feels like a hematoma, organized. She has good motion and good strength in all directions with her ankle so I suspect this is more dependent edema from likely a great saphenous injury. We will get a DVT ultrasound, she will do a lower extremity graduated compression stockings, knee-high, return to see me in 6 weeks. I did advise massage and heating pad as well.  Acute pain of right knee Knee pain is doing a lot better after a fall approximately a month ago.    ____________________________________________ Ihor Austin. Benjamin Stain, M.D., ABFM., CAQSM., AME. Primary Care and Sports Medicine [facility]  Adjunct Professor of Family Medicine  [place] [employer] [facility] [employer]  Restaurant manager, fast food

## 2022-11-30 ENCOUNTER — Other Ambulatory Visit: Payer: Self-pay | Admitting: Family Medicine

## 2022-11-30 ENCOUNTER — Ambulatory Visit: Payer: Medicare HMO

## 2022-11-30 DIAGNOSIS — Z1231 Encounter for screening mammogram for malignant neoplasm of breast: Secondary | ICD-10-CM

## 2022-12-02 NOTE — Progress Notes (Signed)
Please call patient. Normal mammogram.  Repeat in 1 year.  

## 2023-01-09 ENCOUNTER — Ambulatory Visit (INDEPENDENT_AMBULATORY_CARE_PROVIDER_SITE_OTHER): Payer: Medicare HMO | Admitting: Sports Medicine

## 2023-01-09 DIAGNOSIS — M79661 Pain in right lower leg: Secondary | ICD-10-CM

## 2023-01-09 DIAGNOSIS — M7989 Other specified soft tissue disorders: Secondary | ICD-10-CM

## 2023-01-09 NOTE — Progress Notes (Signed)
    Procedures performed today:    None.  Independent interpretation of notes and tests performed by another provider:   None.  Brief History, Exam, Impression, and Recommendations:    Pain and swelling of lower leg, right Janet Mccullough is here for follow-up of swelling right lower leg, medial aspect of right ankle. We did suspect a hematoma, I obtained a DVT ultrasound, negative for DVT but it did show a drainable collection, likely organizing hematoma. She has done well with lower extremity graduated compression stockings and symptoms continue to improve. She does have still minimal pain in the location. As she is continuing to improve we will stay the course, if persistent discomfort at 6 weeks we will probably consider aspiration of what would likely be a seroma at that juncture, and potential MRI of the ankle.    ____________________________________________ Ihor Austin. Benjamin Stain, M.D., ABFM., CAQSM., AME. Primary Care and Sports Medicine [facility]  Adjunct Professor of Family Medicine  [place] [employer] [facility] [employer]  Restaurant manager, fast food

## 2023-01-09 NOTE — Assessment & Plan Note (Signed)
Janet Mccullough is here for follow-up of swelling right lower leg, medial aspect of right ankle. We did suspect a hematoma, I obtained a DVT ultrasound, negative for DVT but it did show a drainable collection, likely organizing hematoma. She has done well with lower extremity graduated compression stockings and symptoms continue to improve. She does have still minimal pain in the location. As she is continuing to improve we will stay the course, if persistent discomfort at 6 weeks we will probably consider aspiration of what would likely be a seroma at that juncture, and potential MRI of the ankle.

## 2023-01-18 DIAGNOSIS — D225 Melanocytic nevi of trunk: Secondary | ICD-10-CM | POA: Diagnosis not present

## 2023-01-18 DIAGNOSIS — L814 Other melanin hyperpigmentation: Secondary | ICD-10-CM | POA: Diagnosis not present

## 2023-01-18 DIAGNOSIS — D2261 Melanocytic nevi of right upper limb, including shoulder: Secondary | ICD-10-CM | POA: Diagnosis not present

## 2023-01-18 DIAGNOSIS — L570 Actinic keratosis: Secondary | ICD-10-CM | POA: Diagnosis not present

## 2023-01-18 DIAGNOSIS — Z85820 Personal history of malignant melanoma of skin: Secondary | ICD-10-CM | POA: Diagnosis not present

## 2023-01-18 DIAGNOSIS — D2262 Melanocytic nevi of left upper limb, including shoulder: Secondary | ICD-10-CM | POA: Diagnosis not present

## 2023-01-26 DIAGNOSIS — R221 Localized swelling, mass and lump, neck: Secondary | ICD-10-CM | POA: Diagnosis not present

## 2023-02-06 ENCOUNTER — Ambulatory Visit (INDEPENDENT_AMBULATORY_CARE_PROVIDER_SITE_OTHER): Payer: Medicare HMO | Admitting: Family Medicine

## 2023-02-06 ENCOUNTER — Encounter: Payer: Self-pay | Admitting: Family Medicine

## 2023-02-06 VITALS — BP 117/55 | HR 72 | Ht 67.0 in | Wt 177.0 lb

## 2023-02-06 DIAGNOSIS — E1169 Type 2 diabetes mellitus with other specified complication: Secondary | ICD-10-CM | POA: Diagnosis not present

## 2023-02-06 DIAGNOSIS — R809 Proteinuria, unspecified: Secondary | ICD-10-CM | POA: Diagnosis not present

## 2023-02-06 DIAGNOSIS — M25561 Pain in right knee: Secondary | ICD-10-CM

## 2023-02-06 DIAGNOSIS — E1129 Type 2 diabetes mellitus with other diabetic kidney complication: Secondary | ICD-10-CM | POA: Diagnosis not present

## 2023-02-06 DIAGNOSIS — Z7984 Long term (current) use of oral hypoglycemic drugs: Secondary | ICD-10-CM | POA: Diagnosis not present

## 2023-02-06 LAB — POCT GLYCOSYLATED HEMOGLOBIN (HGB A1C): Hemoglobin A1C: 7.2 % — AB (ref 4.0–5.6)

## 2023-02-06 NOTE — Assessment & Plan Note (Signed)
He is gradually been getting better which is fantastic.  But still having some trouble with her ankle but plans on following up with Dr. Benjamin Stain in the next couple of weeks.

## 2023-02-06 NOTE — Progress Notes (Signed)
Established Patient Office Visit  Subjective  Patient ID: Janet Mccullough, female    DOB: [birth year]  Age: 73 y.o. MRN: [id number]  Chief Complaint  Patient presents with   Diabetes   Hypertension    HPI  Diabetes - no hypoglycemic events. No wounds or sores that are not healing well. No increased thirst or urination. Checking glucose at home. Taking medications as prescribed without any side effects.  Her right knee been getting better she has been wearing her brace and feels like she is getting a little bit more mobile still occasionally having some medial and posterior pain.  Still having some issues with her right ankle so she is can follow-up with Dr. Benjamin Stain in a couple of weeks for that.     ROS    Objective:     BP (!) 117/55   Pulse 72   Ht 5\' 7"  (1.702 m)   Wt 177 lb (80.3 kg)   SpO2 100%   BMI 27.72 kg/m    Physical Exam Vitals and nursing note reviewed.  Constitutional:      Appearance: Normal appearance.  HENT:     Head: Normocephalic and atraumatic.  Eyes:     Conjunctiva/sclera: Conjunctivae normal.  Cardiovascular:     Rate and Rhythm: Normal rate and regular rhythm.  Pulmonary:     Effort: Pulmonary effort is normal.     Breath sounds: Normal breath sounds.  Skin:    General: Skin is warm and dry.  Neurological:     Mental Status: She is alert.  Psychiatric:        Mood and Affect: Mood normal.      Results for orders placed or performed in visit on 02/06/23  POCT HgB A1C  Result Value Ref Range   Hemoglobin A1C 7.2 (A) 4.0 - 5.6 %   HbA1c POC (<> result, manual entry)     HbA1c, POC (prediabetic range)     HbA1c, POC (controlled diabetic range)        The 10-year ASCVD risk score (Arnett DK, et al., 2019) is: 17.4%    Assessment & Plan:   Problem List Items Addressed This Visit       Endocrine   Type 2 diabetes mellitus with other specified complication (HCC) - Primary   A1c is still elevated at 7.2 which is what it  was back in September.  She does well with her current medication regimen we discussed possibly making some changes today but she feels like she wants to work on being able to increase her activity level she feels like she has been very limited because of her knee and now that she is getting more mobile she wants to see if she can get her A1c down over the next 3 months.      Relevant Orders   POCT HgB A1C (Completed)   CMP14+EGFR   Lipid panel   CBC   Urine Microalbumin w/creat. ratio   Microalbuminuria due to type 2 diabetes mellitus (HCC)   Relevant Orders   CMP14+EGFR   Lipid panel   CBC   Urine Microalbumin w/creat. ratio     Other   Acute pain of right knee   He is gradually been getting better which is fantastic.  But still having some trouble with her ankle but plans on following up with Dr. Benjamin Stain in the next couple of weeks.       Return in about 3 months (around 05/07/2023) for Diabetes follow-up.  Nani Gasser, MD

## 2023-02-06 NOTE — Assessment & Plan Note (Signed)
A1c is still elevated at 7.2 which is what it was back in September.  She does well with her current medication regimen we discussed possibly making some changes today but she feels like she wants to work on being able to increase her activity level she feels like she has been very limited because of her knee and now that she is getting more mobile she wants to see if she can get her A1c down over the next 3 months.

## 2023-02-07 ENCOUNTER — Encounter: Payer: Self-pay | Admitting: Family Medicine

## 2023-02-07 LAB — CMP14+EGFR
ALT: 19 [IU]/L (ref 0–32)
AST: 12 [IU]/L (ref 0–40)
Albumin: 4.6 g/dL (ref 3.8–4.8)
Alkaline Phosphatase: 62 [IU]/L (ref 44–121)
BUN/Creatinine Ratio: 16 (ref 12–28)
BUN: 13 mg/dL (ref 8–27)
Bilirubin Total: 0.6 mg/dL (ref 0.0–1.2)
CO2: 20 mmol/L (ref 20–29)
Calcium: 9.5 mg/dL (ref 8.7–10.3)
Chloride: 103 mmol/L (ref 96–106)
Creatinine, Ser: 0.8 mg/dL (ref 0.57–1.00)
Globulin, Total: 2.2 g/dL (ref 1.5–4.5)
Glucose: 154 mg/dL — ABNORMAL HIGH (ref 70–99)
Potassium: 4.6 mmol/L (ref 3.5–5.2)
Sodium: 140 mmol/L (ref 134–144)
Total Protein: 6.8 g/dL (ref 6.0–8.5)
eGFR: 78 mL/min/{1.73_m2} (ref >59)

## 2023-02-07 LAB — LIPID PANEL
Chol/HDL Ratio: 2.9 {ratio} (ref 0.0–4.4)
Cholesterol, Total: 207 mg/dL — ABNORMAL HIGH (ref 100–199)
HDL: 72 mg/dL (ref >39)
LDL Chol Calc (NIH): 120 mg/dL — ABNORMAL HIGH (ref 0–99)
Triglycerides: 86 mg/dL (ref 0–149)
VLDL Cholesterol Cal: 15 mg/dL (ref 5–40)

## 2023-02-07 LAB — CBC
Hematocrit: 42.9 % (ref 34.0–46.6)
Hemoglobin: 14.6 g/dL (ref 11.1–15.9)
MCH: 30.8 pg (ref 26.6–33.0)
MCHC: 34 g/dL (ref 31.5–35.7)
MCV: 91 fL (ref 79–97)
Platelets: 243 10*3/uL (ref 150–450)
RBC: 4.74 x10E6/uL (ref 3.77–5.28)
RDW: 13 % (ref 11.7–15.4)
WBC: 6.1 10*3/uL (ref 3.4–10.8)

## 2023-02-07 LAB — MICROALBUMIN / CREATININE URINE RATIO
Creatinine, Urine: 63.9 mg/dL
Microalb/Creat Ratio: <5 mg/g{creat} (ref 0–29)
Microalbumin, Urine: <3 ug/mL

## 2023-02-07 NOTE — Progress Notes (Signed)
Hi Ryenne, metabolic panel looks good.  Total Lester all and LDL are mildly elevated with an LDL of 120.  Goal is under 100 continue work on healthy diet and regular exercise to improve those numbers and reduce your cardiovascular risk.  Blood count looks great no sign of anemia.  Urine specimen still pending.

## 2023-02-07 NOTE — Progress Notes (Signed)
No excess protein in the urine which is perfect.

## 2023-02-20 ENCOUNTER — Ambulatory Visit: Payer: Medicare HMO | Admitting: Sports Medicine

## 2023-02-28 ENCOUNTER — Ambulatory Visit: Payer: Medicare HMO

## 2023-02-28 ENCOUNTER — Ambulatory Visit (INDEPENDENT_AMBULATORY_CARE_PROVIDER_SITE_OTHER): Payer: Medicare HMO | Admitting: Sports Medicine

## 2023-02-28 DIAGNOSIS — M7989 Other specified soft tissue disorders: Secondary | ICD-10-CM | POA: Diagnosis not present

## 2023-02-28 DIAGNOSIS — M79661 Pain in right lower leg: Secondary | ICD-10-CM | POA: Diagnosis not present

## 2023-02-28 NOTE — Progress Notes (Signed)
    Procedures performed today:    None.  Independent interpretation of notes and tests performed by another provider:   None.  Brief History, Exam, Impression, and Recommendations:    Pain and swelling of lower leg, right Demiyah returns, she is a very pleasant 73 year old female, she has persistent swelling right lower leg medial aspect right ankle. We suspected a hematoma, DVT ultrasound was negative for clot but it did show a drainable collection in the subcutaneous tissues, potentially an organizing hematoma. We had her do lower extremity graduated compression stockings which have helped to some degree. I saw her approximately 6 weeks ago, unfortunately she continues to have some mild discomfort and swelling. At this point rather than simply performing an aspiration we will proceed with MRI, with the knowledge that this may result in an ultrasound-guided aspiration of what is likely a seroma. She has not had x-rays so we will get these today.    ____________________________________________ Ihor Austin. Benjamin Stain, M.D., ABFM., CAQSM., AME. Primary Care and Sports Medicine [facility] [facility]  Adjunct Professor of Family Medicine  [place] [employer] [facility] [employer]  Restaurant manager, fast food

## 2023-02-28 NOTE — Assessment & Plan Note (Signed)
 Janet Mccullough returns, she is a very pleasant 73 year old female, she has persistent swelling right lower leg medial aspect right ankle. We suspected a hematoma, DVT ultrasound was negative for clot but it did show a drainable collection in the subcutaneous tissues, potentially an organizing hematoma. We had her do lower extremity graduated compression stockings which have helped to some degree. I saw her approximately 6 weeks ago, unfortunately she continues to have some mild discomfort and swelling. At this point rather than simply performing an aspiration we will proceed with MRI, with the knowledge that this may result in an ultrasound-guided aspiration of what is likely a seroma. She has not had x-rays so we will get these today.

## 2023-03-07 ENCOUNTER — Ambulatory Visit: Payer: Medicare HMO

## 2023-03-07 DIAGNOSIS — M79661 Pain in right lower leg: Secondary | ICD-10-CM | POA: Diagnosis not present

## 2023-03-07 DIAGNOSIS — M7989 Other specified soft tissue disorders: Secondary | ICD-10-CM | POA: Diagnosis not present

## 2023-03-07 DIAGNOSIS — M19071 Primary osteoarthritis, right ankle and foot: Secondary | ICD-10-CM | POA: Diagnosis not present

## 2023-03-07 DIAGNOSIS — M25471 Effusion, right ankle: Secondary | ICD-10-CM | POA: Diagnosis not present

## 2023-03-15 ENCOUNTER — Encounter (INDEPENDENT_AMBULATORY_CARE_PROVIDER_SITE_OTHER): Payer: Self-pay | Admitting: Sports Medicine

## 2023-03-15 DIAGNOSIS — M7989 Other specified soft tissue disorders: Secondary | ICD-10-CM | POA: Diagnosis not present

## 2023-03-15 DIAGNOSIS — M79661 Pain in right lower leg: Secondary | ICD-10-CM | POA: Diagnosis not present

## 2023-03-17 NOTE — Telephone Encounter (Signed)

## 2023-03-31 DIAGNOSIS — R221 Localized swelling, mass and lump, neck: Secondary | ICD-10-CM | POA: Diagnosis not present

## 2023-05-08 ENCOUNTER — Ambulatory Visit (INDEPENDENT_AMBULATORY_CARE_PROVIDER_SITE_OTHER): Payer: Medicare HMO | Admitting: Family Medicine

## 2023-05-08 ENCOUNTER — Encounter: Payer: Self-pay | Admitting: Family Medicine

## 2023-05-08 VITALS — BP 102/71 | HR 90 | Ht 67.0 in | Wt 176.0 lb

## 2023-05-08 DIAGNOSIS — R809 Proteinuria, unspecified: Secondary | ICD-10-CM

## 2023-05-08 DIAGNOSIS — M25561 Pain in right knee: Secondary | ICD-10-CM

## 2023-05-08 DIAGNOSIS — E1129 Type 2 diabetes mellitus with other diabetic kidney complication: Secondary | ICD-10-CM

## 2023-05-08 DIAGNOSIS — E1169 Type 2 diabetes mellitus with other specified complication: Secondary | ICD-10-CM | POA: Diagnosis not present

## 2023-05-08 DIAGNOSIS — Z7984 Long term (current) use of oral hypoglycemic drugs: Secondary | ICD-10-CM

## 2023-05-08 LAB — POCT GLYCOSYLATED HEMOGLOBIN (HGB A1C): Hemoglobin A1C: 6.9 % — AB (ref 4.0–5.6)

## 2023-05-08 NOTE — Assessment & Plan Note (Signed)
 Continue dapagliflozin .

## 2023-05-08 NOTE — Assessment & Plan Note (Signed)
 A1C looks great at 6.9 today!! Improved from last OV.  She says she is trying to be more active since she was last year and still trying to work on her diet.  She has been tolerating the medication well without any side effects though it has been quite pricey.

## 2023-05-08 NOTE — Assessment & Plan Note (Signed)
 Occasionally gets a pain behind her knee and medially but overall it is better.

## 2023-05-08 NOTE — Progress Notes (Signed)
   Established Patient Office Visit  Subjective  Patient ID: Janet Mccullough, female    DOB: 10/01/1950  Age: 73 y.o. MRN: [id number]  Chief Complaint  Patient presents with   Diabetes    HPI  Diabetes - no hypoglycemic events. No wounds or sores that are not healing well. No increased thirst or urination. Checking glucose at home. Taking medications as prescribed without any side effects.  Getting over a URI, still has a slight cough but she is feeling better.    Did follow-up with the ENT and they did a repeat ultrasound and then eventually ended up doing a CT scan but everything checked out okay they said it was just a benign cyst and did not need any further workup.  Says her right ankle and lower extremity swelling has gotten some better her discomfort is also improved she is just trying to still do the exercises.     ROS    Objective:     BP 102/71   Pulse 90   Ht 5\' 7"  (1.702 m)   Wt 176 lb (79.8 kg)   SpO2 100%   BMI 27.57 kg/m    Physical Exam Vitals and nursing note reviewed.  Constitutional:      Appearance: Normal appearance.  HENT:     Head: Normocephalic and atraumatic.  Eyes:     Conjunctiva/sclera: Conjunctivae normal.  Cardiovascular:     Rate and Rhythm: Normal rate and regular rhythm.  Pulmonary:     Effort: Pulmonary effort is normal.     Breath sounds: Normal breath sounds.  Skin:    General: Skin is warm and dry.  Neurological:     Mental Status: She is alert.  Psychiatric:        Mood and Affect: Mood normal.     Results for orders placed or performed in visit on 05/08/23  POCT HgB A1C  Result Value Ref Range   Hemoglobin A1C 6.9 (A) 4.0 - 5.6 %   HbA1c POC (<> result, manual entry)     HbA1c, POC (prediabetic range)     HbA1c, POC (controlled diabetic range)        The 10-year ASCVD risk score (Arnett DK, et al., 2019) is: 15.7%    Assessment & Plan:   Problem List Items Addressed This Visit       Endocrine   Type 2  diabetes mellitus with other specified complication (HCC) - Primary   A1C looks great at 6.9 today!! Improved from last OV.  She says she is trying to be more active since she was last year and still trying to work on her diet.  She has been tolerating the medication well without any side effects though it has been quite pricey.      Relevant Orders   POCT HgB A1C (Completed)   Microalbuminuria due to type 2 diabetes mellitus (HCC)   Continue dapagliflozin .        Other   Acute pain of right knee   Occasionally gets a pain behind her knee and medially but overall it is better.      Declined Tdap.    Return in about 4 months (around 09/07/2023) for Diabetes follow-up.    Duaine German, MD

## 2023-05-22 ENCOUNTER — Other Ambulatory Visit: Payer: Self-pay

## 2023-05-22 MED ORDER — DAPAGLIFLOZIN PRO-METFORMIN ER 10-500 MG PO TB24: 1.0000 | ORAL_TABLET | Freq: Every morning | ORAL | 1 refills | Status: DC

## 2023-06-09 IMAGING — MG MM DIGITAL SCREENING UNILAT*R* W/ TOMO W/ CAD
4 series · 4 of 12 positions shown · non-contrast
Comparison: Previous exam(s).

CLINICAL DATA: Screening.

EXAM:
DIGITAL SCREENING UNILATERAL RIGHT MAMMOGRAM WITH CAD AND
TOMOSYNTHESIS
TECHNIQUE: Right screening digital craniocaudal and mediolateral oblique
mammograms were obtained. Right screening digital breast
tomosynthesis was performed. The images were evaluated with
computer-aided detection.

[R MLO synth-2D]
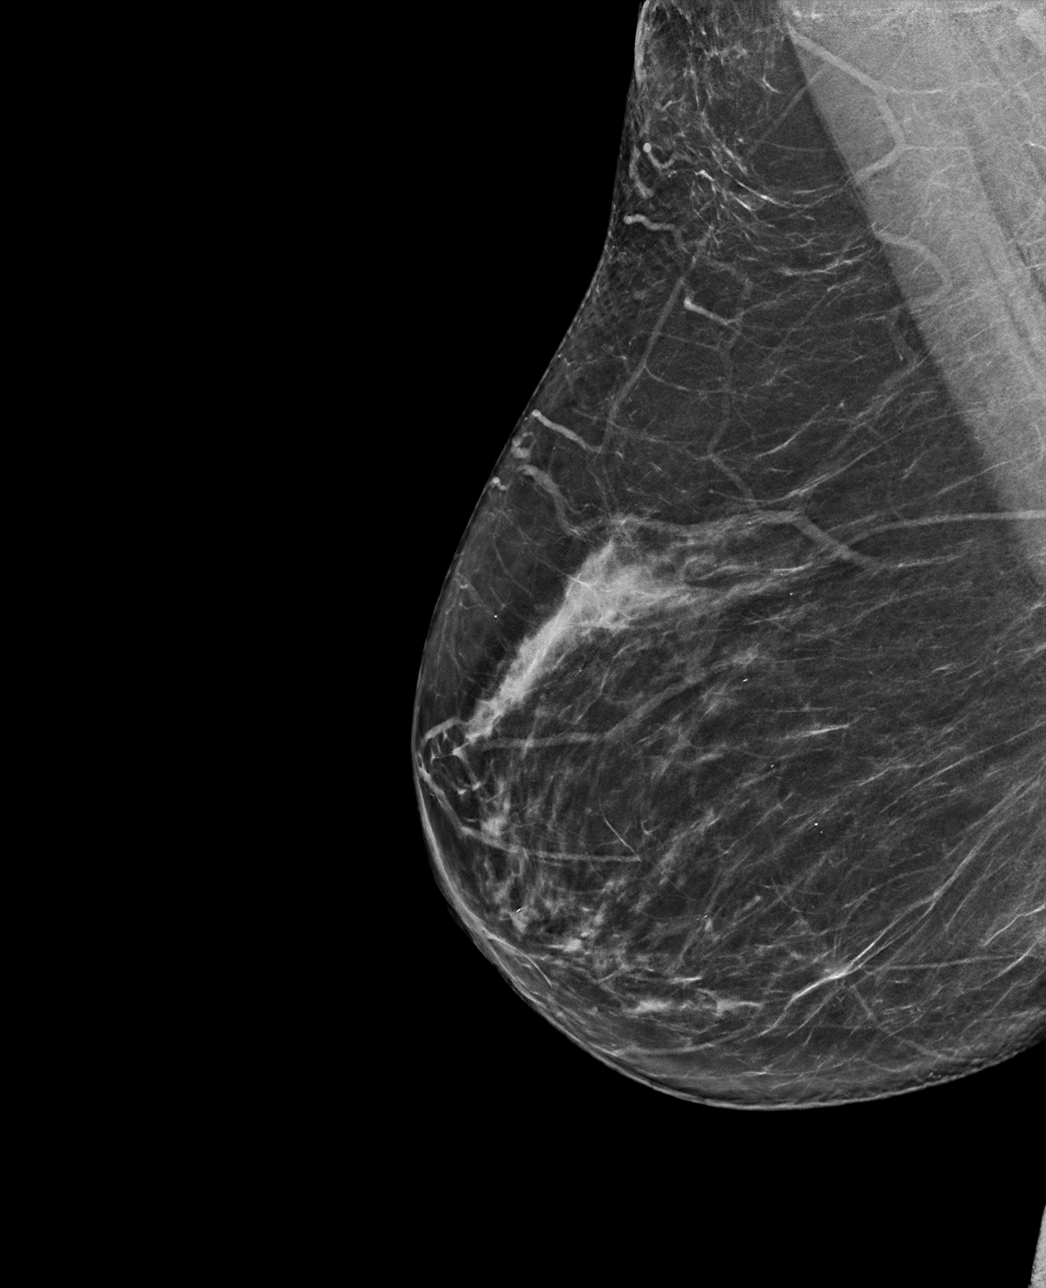

[R CC synth-2D]
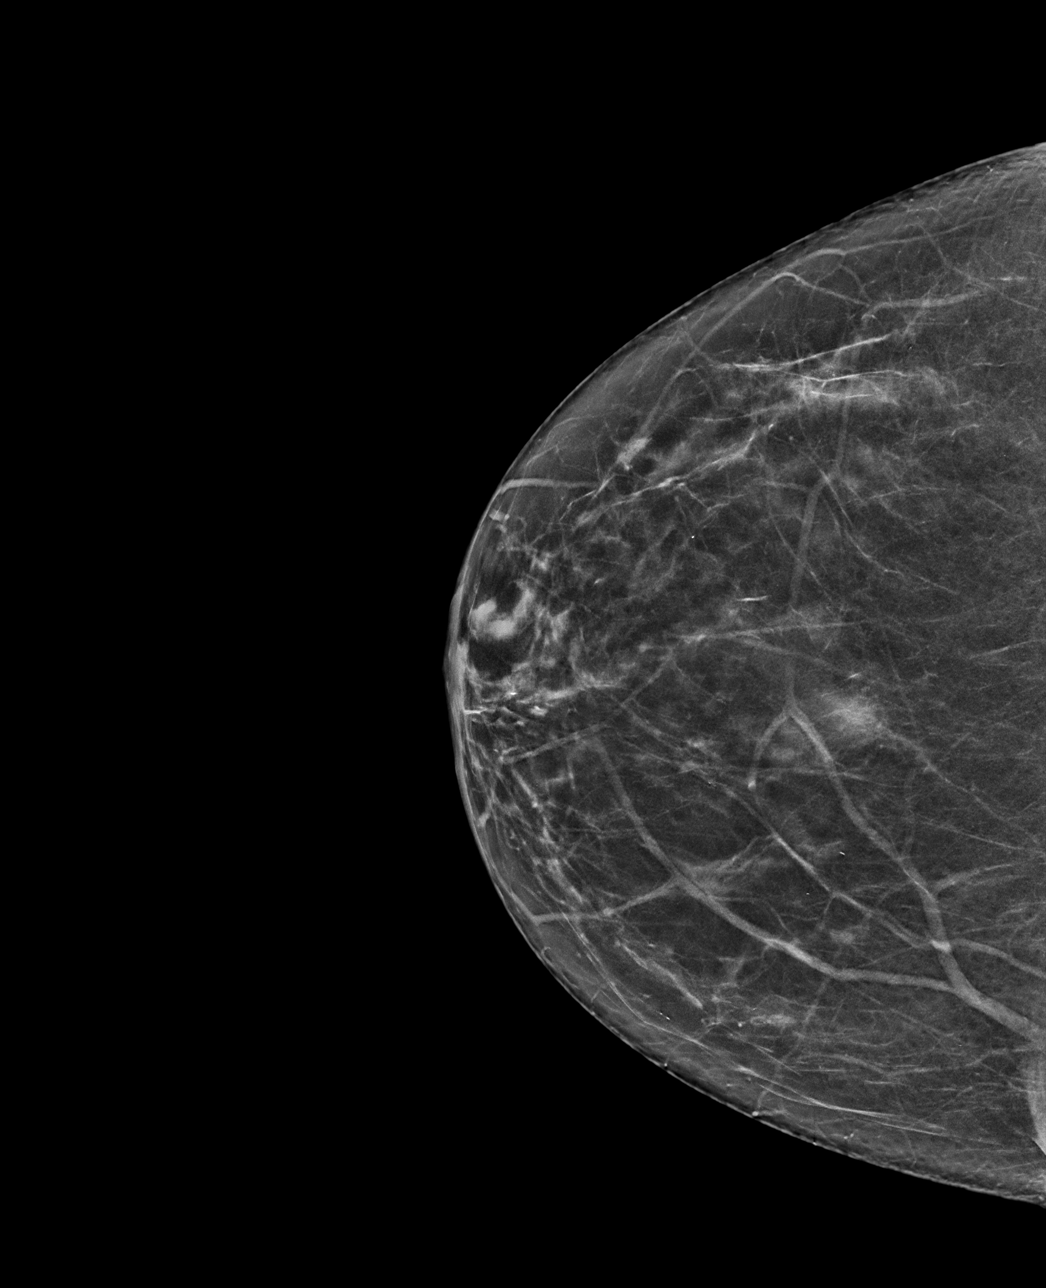

[R CC tomo · tomo slice 33/66.0]
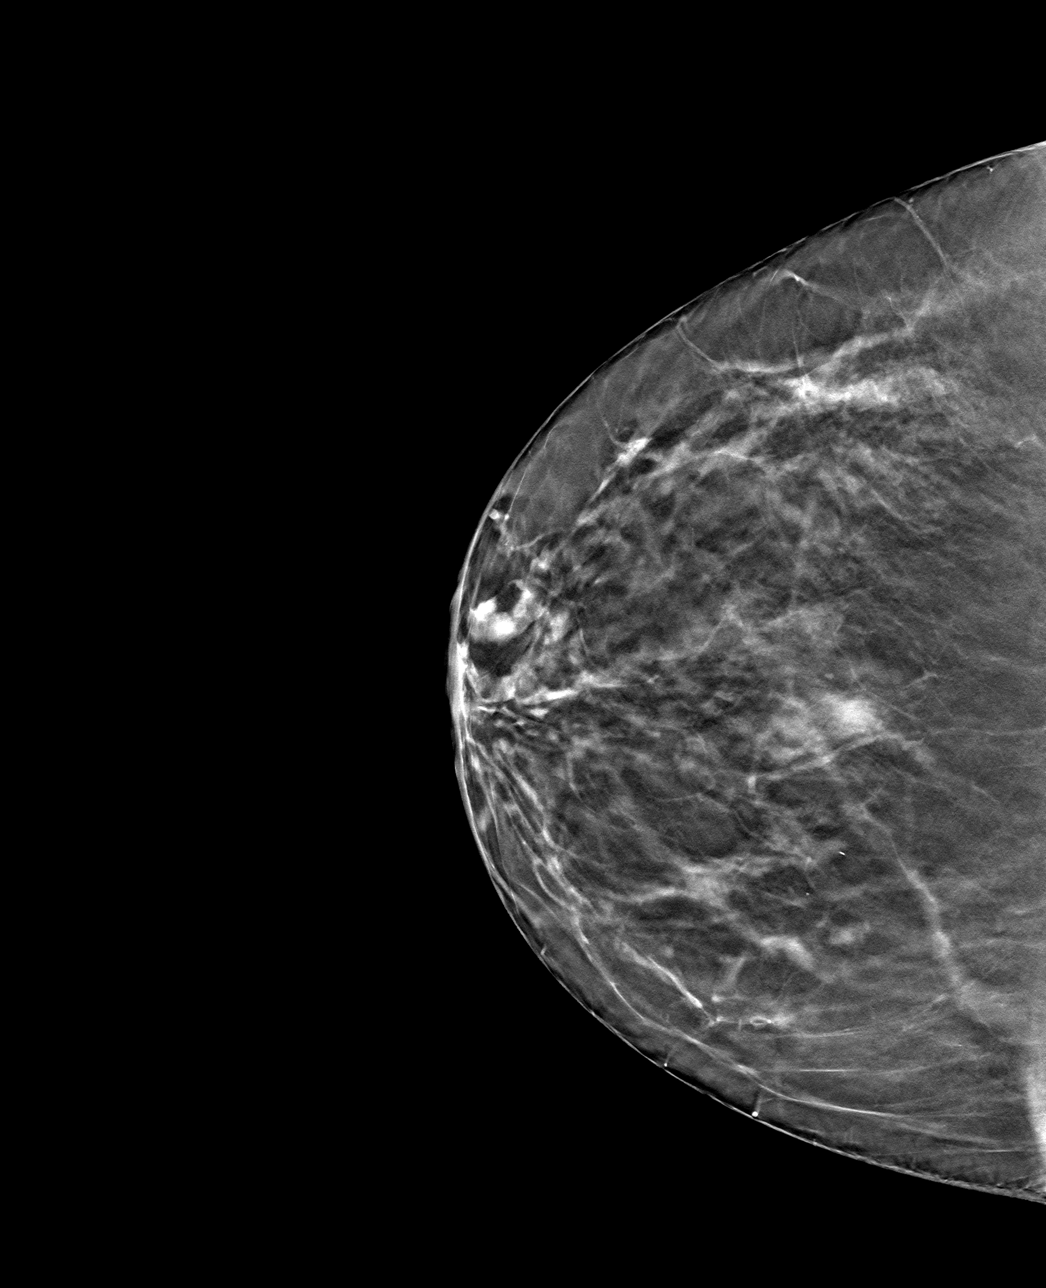

[R MLO tomo · tomo slice 39/78.0]
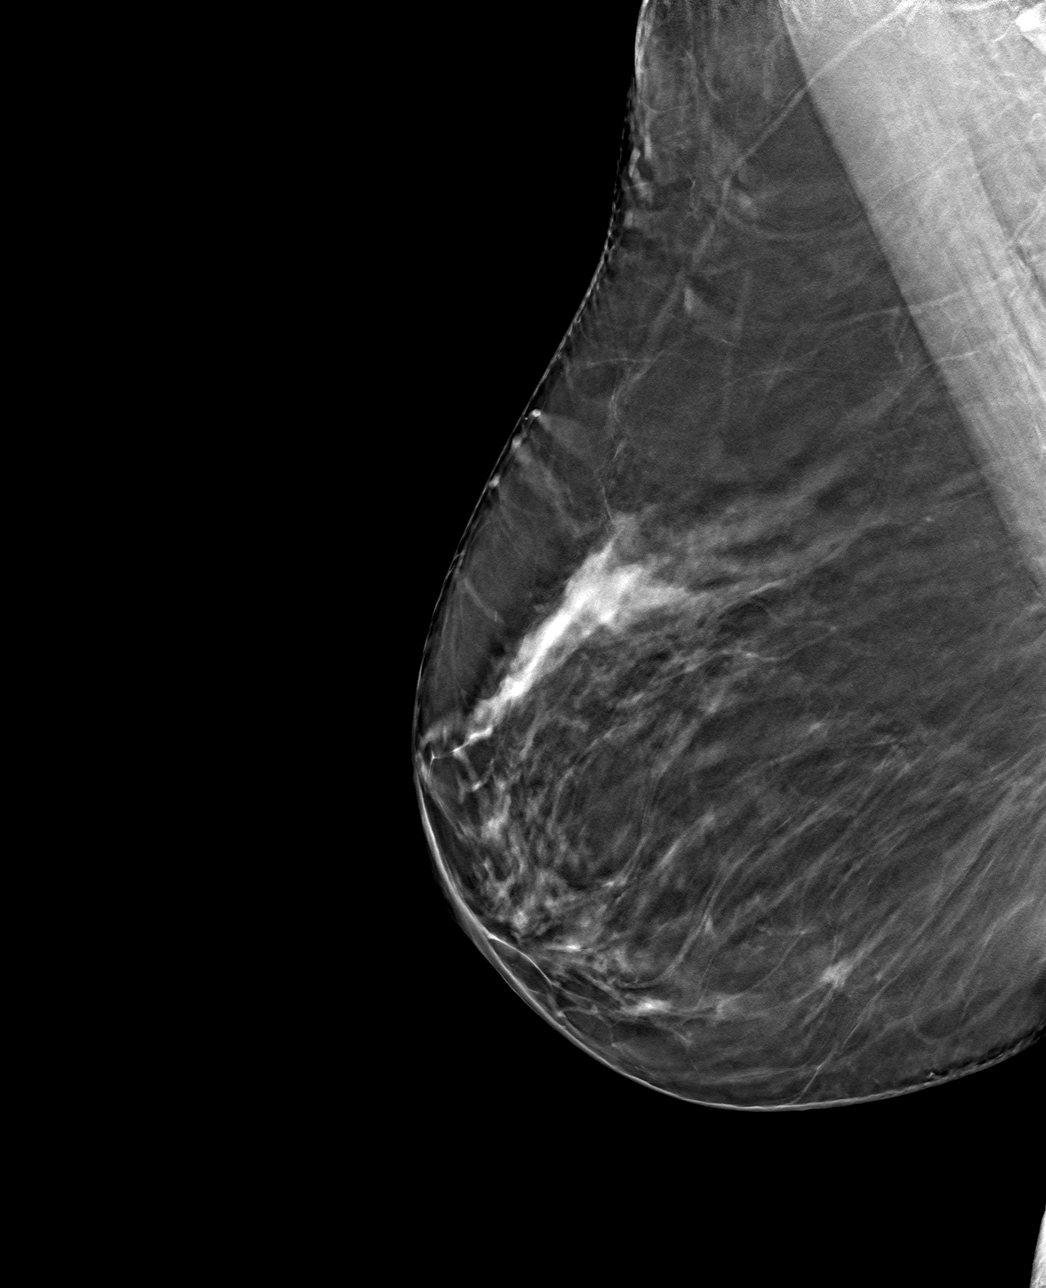

[4 of 12 positions shown; findings below may reference images not displayed]

ACR Breast Density Category b: There are scattered areas of
fibroglandular density.
FINDINGS: There are no findings suspicious for malignancy.
IMPRESSION: No mammographic evidence of malignancy. A result letter of this
screening mammogram will be mailed directly to the patient.

RECOMMENDATION:
Screening mammogram in one year. (Code:C3-I-RW2)

BI-RADS CATEGORY  1: Negative.

## 2023-06-28 DIAGNOSIS — H04123 Dry eye syndrome of bilateral lacrimal glands: Secondary | ICD-10-CM | POA: Diagnosis not present

## 2023-06-28 DIAGNOSIS — Z135 Encounter for screening for eye and ear disorders: Secondary | ICD-10-CM | POA: Diagnosis not present

## 2023-06-28 DIAGNOSIS — H5203 Hypermetropia, bilateral: Secondary | ICD-10-CM | POA: Diagnosis not present

## 2023-06-28 DIAGNOSIS — E119 Type 2 diabetes mellitus without complications: Secondary | ICD-10-CM | POA: Diagnosis not present

## 2023-06-28 DIAGNOSIS — H2513 Age-related nuclear cataract, bilateral: Secondary | ICD-10-CM | POA: Diagnosis not present

## 2023-06-28 DIAGNOSIS — H524 Presbyopia: Secondary | ICD-10-CM | POA: Diagnosis not present

## 2023-06-28 LAB — HM DIABETES EYE EXAM

## 2023-07-06 ENCOUNTER — Ambulatory Visit

## 2023-07-06 VITALS — Ht 67.0 in | Wt 170.0 lb

## 2023-07-06 DIAGNOSIS — Z0000 Encounter for general adult medical examination without abnormal findings: Secondary | ICD-10-CM

## 2023-07-06 NOTE — Progress Notes (Signed)
   07/06/2023  Patient ID: Janet Mccullough, female   DOB: 1950/12/19, 73 y.o.   MRN: [id number]  This patient is appearing on a report for being at risk of failing the adherence measure for diabetes medications this calendar year.   Medication: Xigduo  XR 10/500mg  Last fill date: 07/02/2023 for 30 day supply  Insurance report was not up to date. No action needed at this time.   Channing DELENA Mealing, PharmD, DPLA

## 2023-07-06 NOTE — Patient Instructions (Signed)
 Janet Mccullough , Thank you for taking time to come for your Medicare Wellness Visit. I appreciate your ongoing commitment to your health goals. Please review the following plan we discussed and let me know if I can assist you in the future.   These are the goals we discussed:  Goals       Medication Management      Patient Goals/Self-Care Activities Over the next 90 days, patient will:  take medications as prescribed, target a minimum of 150 minutes of moderate intensity exercise weekly, and engage in dietary modifications by opting for lean meats + veggies, and consuming carbs/sugar in moderation.  Follow Up Plan: Telephone follow up appointment with care management team member scheduled for:  3-6 months       Patient Stated (pt-stated)      05/20/2020 AWV Goal: Diabetes Management  Patient will maintain an A1C level below 7.5 Patient will not develop any diabetic foot complications Patient will not experience any hypoglycemic episodes over the next 3 months Patient will notify our office of any CBG readings outside of the provider recommended range by calling [phone] Patient will adhere to provider recommendations for diabetes management  Patient Self Management Activities take all medications as prescribed and report any negative side effects monitor and record blood sugar readings as directed adhere to a low carbohydrate diet that incorporates lean proteins, vegetables, whole grains, low glycemic fruits check feet daily noting any sores, cracks, injuries, or callous formations see PCP or podiatrist if she notices any changes in her legs, feet, or toenails Patient will visit PCP and have an A1C level checked every 3 to 6 months as directed  have a yearly eye exam to monitor for vascular changes associated with diabetes and will request that the report be sent to her pcp.  consult with her PCP regarding any changes in her health or new or worsening symptoms       Patient Stated  (pt-stated)      07/01/2021 AWV Goal: Diabetes Management  Patient will maintain an A1C level below 8.0 Patient will not develop any diabetic foot complications Patient will not experience any hypoglycemic episodes over the next 3 months Patient will notify our office of any CBG readings outside of the provider recommended range by calling [phone] Patient will adhere to provider recommendations for diabetes management  Patient Self Management Activities take all medications as prescribed and report any negative side effects monitor and record blood sugar readings as directed adhere to a low carbohydrate diet that incorporates lean proteins, vegetables, whole grains, low glycemic fruits check feet daily noting any sores, cracks, injuries, or callous formations see PCP or podiatrist if she notices any changes in her legs, feet, or toenails Patient will visit PCP and have an A1C level checked every 3 to 6 months as directed  have a yearly eye exam to monitor for vascular changes associated with diabetes and will request that the report be sent to her pcp.  consult with her PCP regarding any changes in her health or new or worsening symptoms       Patient Stated (pt-stated)      07/05/2022 AWV Goal: Diabetes Management  Patient will maintain an A1C level below 8.0 Patient will not develop any diabetic foot complications Patient will not experience any hypoglycemic episodes over the next 3 months Patient will notify our office of any CBG readings outside of the provider recommended range by calling [phone] Patient will adhere to provider recommendations for diabetes  management  Patient Self Management Activities take all medications as prescribed and report any negative side effects monitor and record blood sugar readings as directed adhere to a low carbohydrate diet that incorporates lean proteins, vegetables, whole grains, low glycemic fruits check feet daily noting any sores,  cracks, injuries, or callous formations see PCP or podiatrist if she notices any changes in her legs, feet, or toenails Patient will visit PCP and have an A1C level checked every 3 to 6 months as directed  have a yearly eye exam to monitor for vascular changes associated with diabetes and will request that the report be sent to her pcp.  consult with her PCP regarding any changes in her health or new or worsening symptoms       Patient Stated      Patient states she would like to get A1c down to 7.0.        This is a list of the screening recommended for you and due dates:  Health Maintenance  Topic Date Due   Pneumococcal Vaccine for age over 37 (1 of 2 - PCV) Never done   Zoster (Shingles) Vaccine (1 of 2) 10/10/2023*   DTaP/Tdap/Td vaccine (2 - Tdap) 05/07/2024*   COVID-19 Vaccine (1) 05/07/2024*   Flu Shot  08/11/2023   Hemoglobin A1C  11/07/2023   Yearly kidney function blood test for diabetes  02/06/2024   Yearly kidney health urinalysis for diabetes  02/06/2024   Complete foot exam   05/07/2024   Eye exam for diabetics  06/27/2024   Medicare Annual Wellness Visit  07/05/2024   Mammogram  11/29/2024   Cologuard (Stool DNA test)  10/11/2025   DEXA scan (bone density measurement)  04/05/2032   Hepatitis C Screening  Completed   Hepatitis B Vaccine  Aged Out   HPV Vaccine  Aged Out   Meningitis B Vaccine  Aged Out  *Topic was postponed. The date shown is not the original due date.

## 2023-07-06 NOTE — Progress Notes (Signed)
 Subjective:   Janet Mccullough is a 73 y.o. female who presents for Medicare Annual (Subsequent) preventive examination.  Visit Complete: Virtual I connected with  Reena Bihari on 07/06/23 by a audio enabled telemedicine application and verified that I am speaking with the correct person using two identifiers.  Patient Location: Home  Provider Location: Office/Clinic  I discussed the limitations of evaluation and management by telemedicine. The patient expressed understanding and agreed to proceed.  Vital Signs: Because this visit was a virtual/telehealth visit, some criteria may be missing or patient reported. Any vitals not documented were not able to be obtained and vitals that have been documented are patient reported.  Patient Medicare AWV questionnaire was completed by the patient on n/a; I have confirmed that all information answered by patient is correct and no changes since this date.  Cardiac Risk Factors include: advanced age (>13men, >67 women);diabetes mellitus;family history of premature cardiovascular disease     Objective:    Today's Vitals   07/06/23 1301  Weight: 170 lb (77.1 kg)  Height: 5' 7 (1.702 m)   Body mass index is 26.63 kg/m.     07/06/2023    1:11 PM 07/05/2022   10:56 AM 07/01/2021    3:56 PM 05/20/2020   11:29 AM 10/03/2016   10:17 AM  Advanced Directives  Does Patient Have a Medical Advance Directive? Yes Yes Yes Yes Yes   Type of Estate agent of [place];Living will Living will Living will Healthcare Power of [place];Living will Healthcare Power of [place];Living will  Does patient want to make changes to medical advance directive? No - Patient declined No - Patient declined No - Patient declined No - Patient declined   Copy of Healthcare Power of Attorney in Chart? No - copy requested   No - copy requested No - copy requested   Would patient like information on creating a medical advance directive?    No - Patient  declined      Data saved with a previous flowsheet row definition    Current Medications (verified) Outpatient Encounter Medications as of 07/06/2023  Medication Sig   AMBULATORY NON FORMULARY MEDICATION Take 1 capsule by mouth in the morning and at bedtime. Medication Name: BONE BUILDING SUPPLEMENT   B Complex Vitamins (B-COMPLEX/B-12 SL) Place 1 tablet under the tongue daily.   Blood Glucose Monitoring Suppl (ONE TOUCH ULTRA SYSTEM KIT) W/DEVICE KIT 1 kit by Does not apply route once. Lancets, test strips for testing 3 times a week. Dx: IFG Dx code: R73.01   Cholecalciferol (VITAMIN D3) 125 MCG (5000 UT) CAPS Take 1 capsule by mouth daily. 188mcg (7500 units) per integrative medicine   Dapagliflozin  Pro-metFORMIN  ER 10-500 MG TB24 Take 1 tablet by mouth in the morning.   Lancets (ONETOUCH DELICA PLUS LANCET33G) MISC USE 1  TO CHECK GLUCOSE IN THE MORNING   MAGNESIUM PO Take 1,350 mg by mouth daily. Mg Maleate (3 capsule daily)   Misc Natural Products (ADV TURMERIC CURCUMIN COMPLEX) CAPS Take 1 capsule by mouth daily.   ONETOUCH ULTRA test strip USE 1 STRIP TO CHECK FASTING GLUCOSE ONCE DAILY   Saccharomyces boulardii (PROBIOTIC) 250 MG CAPS Take by mouth.   Vitamin Mixture (VITAMIN C) LIQD Take 1,000 mg by mouth daily.   [DISCONTINUED] meloxicam  (MOBIC ) 15 MG tablet One tab PO every 24 hours with a meal for 2 weeks, then once every 24 hours prn pain.   No facility-administered encounter medications on file as of 07/06/2023.  Allergies (verified) Moxifloxacin hcl in nacl, Penicillins, Quinolones, and Semaglutide    History: Past Medical History:  Diagnosis Date   Allergy as child   Pennicillin, Avalox (few years ago)   Arthritis    Blood transfusion without reported diagnosis [phone]   During surgery   Cancer (HCC)    breast   GERD (gastroesophageal reflux disease) Late 80's   no current problems   Impaired fasting glucose    Microscopic hematuria    renal ultrasound normal (Dr  Matilda)   Normal echocardiogram    EF 60-65%   Past Surgical History:  Procedure Laterality Date   ABDOMINAL HYSTERECTOMY  07/2001   for DUB/ cysts   BREAST SURGERY  1987   Mastectomy - left   MASTECTOMY Left    TUBAL LIGATION  1979   uterine cyst removed  05/1997   Family History  Problem Relation Age of Onset   Diabetes Mother    Diabetes Father    Heart disease Father    Stroke Father    Diabetes Brother    Diabetes Brother        type 2   ADD / ADHD Son    Social History   Socioeconomic History   Marital status: Widowed    Spouse name: Not on file   Number of children: 2   Years of education: 51   Highest education level: Some college, no degree  Occupational History    Comment: Retired  Tobacco Use   Smoking status: Never   Smokeless tobacco: Never  Substance and Sexual Activity   Alcohol use: Not Currently    Comment: Occasionally   Drug use: Never   Sexual activity: Not Currently    Birth control/protection: None  Other Topics Concern   Not on file  Social History Narrative   Lives alone. She has two children of her own and two step children. She enjoys doing crafts, read and learning how to quilt.   Social Drivers of Corporate investment banker Strain: Low Risk  (07/06/2023)   Overall Financial Resource Strain (CARDIA)    Difficulty of Paying Living Expenses: Not hard at all  Food Insecurity: No Food Insecurity (07/06/2023)   Hunger Vital Sign    Worried About Running Out of Food in the Last Year: Never true    Ran Out of Food in the Last Year: Never true  Transportation Needs: No Transportation Needs (07/06/2023)   PRAPARE - Administrator, Civil Service (Medical): No    Lack of Transportation (Non-Medical): No  Physical Activity: Insufficiently Active (07/06/2023)   Exercise Vital Sign    Days of Exercise per Week: 3 days    Minutes of Exercise per Session: 10 min  Stress: No Stress Concern Present (07/06/2023)   [employer]  of Occupational Health - Occupational Stress Questionnaire    Feeling of Stress: Not at all  Social Connections: Moderately Integrated (07/06/2023)   Social Connection and Isolation Panel    Frequency of Communication with Friends and Family: More than three times a week    Frequency of Social Gatherings with Friends and Family: More than three times a week    Attends Religious Services: More than 4 times per year    Active Member of [employer] or Organizations: Yes    Attends Banker Meetings: More than 4 times per year    Marital Status: Widowed    Tobacco Counseling Counseling given: Not Answered   Clinical Intake:  Pre-visit preparation  completed: Yes  Pain : No/denies pain     BMI - recorded: 26.63 Nutritional Status: BMI 25 -29 Overweight Nutritional Risks: None Diabetes: Yes CBG done?: Yes (120) CBG resulted in Enter/ Edit results?: No Did pt. bring in CBG monitor from home?: No  How often do you need to have someone help you when you read instructions, pamphlets, or other written materials from your doctor or pharmacy?: 1 - Never What is the last grade level you completed in school?: 13  Interpreter Needed?: No      Activities of Daily Living    07/06/2023    1:04 PM 07/04/2023    1:15 PM  In your present state of health, do you have any difficulty performing the following activities:  Hearing? 0 0  Vision? 0 0  Difficulty concentrating or making decisions? 0 0  Walking or climbing stairs? 0 0  Dressing or bathing? 0 0  Doing errands, shopping? 0 0  Preparing Food and eating ? N N  Using the Toilet? N N  In the past six months, have you accidently leaked urine? Y Y  Do you have problems with loss of bowel control? N N  Managing your Medications? N N  Managing your Finances? N N  Housekeeping or managing your Housekeeping? N N    Patient Care Team: Alvan Dorothyann BIRCH, MD as PCP - General (Family Medicine)  Indicate any recent Medical  Services you may have received from other than [facility] providers in the past year (date may be approximate).     Assessment:   This is a routine wellness examination for Brittinee.  Hearing/Vision screen No results found.   Goals Addressed             This Visit's Progress    Patient Stated       Patient states she would like to get A1c down to 7.0.       Depression Screen    05/08/2023    1:37 PM 07/05/2022   10:58 AM 07/01/2021    3:57 PM 03/10/2021    9:41 AM 08/24/2020    9:26 AM 05/20/2020   11:24 AM 05/19/2020    8:53 AM  PHQ 2/9 Scores  PHQ - 2 Score 0 0 0 0 0 0 0    Fall Risk    07/06/2023    1:11 PM 07/04/2023    1:15 PM 05/08/2023    1:37 PM 07/05/2022   10:58 AM 03/29/2022    8:46 AM  Fall Risk   Falls in the past year? 1 1 0 0 0  Number falls in past yr: 0 0 0 0 0  Injury with Fall? 1 1 0 0 0  Risk for fall due to : History of fall(s)  No Fall Risks No Fall Risks No Fall Risks  Follow up Falls evaluation completed  Falls evaluation completed Falls evaluation completed Falls evaluation completed    MEDICARE RISK AT HOME: Medicare Risk at Home Any stairs in or around the home?: No If so, are there any without handrails?: No Home free of loose throw rugs in walkways, pet beds, electrical cords, etc?: Yes Adequate lighting in your home to reduce risk of falls?: Yes Life alert?: No Use of a cane, walker or w/c?: No Grab bars in the bathroom?: No Shower chair or bench in shower?: No Elevated toilet seat or a handicapped toilet?: No  TIMED UP AND GO:  Was the test performed?  No    Cognitive  Function:        07/06/2023    1:12 PM 07/05/2022   11:03 AM 07/01/2021    4:02 PM 05/20/2020   11:33 AM  6CIT Screen  What Year? 0 points 0 points 0 points 0 points  What month? 0 points 0 points 0 points 0 points  What time? 0 points 0 points 0 points 0 points  Count back from 20 0 points 0 points 0 points 0 points  Months in reverse 0 points 0 points 0 points 0  points  Repeat phrase 0 points 0 points 0 points 0 points  Total Score 0 points 0 points 0 points 0 points    Immunizations Immunization History  Administered Date(s) Administered   Td 01/10/2002    TDAP status: Due, Education has been provided regarding the importance of this vaccine. Advised may receive this vaccine at local pharmacy or Health Dept. Aware to provide a copy of the vaccination record if obtained from local pharmacy or Health Dept. Verbalized acceptance and understanding.  Flu Vaccine status: Declined, Education has been provided regarding the importance of this vaccine but patient still declined. Advised may receive this vaccine at local pharmacy or Health Dept. Aware to provide a copy of the vaccination record if obtained from local pharmacy or Health Dept. Verbalized acceptance and understanding.  Pneumococcal vaccine status: Declined,  Education has been provided regarding the importance of this vaccine but patient still declined. Advised may receive this vaccine at local pharmacy or Health Dept. Aware to provide a copy of the vaccination record if obtained from local pharmacy or Health Dept. Verbalized acceptance and understanding.   Covid-19 vaccine status: Declined, Education has been provided regarding the importance of this vaccine but patient still declined. Advised may receive this vaccine at local pharmacy or Health Dept.or vaccine clinic. Aware to provide a copy of the vaccination record if obtained from local pharmacy or Health Dept. Verbalized acceptance and understanding.  Qualifies for Shingles Vaccine? Yes   Zostavax completed No   Shingrix Completed?: No.    Education has been provided regarding the importance of this vaccine. Patient has been advised to call insurance company to determine out of pocket expense if they have not yet received this vaccine. Advised may also receive vaccine at local pharmacy or Health Dept. Verbalized acceptance and  understanding.  Screening Tests Health Maintenance  Topic Date Due   Pneumococcal Vaccine: 50+ Years (1 of 2 - PCV) Never done   Zoster Vaccines- Shingrix (1 of 2) 10/10/2023 (Originally [birth year]/1969)   DTaP/Tdap/Td (2 - Tdap) 05/07/2024 (Originally 01/11/2012)   COVID-19 Vaccine (1) 05/07/2024 (Originally 03/05/1955)   INFLUENZA VACCINE  08/11/2023   HEMOGLOBIN A1C  11/07/2023   Diabetic kidney evaluation - eGFR measurement  02/06/2024   Diabetic kidney evaluation - Urine ACR  02/06/2024   FOOT EXAM  05/07/2024   OPHTHALMOLOGY EXAM  06/27/2024   Medicare Annual Wellness (AWV)  07/05/2024   MAMMOGRAM  11/29/2024   Fecal DNA (Cologuard)  10/11/2025   DEXA SCAN  04/05/2032   Hepatitis C Screening  Completed   Hepatitis B Vaccines  Aged Out   HPV VACCINES  Aged Out   Meningococcal B Vaccine  Aged Out    Health Maintenance  Health Maintenance Due  Topic Date Due   Pneumococcal Vaccine: 50+ Years (1 of 2 - PCV) Never done    Colorectal cancer screening: Type of screening: Cologuard. Completed 10/12/2022. Repeat every 3 years  Mammogram status: Completed 11/30/22. Repeat every year  Bone Density status: Completed 04/06/2022. Results reflect: Bone density results: OSTEOPENIA. Repeat every 2 years.  Lung Cancer Screening: (Low Dose CT Chest recommended if Age 66-80 years, 20 pack-year currently smoking OR have quit w/in 15years.) does not qualify.   Lung Cancer Screening Referral: n/a  Additional Screening:  Hepatitis C Screening: does qualify; Completed 07/29/2014  Vision Screening: Recommended annual ophthalmology exams for early detection of glaucoma and other disorders of the eye. Is the patient up to date with their annual eye exam?  Yes  Who is the provider or what is the name of the office in which the patient attends annual eye exams? [facility] If pt is not established with a provider, would they like to be referred to a provider to establish care? nla.   Dental  Screening: Recommended annual dental exams for proper oral hygiene  Diabetic Foot Exam: Diabetic Foot Exam: Completed 05/08/2023  Community Resource Referral / Chronic Care Management: CRR required this visit?  No   CCM required this visit?  No     Plan:     I have personally reviewed and noted the following in the patient's chart:   Medical and social history Use of alcohol, tobacco or illicit drugs  Current medications and supplements including opioid prescriptions. Patient is not currently taking opioid prescriptions. Functional ability and status Nutritional status Physical activity Advanced directives List of other physicians Hospitalizations, surgeries, and ER visits in previous 12 months. None Vitals Screenings to include cognitive, depression, and falls Referrals and appointments  In addition, I have reviewed and discussed with patient certain preventive protocols, quality metrics, and best practice recommendations. A written personalized care plan for preventive services as well as general preventive health recommendations were provided to patient.     Bonny Jon Mayor, CMA   07/06/2023   After Visit Summary: (MyChart) Due to this being a telephonic visit, the after visit summary with patients personalized plan was offered to patient via MyChart   Nurse Notes:   Aubreyanna Dorrough is a 73 y.o. female patient of Metheney, Dorothyann BIRCH, MD who had a Medicare Annual Wellness Visit today via telephone. Kiasha is Retired and lives alone. she has 2 children. She reports that she is socially active and does interact with friends/family regularly. She is moderately physically active and enjoys doing crafts, reading and quilting.

## 2023-08-04 ENCOUNTER — Ambulatory Visit: Payer: Self-pay

## 2023-08-04 NOTE — Telephone Encounter (Addendum)
 FYI Only or Action Required?: FYI only for provider.  Patient was last seen in primary care on 05/08/2023 by Alvan Dorothyann BIRCH, MD.  Called Nurse Triage reporting Vaginal Itching.  Symptoms began several weeks ago.  Interventions attempted: Prescription medications: nystatin .  Symptoms are: unchanged.  Triage Disposition: See Physician Within 24 Hours  Patient/caregiver understands and will follow disposition?: Yes     Copied from CRM #[id number]. Topic: Clinical - Red Word Triage >> Aug 04, 2023  1:16 PM Mercer PEDLAR wrote: Red Word that prompted transfer to Nurse Triage: UTI due to medication patient is taking, xigduo  XR. Reason for Disposition  MODERATE-SEVERE itching (i.e., interferes with school, work, or sleep)  Answer Assessment - Initial Assessment Questions 1. SYMPTOM: What's the main symptom you're concerned about? (e.g., pain, itching, dryness)     Itching, pressure 2. LOCATION: Where is the  sx located? (e.g., inside/outside, left/right)     outside 3. ONSET: When did the  sx  start?     2 weeks ago - was previously nystatin  with minimal relief 4. PAIN: Is there any pain? If Yes, ask: How bad is it? (Scale: 1-10; mild, moderate, severe)     Endorses tenderness, esp when sitting 5. ITCHING: Is there any itching? If Yes, ask: How bad is it? (Scale: 1-10; mild, moderate, severe)     moderate 6. CAUSE: What do you think is causing the discharge? Have you had the same problem before? What happened then?     Thinks r/t Farxiga  side effect 7. OTHER SYMPTOMS: Do you have any other symptoms? (e.g., fever, itching, vaginal bleeding, pain with urination, injury to genital area, vaginal foreign body)     denies 8. PREGNANCY: Is there any chance you are pregnant? When was your last menstrual period?     N/a  Protocols used: Vaginal Symptoms-A-AH

## 2023-08-07 ENCOUNTER — Ambulatory Visit (INDEPENDENT_AMBULATORY_CARE_PROVIDER_SITE_OTHER): Admitting: Family Medicine

## 2023-08-07 ENCOUNTER — Encounter: Payer: Self-pay | Admitting: Family Medicine

## 2023-08-07 VITALS — BP 128/73 | HR 88 | Ht 67.0 in | Wt 180.0 lb

## 2023-08-07 DIAGNOSIS — B3731 Acute candidiasis of vulva and vagina: Secondary | ICD-10-CM | POA: Insufficient documentation

## 2023-08-07 DIAGNOSIS — L293 Anogenital pruritus, unspecified: Secondary | ICD-10-CM | POA: Diagnosis not present

## 2023-08-07 LAB — POCT URINALYSIS DIP (CLINITEK)
Bilirubin, UA: NEGATIVE
Glucose, UA: 500 mg/dL — AB
Leukocytes, UA: NEGATIVE
Nitrite, UA: NEGATIVE
POC PROTEIN,UA: NEGATIVE
Spec Grav, UA: 1.015 (ref 1.010–1.025)
Urobilinogen, UA: 0.2 U/dL
pH, UA: 5.5 (ref 5.0–8.0)

## 2023-08-07 MED ORDER — FLUCONAZOLE 150 MG PO TABS
150.0000 mg | ORAL_TABLET | Freq: Once | ORAL | 0 refills | Status: AC
Start: 2023-08-07 — End: 2023-08-07

## 2023-08-07 NOTE — Progress Notes (Signed)
 Janet Mccullough - 73 y.o. female MRN [id number]  Date of birth: [birth year]  Subjective Chief Complaint  Patient presents with   Vaginal Itching    HPI Janet Mccullough is a 73 y.o. female here today with complaint of vaginal itching.  She has had this for a few days.  She has had similar symptoms in the past but not as bad as current episodes.  Reports using nystatin  cream for this previously.   She is on SGLT-2 which she thinks may be contributing to these symptoms.   She denies pain, dysuria, flank pain, fever or chills.   ROS:  A comprehensive ROS was completed and negative except as noted per HPI  Allergies  Allergen Reactions   Moxifloxacin Hcl In Nacl Hives   Penicillins Hives    REACTION: unknown rx   Quinolones Hives    In particular Avelox   Semaglutide  Nausea Only and Other (See Comments)    Nausea and constipation on 7mg  dosing, nausea/indigestion episode when restarting 3mg  daily    Past Medical History:  Diagnosis Date   Allergy as child   Pennicillin, Avalox (few years ago)   Arthritis    Blood transfusion without reported diagnosis [phone]   During surgery   Cancer (HCC)    breast   GERD (gastroesophageal reflux disease) Late 80's   no current problems   Impaired fasting glucose    Microscopic hematuria    renal ultrasound normal (Dr Matilda)   Normal echocardiogram    EF 60-65%    Past Surgical History:  Procedure Laterality Date   ABDOMINAL HYSTERECTOMY  07/2001   for DUB/ cysts   BREAST SURGERY  1987   Mastectomy - left   MASTECTOMY Left    TUBAL LIGATION  1979   uterine cyst removed  05/1997    Social History   Socioeconomic History   Marital status: Widowed    Spouse name: Not on file   Number of children: 2   Years of education: 68   Highest education level: Some college, no degree  Occupational History    Comment: Retired  Tobacco Use   Smoking status: Never   Smokeless tobacco: Never  Substance and Sexual Activity   Alcohol use: Not  Currently    Comment: Occasionally   Drug use: Never   Sexual activity: Not Currently    Birth control/protection: None  Other Topics Concern   Not on file  Social History Narrative   Lives alone. She has two children of her own and two step children. She enjoys doing crafts, read and learning how to quilt.   Social Drivers of Corporate investment banker Strain: Low Risk  (07/06/2023)   Overall Financial Resource Strain (CARDIA)    Difficulty of Paying Living Expenses: Not hard at all  Food Insecurity: No Food Insecurity (07/06/2023)   Hunger Vital Sign    Worried About Running Out of Food in the Last Year: Never true    Ran Out of Food in the Last Year: Never true  Transportation Needs: No Transportation Needs (07/06/2023)   PRAPARE - Administrator, Civil Service (Medical): No    Lack of Transportation (Non-Medical): No  Physical Activity: Insufficiently Active (07/06/2023)   Exercise Vital Sign    Days of Exercise per Week: 3 days    Minutes of Exercise per Session: 10 min  Stress: No Stress Concern Present (07/06/2023)   [employer] of Occupational Health - Occupational Stress Questionnaire    Feeling  of Stress: Not at all  Social Connections: Moderately Integrated (07/06/2023)   Social Connection and Isolation Panel    Frequency of Communication with Friends and Family: More than three times a week    Frequency of Social Gatherings with Friends and Family: More than three times a week    Attends Religious Services: More than 4 times per year    Active Member of [employer] or Organizations: Yes    Attends Banker Meetings: More than 4 times per year    Marital Status: Widowed    Family History  Problem Relation Age of Onset   Diabetes Mother    Diabetes Father    Heart disease Father    Stroke Father    Diabetes Brother    Diabetes Brother        type 2   ADD / ADHD Son     Health Maintenance  Topic Date Due   Pneumococcal Vaccine: 50+  Years (1 of 2 - PCV) Never done   Zoster Vaccines- Shingrix (1 of 2) 10/10/2023 (Originally [birth year]/1969)   DTaP/Tdap/Td (2 - Tdap) 05/07/2024 (Originally 01/11/2012)   COVID-19 Vaccine (1) 05/07/2024 (Originally 03/05/1955)   INFLUENZA VACCINE  08/11/2023   HEMOGLOBIN A1C  11/07/2023   Diabetic kidney evaluation - eGFR measurement  02/06/2024   Diabetic kidney evaluation - Urine ACR  02/06/2024   FOOT EXAM  05/07/2024   OPHTHALMOLOGY EXAM  06/27/2024   Medicare Annual Wellness (AWV)  07/05/2024   MAMMOGRAM  11/29/2024   Fecal DNA (Cologuard)  10/11/2025   DEXA SCAN  04/05/2032   Hepatitis C Screening  Completed   Hepatitis B Vaccines  Aged Out   HPV VACCINES  Aged Out   Meningococcal B Vaccine  Aged Out     ----------------------------------------------------------------------------------------------------------------------------------------------------------------------------------------------------------------- Physical Exam BP 128/73 (BP Location: Right Arm, Patient Position: Sitting, Cuff Size: Large)   Pulse 88   Ht 5' 7 (1.702 m)   Wt 180 lb (81.6 kg)   SpO2 97%   BMI 28.19 kg/m   Physical Exam Constitutional:      Appearance: Normal appearance.  Eyes:     General: No scleral icterus. Cardiovascular:     Rate and Rhythm: Normal rate and regular rhythm.  Pulmonary:     Effort: Pulmonary effort is normal.     Breath sounds: Normal breath sounds.  Musculoskeletal:     Cervical back: Neck supple.  Neurological:     Mental Status: She is alert.  Psychiatric:        Mood and Affect: Mood normal.        Behavior: Behavior normal.     ------------------------------------------------------------------------------------------------------------------------------------------------------------------------------------------------------------------- Assessment and Plan  Yeast vaginitis UA without signs of UTI.  Wet prep sent.  Will go ahead an cover empirically with diflucan   with her history and current symptoms. Red flags reviewed.    Meds ordered this encounter  Medications   fluconazole  (DIFLUCAN ) 150 MG tablet    Sig: Take 1 tablet (150 mg total) by mouth once for 1 dose. Repeat in 72 hours.    Dispense:  2 tablet    Refill:  0    No follow-ups on file.

## 2023-08-07 NOTE — Assessment & Plan Note (Addendum)
 UA without signs of UTI.  Wet prep sent.  Will go ahead an cover empirically with diflucan  with her history and current symptoms. Red flags reviewed.

## 2023-08-08 LAB — WET PREP FOR TRICH, YEAST, CLUE
Clue Cell Exam: NEGATIVE
Trichomonas Exam: NEGATIVE
Yeast Exam: POSITIVE — AB

## 2023-08-10 ENCOUNTER — Encounter (INDEPENDENT_AMBULATORY_CARE_PROVIDER_SITE_OTHER): Payer: Self-pay | Admitting: Sports Medicine

## 2023-08-10 ENCOUNTER — Ambulatory Visit: Payer: Self-pay | Admitting: Medical-Surgical

## 2023-08-10 DIAGNOSIS — M79661 Pain in right lower leg: Secondary | ICD-10-CM

## 2023-08-10 DIAGNOSIS — M7989 Other specified soft tissue disorders: Secondary | ICD-10-CM

## 2023-08-10 NOTE — Telephone Encounter (Signed)

## 2023-08-16 ENCOUNTER — Encounter: Payer: Self-pay | Admitting: Medical-Surgical

## 2023-08-16 MED ORDER — CLOTRIMAZOLE-BETAMETHASONE 1-0.05 % EX CREA: 1.0000 | TOPICAL_CREAM | Freq: Two times a day (BID) | CUTANEOUS | 0 refills | Status: AC

## 2023-08-16 NOTE — Telephone Encounter (Signed)
 NO, its just the default address we have in the system. Thanks

## 2023-08-17 ENCOUNTER — Other Ambulatory Visit: Payer: Self-pay | Admitting: Family Medicine

## 2023-08-17 DIAGNOSIS — E119 Type 2 diabetes mellitus without complications: Secondary | ICD-10-CM

## 2023-09-04 DIAGNOSIS — M7989 Other specified soft tissue disorders: Secondary | ICD-10-CM | POA: Diagnosis not present

## 2023-09-04 DIAGNOSIS — M79662 Pain in left lower leg: Secondary | ICD-10-CM | POA: Diagnosis not present

## 2023-09-04 DIAGNOSIS — I83813 Varicose veins of bilateral lower extremities with pain: Secondary | ICD-10-CM | POA: Diagnosis not present

## 2023-09-04 DIAGNOSIS — I83893 Varicose veins of bilateral lower extremities with other complications: Secondary | ICD-10-CM | POA: Diagnosis not present

## 2023-09-04 DIAGNOSIS — M79604 Pain in right leg: Secondary | ICD-10-CM | POA: Diagnosis not present

## 2023-09-04 DIAGNOSIS — M79661 Pain in right lower leg: Secondary | ICD-10-CM | POA: Diagnosis not present

## 2023-09-07 ENCOUNTER — Encounter: Payer: Self-pay | Admitting: Family Medicine

## 2023-09-07 ENCOUNTER — Other Ambulatory Visit: Payer: Self-pay | Admitting: Family Medicine

## 2023-09-07 ENCOUNTER — Ambulatory Visit (INDEPENDENT_AMBULATORY_CARE_PROVIDER_SITE_OTHER): Admitting: Family Medicine

## 2023-09-07 VITALS — BP 131/71 | HR 63 | Ht 67.0 in | Wt 177.1 lb

## 2023-09-07 DIAGNOSIS — L989 Disorder of the skin and subcutaneous tissue, unspecified: Secondary | ICD-10-CM

## 2023-09-07 DIAGNOSIS — E119 Type 2 diabetes mellitus without complications: Secondary | ICD-10-CM | POA: Diagnosis not present

## 2023-09-07 DIAGNOSIS — R809 Proteinuria, unspecified: Secondary | ICD-10-CM | POA: Diagnosis not present

## 2023-09-07 DIAGNOSIS — E1129 Type 2 diabetes mellitus with other diabetic kidney complication: Secondary | ICD-10-CM | POA: Diagnosis not present

## 2023-09-07 DIAGNOSIS — T50905A Adverse effect of unspecified drugs, medicaments and biological substances, initial encounter: Secondary | ICD-10-CM

## 2023-09-07 DIAGNOSIS — Z7984 Long term (current) use of oral hypoglycemic drugs: Secondary | ICD-10-CM

## 2023-09-07 DIAGNOSIS — C44722 Squamous cell carcinoma of skin of right lower limb, including hip: Secondary | ICD-10-CM | POA: Diagnosis not present

## 2023-09-07 LAB — POCT GLYCOSYLATED HEMOGLOBIN (HGB A1C): Hemoglobin A1C: 8.2 % — AB (ref 4.0–5.6)

## 2023-09-07 MED ORDER — METFORMIN HCL ER 500 MG PO TB24: 500.0000 mg | ORAL_TABLET | Freq: Two times a day (BID) | ORAL | 1 refills | Status: DC

## 2023-09-07 MED ORDER — GLIPIZIDE ER 2.5 MG PO TB24: 2.5000 mg | ORAL_TABLET | Freq: Every day | ORAL | 1 refills | Status: DC

## 2023-09-07 NOTE — Progress Notes (Signed)
 Established Patient Office Visit  Subjective  Patient ID: Janet Mccullough, female    DOB: 1950-08-22  Age: 73 y.o. MRN: [id number]  Chief Complaint  Patient presents with   Diabetes    HPI  Discussed the use of AI scribe software for clinical note transcription with the patient, who gave verbal consent to proceed.  History of Present Illness Janet Mccullough is a 73 year old female who presents with concerns about a persistent bump on her leg.  Cutaneous lesion of the right leg - Persistent hard, tender bump on the leg - Attributed to a scratch from her grandson's dog - Attempts to lance the lesion resulted in bleeding only, without pus - Sensation of something under the skin - Concerned about the persistent nature of the lesion  Recurrent vulvovaginal candidiasis - History of recurrent yeast infections, with a particularly difficult episode in July - Initial use of prescribed topical antifungal cream without symptom relief - Vaginal swab performed during the episode - Treated with two doses of oral Diflucan  without resolution of symptoms - Discontinued Xigduo  at the end of July, suspecting it contributed to the infection - Prescribed a different topical antifungal cream, used for over a week, resulting in resolution of infection  Diabetes mellitus management - Increased physical activity and adherence to a low-carbohydrate meal plan, keeping carbohydrate intake below 60 grams per day - Discontinued Xigduo  at the end of July and has not resumed it - Considering alternative medication options for diabetes management, without urgency to refill previous prescription       ROS    Objective:     BP 131/71   Pulse 63   Ht 5' 7 (1.702 m)   Wt 177 lb 1.9 oz (80.3 kg)   SpO2 100%   BMI 27.74 kg/m     Physical Exam Vitals reviewed.  Constitutional:      Appearance: Normal appearance.  HENT:     Head: Normocephalic.  Pulmonary:     Effort: Pulmonary effort is normal.   Neurological:     Mental Status: She is alert and oriented to person, place, and time.  Psychiatric:        Mood and Affect: Mood normal.        Behavior: Behavior normal.      Results for orders placed or performed in visit on 09/07/23  POCT HgB A1C  Result Value Ref Range   Hemoglobin A1C 8.2 (A) 4.0 - 5.6 %   HbA1c POC (<> result, manual entry)     HbA1c, POC (prediabetic range)     HbA1c, POC (controlled diabetic range)         The 10-year ASCVD risk score (Arnett DK, et al., 2019) is: 24.4%    Assessment & Plan:   Problem List Items Addressed This Visit       Endocrine   Microalbuminuria due to type 2 diabetes mellitus (HCC)   Relevant Medications   metFORMIN  (GLUCOPHAGE -XR) 500 MG 24 hr tablet   glipiZIDE  (GLUCOTROL  XL) 2.5 MG 24 hr tablet   Other Visit Diagnoses       Leg skin lesion, right    -  Primary   Relevant Orders   Surgical pathology     Controlled type 2 diabetes mellitus without complication, without long-term current use of insulin (HCC)       Relevant Medications   metFORMIN  (GLUCOPHAGE -XR) 500 MG 24 hr tablet   glipiZIDE  (GLUCOTROL  XL) 2.5 MG 24 hr tablet   Other Relevant  Orders   POCT HgB A1C (Completed)   CMP14+EGFR     Medication side effect, initial encounter           Assessment and Plan Assessment & Plan Type 2 diabetes mellitus with medication intolerance and adjustment Intolerance to SGLT2 inhibitors due to yeast infections. Considered glipizide  and Januvia with metformin . Glipizide  preferred for its effectiveness despite hypoglycemia risk. - Add metformin  to the treatment regimen. - Initiate glipizide  at 2.5 mg, monitor for hypoglycemia. - Increase glipizide  to 5 mg if well tolerated and needed. - Monitor blood glucose levels regularly. - Educate on signs of hypoglycemia and dietary considerations.  Recurrent vulvovaginal candidiasis Yeast infection treated with Diflucan  and cream. Discontinued Xigduo  due to infection risk  with SGLT2 inhibitors. - Add SGLT2 inhibitors to the medication intolerance list.  Suspected skin lesion of right leg, possible basal cell carcinoma versus dermatofibroma Lesion on left leg possibly basal cell carcinoma or dermatofibroma. Recommended shave biopsy for diagnosis. - Perform shave biopsy of the lesion on the right leg. - Send biopsy sample to the lab for analysis. - Apply aluminum chloride post-procedure to control bleeding. - Instruct to apply Vaseline twice daily for 2-3 weeks. - Advise to keep the wound covered for the first 1-2 days. - Monitor for signs of infection and report any concerns.  Skin excision  Date/Time: 09/07/2023 1:38 PM  Performed by: Alvan Dorothyann BIRCH, MD Authorized by: Alvan Dorothyann BIRCH, MD   Number of Lesions: 1 Lesion 1:    Body area: lower extremity   Lower extremity location: R lower leg   Initial size (mm): 8   Malignancy: malignancy unknown     Destruction method comment: Shave  Shave Biopsy Procedure Note  Pre-operative Diagnosis: Suspicious lesion  Post-operative Diagnosis: same  Locations:right lower leg  Indications:  not healing   Anesthesia: Lidocaine  1% without epinephrine without added sodium bicarbonate  Procedure Details   Patient informed of the risks (including bleeding and infection) and benefits of the  procedure and Verbal informed consent obtained.  The lesion and surrounding area were given a sterile prep using chlorhexidine and draped in the usual sterile fashion. A scalpel was used to shave an area of skin approximately 9mm by 9mm.  Hemostasis achieved with alumuninum chloride. Antibiotic ointment and a sterile dressing applied.  The specimen was sent for pathologic examination. The patient tolerated the procedure well.  ZAO:umjrz  Findings: Await patholgy   Condition: Stable  Complications: none.  Plan: 1. Instructed to keep the wound dry and covered for 24-48h and clean thereafter. 2. Warning  signs of infection were reviewed.   3. Recommended that the patient use OTC acetaminophen as needed for pain.   Return in about 4 months (around 01/07/2024) for DM.    Dorothyann Alvan, MD

## 2023-09-07 NOTE — Addendum Note (Signed)
 Addended by: FREYA BASCOM CROME on: 09/07/2023 05:12 PM   Modules accepted: Orders

## 2023-09-08 LAB — CMP14+EGFR
ALT: 14 IU/L (ref 0–32)
AST: 10 IU/L (ref 0–40)
Albumin: 4.4 g/dL (ref 3.8–4.8)
Alkaline Phosphatase: 72 IU/L (ref 44–121)
BUN/Creatinine Ratio: 23 (ref 12–28)
BUN: 16 mg/dL (ref 8–27)
Bilirubin Total: 0.5 mg/dL (ref 0.0–1.2)
CO2: 23 mmol/L (ref 20–29)
Calcium: 9.5 mg/dL (ref 8.7–10.3)
Chloride: 99 mmol/L (ref 96–106)
Creatinine, Ser: 0.71 mg/dL (ref 0.57–1.00)
Globulin, Total: 2.5 g/dL (ref 1.5–4.5)
Glucose: 190 mg/dL — ABNORMAL HIGH (ref 70–99)
Potassium: 4.6 mmol/L (ref 3.5–5.2)
Sodium: 137 mmol/L (ref 134–144)
Total Protein: 6.9 g/dL (ref 6.0–8.5)
eGFR: 90 mL/min/1.73 (ref >59)

## 2023-09-12 ENCOUNTER — Encounter: Payer: Self-pay | Admitting: Sports Medicine

## 2023-09-13 ENCOUNTER — Ambulatory Visit: Payer: Self-pay | Admitting: Family Medicine

## 2023-09-13 DIAGNOSIS — D0471 Carcinoma in situ of skin of right lower limb, including hip: Secondary | ICD-10-CM

## 2023-09-13 LAB — DERMATOLOGY PATHOLOGY

## 2023-09-13 NOTE — Progress Notes (Signed)
 HI Janet Mccullough, your biopsy reports shows the lesion we took off is a squamous cell skin cancer. So we need to refer you to dermatology. I will refer you here locally in [place].

## 2023-09-14 ENCOUNTER — Telehealth: Payer: Self-pay | Admitting: Family Medicine

## 2023-09-14 NOTE — Telephone Encounter (Signed)
 Copied from CRM [id number]. Topic: Referral - Question >> Sep 14, 2023 11:55 AM Janet Mccullough wrote: Reason for CRM: Pt called in and asked if the referral that was just placed yesterday for dermatology could be sent over to [facility] in [facility] (office ph [phone]) where she is currently being seen for her arm. She also asked if a new referral was needed since she is already being seen there. Please follow up

## 2023-10-10 ENCOUNTER — Telehealth: Payer: Self-pay | Admitting: Family Medicine

## 2023-10-10 NOTE — Telephone Encounter (Signed)
Thank you  For your help!

## 2023-10-10 NOTE — Telephone Encounter (Signed)
 Copied from CRM #[id number]. Topic: Referral - Status >> Oct 10, 2023 11:35 AM Amy B wrote: Reason for CRM: Patient states referral to  [facility]  has not been received.  She asks that it be refaxed to [phone] along with her pathology report which they are requiring.

## 2023-10-17 ENCOUNTER — Telehealth: Payer: Self-pay

## 2023-10-17 NOTE — Telephone Encounter (Signed)
 Copied from CRM [id number]. Topic: Referral - Question >> Oct 16, 2023  4:00 PM Delon DASEN wrote: Reason for CRM: [facility] needs copy of biopsy and pathology report before they schedule

## 2023-10-18 ENCOUNTER — Other Ambulatory Visit: Payer: Self-pay | Admitting: Family Medicine

## 2023-10-18 DIAGNOSIS — Z1231 Encounter for screening mammogram for malignant neoplasm of breast: Secondary | ICD-10-CM

## 2023-10-18 NOTE — Telephone Encounter (Signed)
 Results sent to [facility]

## 2023-10-24 ENCOUNTER — Telehealth: Payer: Self-pay

## 2023-10-24 NOTE — Telephone Encounter (Signed)
 Copied from CRM #[id number]. Topic: General - Other >> Oct 24, 2023 12:42 PM Aleatha C wrote: Reason for CRM: [facility] calling to recommending a statin to reduce cardo risk, call back [phone]

## 2023-10-25 NOTE — Telephone Encounter (Signed)
 I have asked and recommended mulitple tims and she has declined.

## 2023-11-21 DIAGNOSIS — C44722 Squamous cell carcinoma of skin of right lower limb, including hip: Secondary | ICD-10-CM | POA: Diagnosis not present

## 2023-11-28 ENCOUNTER — Telehealth: Payer: Self-pay | Admitting: Pharmacist

## 2023-11-28 NOTE — Progress Notes (Signed)
 Pharmacy Quality Measure Review  This patient is appearing on the insurance-providing list for being at risk of failing the adherence measure for Statin Use in Persons with Diabetes (SUPD) medications this calendar year.   PREVENT Risk Score: 10 year risk of CVD: 8.1% - 10 year risk of ASCVD: 5.3% - 10 year risk of HF: 4.5%  Moderate intensity statin recommended. Consider at future appointments.   Catie [facility]AR [facility], PharmD, [facility] Clinical Pharmacist [phone]

## 2023-11-29 ENCOUNTER — Other Ambulatory Visit: Payer: Self-pay | Admitting: Family Medicine

## 2023-11-29 DIAGNOSIS — E119 Type 2 diabetes mellitus without complications: Secondary | ICD-10-CM

## 2023-12-28 ENCOUNTER — Ambulatory Visit: Admitting: Family Medicine

## 2023-12-28 VITALS — BP 126/57 | HR 87 | Ht 67.0 in | Wt 186.1 lb

## 2023-12-28 DIAGNOSIS — E119 Type 2 diabetes mellitus without complications: Secondary | ICD-10-CM | POA: Diagnosis not present

## 2023-12-28 DIAGNOSIS — Z7984 Long term (current) use of oral hypoglycemic drugs: Secondary | ICD-10-CM | POA: Diagnosis not present

## 2023-12-28 DIAGNOSIS — E1129 Type 2 diabetes mellitus with other diabetic kidney complication: Secondary | ICD-10-CM

## 2023-12-28 DIAGNOSIS — N898 Other specified noninflammatory disorders of vagina: Secondary | ICD-10-CM

## 2023-12-28 DIAGNOSIS — R809 Proteinuria, unspecified: Secondary | ICD-10-CM

## 2023-12-28 DIAGNOSIS — Z85820 Personal history of malignant melanoma of skin: Secondary | ICD-10-CM | POA: Diagnosis not present

## 2023-12-28 LAB — POCT GLYCOSYLATED HEMOGLOBIN (HGB A1C): Hemoglobin A1C: 7.3 % — AB (ref 4.0–5.6)

## 2023-12-28 NOTE — Assessment & Plan Note (Signed)
 Improved glycemic control with A1c reduced to 7.3%. Current regimen includes metformin  and glipizide . Farxiga  discontinued due to yeast infection. She is limiting carbohydrates and exercising. - Increase glipizide  to 5 mg daily for two weeks. - Monitor blood glucose for hypoglycemia. - Continue metformin  and lifestyle modifications. - Reassess A1c in a few months. - declines ACE/ARB

## 2023-12-28 NOTE — Assessment & Plan Note (Signed)
 Follows with Dermatology, last seen in Jan

## 2023-12-28 NOTE — Progress Notes (Signed)
 Established Patient Office Visit  Patient ID: Janet Mccullough, female    DOB: 07/10/1950  Age: 73 y.o. MRN: [id number] PCP: Alvan Dorothyann BIRCH, MD  Chief Complaint  Patient presents with   Diabetes    Subjective:     HPI  Discussed the use of AI scribe software for clinical note transcription with the patient, who gave verbal consent to proceed.  History of Present Illness Janet Mccullough is a 73 year old female with type 2 diabetes who presents for follow-up of her blood sugar management and recurrent yeast infections.  Glycemic control - Hemoglobin A1c was 7.3% on most recent testing; previous values were 8.2% in August and 6.9% in April - Currently taking metformin  and glipizide ; glipizide  initiated after discontinuing Farxiga  in August (b/o of yeast infxn)  - Monitors carbohydrate intake and has established an indoor exercise routine, though recent holiday season has disrupted her schedule - Received a letter from insurance regarding a change in glucose monitoring device; plans to continue using current supplies until depleted  Recurrent vulvovaginal candidiasis - History of recurrent yeast infections, associated with prior use of Farxiga  - Severe yeast infection in August led to discontinuation of Farxiga  - Uses a stronger topical antifungal cream, which provides temporary relief; symptoms recur every few weeks - No use of antifungal cream today  Preventive health maintenance - Mammogram has been scheduled     ROS    Objective:     BP (!) 126/57   Pulse 87   Ht 5' 7 (1.702 m)   Wt 186 lb 1.6 oz (84.4 kg)   SpO2 100%   BMI 29.15 kg/m    Physical Exam Vitals and nursing note reviewed.  Constitutional:      Appearance: Normal appearance.  HENT:     Head: Normocephalic and atraumatic.  Eyes:     Conjunctiva/sclera: Conjunctivae normal.  Cardiovascular:     Rate and Rhythm: Normal rate and regular rhythm.  Pulmonary:     Effort: Pulmonary effort is  normal.     Breath sounds: Normal breath sounds.  Skin:    General: Skin is warm and dry.  Neurological:     Mental Status: She is alert.  Psychiatric:        Mood and Affect: Mood normal.      Results for orders placed or performed in visit on 12/28/23  POCT HgB A1C  Result Value Ref Range   Hemoglobin A1C 7.3 (A) 4.0 - 5.6 %   HbA1c POC (<> result, manual entry)     HbA1c, POC (prediabetic range)     HbA1c, POC (controlled diabetic range)        The 10-year ASCVD risk score (Arnett DK, et al., 2019) is: 22.8%    Assessment & Plan:   Problem List Items Addressed This Visit       Endocrine   Microalbuminuria due to type 2 diabetes mellitus (HCC)   Improved glycemic control with A1c reduced to 7.3%. Current regimen includes metformin  and glipizide . Farxiga  discontinued due to yeast infection. She is limiting carbohydrates and exercising. - Increase glipizide  to 5 mg daily for two weeks. - Monitor blood glucose for hypoglycemia. - Continue metformin  and lifestyle modifications. - Reassess A1c in a few months. - declines ACE/ARB      Relevant Orders   POCT HgB A1C (Completed)   Urine Microalbumin w/creat. ratio     Other   History of melanoma   Follows with Dermatology, last seen in Jan  Other Visit Diagnoses       Controlled type 2 diabetes mellitus without complication, without long-term current use of insulin (HCC)    -  Primary   Relevant Orders   POCT HgB A1C (Completed)   Urine Microalbumin w/creat. ratio     Vaginal itching       Relevant Orders   NuSwab Vaginitis Plus (VG+)       Assessment and Plan Assessment & Plan Recurrent vulvovaginal candidiasis Recurrent episodes with recent flare-up. Previous treatment provided temporary relief. Further evaluation needed to identify yeast type. - Performed Aptima test to identify yeast type. - Monitor symptoms and treatment response.  General health maintenance Eye exam satisfactory. Mammogram  scheduled. - Document eye exam results in medical record. - Proceed with scheduled mammogram.    Return in about 3 months (around 04/03/2024) for Diabetes follow-up.    Dorothyann Byars, MD [facility] at [facility]

## 2023-12-28 NOTE — Patient Instructions (Signed)
 Increase Glipizide  to 5mg  daily

## 2023-12-29 ENCOUNTER — Other Ambulatory Visit: Payer: Self-pay | Admitting: Family Medicine

## 2023-12-29 ENCOUNTER — Ambulatory Visit

## 2023-12-29 DIAGNOSIS — Z1231 Encounter for screening mammogram for malignant neoplasm of breast: Secondary | ICD-10-CM

## 2023-12-29 LAB — MICROALBUMIN / CREATININE URINE RATIO
Creatinine, Urine: 51.8 mg/dL
Microalb/Creat Ratio: <6 mg/g{creat} (ref 0–29)
Microalbumin, Urine: <3 ug/mL

## 2023-12-29 LAB — LIPID PANEL WITH LDL/HDL RATIO

## 2023-12-29 LAB — SPECIMEN STATUS REPORT

## 2024-01-01 ENCOUNTER — Ambulatory Visit: Payer: Self-pay | Admitting: Family Medicine

## 2024-01-01 LAB — NUSWAB VAGINITIS PLUS (VG+)
Candida albicans, NAA: NEGATIVE
Candida glabrata, NAA: NEGATIVE

## 2024-01-01 NOTE — Progress Notes (Signed)
 Hi Von, swab was negative for any yeast or bacterial overgrowth.  Times itching and irritation can be caused by dryness so encourage you to consider using a vaginal moisturizer such as coconut oil or Replens.

## 2024-01-05 ENCOUNTER — Ambulatory Visit: Payer: Self-pay | Admitting: Family Medicine

## 2024-01-05 NOTE — Progress Notes (Signed)
 Please call patient. Normal mammogram.  Repeat in 1 year.

## 2024-01-08 ENCOUNTER — Telehealth: Payer: Self-pay

## 2024-01-08 NOTE — Telephone Encounter (Signed)
 Copied from CRM #[id number]. Topic: Clinical - Medication Question >> Jan 08, 2024 11:27 AM Delon DASEN wrote: Reason for CRM: Jacquline with [facility] is recommending a statin be added to medcations to prevent heart attack or stroke- [phone]

## 2024-01-31 ENCOUNTER — Other Ambulatory Visit: Payer: Self-pay

## 2024-01-31 ENCOUNTER — Encounter: Payer: Self-pay | Admitting: Family Medicine

## 2024-01-31 DIAGNOSIS — E119 Type 2 diabetes mellitus without complications: Secondary | ICD-10-CM

## 2024-01-31 MED ORDER — GLIPIZIDE ER 5 MG PO TB24: 5.0000 mg | ORAL_TABLET | Freq: Every day | ORAL | 1 refills | Status: AC

## 2024-01-31 MED ORDER — METFORMIN HCL ER 500 MG PO TB24: 500.0000 mg | ORAL_TABLET | Freq: Two times a day (BID) | ORAL | 1 refills | Status: AC

## 2024-01-31 NOTE — Telephone Encounter (Signed)
 Meds ordered this encounter  Medications   glipiZIDE  (GLUCOTROL  XL) 5 MG 24 hr tablet    Sig: Take 1 tablet (5 mg total) by mouth daily with breakfast.    Dispense:  90 tablet    Refill:  1

## 2024-04-04 ENCOUNTER — Ambulatory Visit: Admitting: Family Medicine

## 2024-07-09 ENCOUNTER — Ambulatory Visit
